# Patient Record
Sex: Female | Born: 1975 | Race: White | Hispanic: No | Marital: Married | Smoking: Current every day smoker
Health system: Southern US, Community
[De-identification: ages and names within clinical notes are randomized; demographics above are authoritative.]

## PROBLEM LIST (undated history)

## (undated) DIAGNOSIS — I82409 Acute embolism and thrombosis of unspecified deep veins of unspecified lower extremity: Secondary | ICD-10-CM

## (undated) DIAGNOSIS — M419 Scoliosis, unspecified: Secondary | ICD-10-CM

## (undated) DIAGNOSIS — N390 Urinary tract infection, site not specified: Secondary | ICD-10-CM

## (undated) DIAGNOSIS — G43909 Migraine, unspecified, not intractable, without status migrainosus: Secondary | ICD-10-CM

## (undated) DIAGNOSIS — I493 Ventricular premature depolarization: Secondary | ICD-10-CM

## (undated) HISTORY — PX: CERVICAL CONE BIOPSY: SUR198

## (undated) HISTORY — PX: OTHER SURGICAL HISTORY: SHX169

## (undated) HISTORY — PX: TUBAL LIGATION: SHX77

## (undated) HISTORY — PX: TYMPANOSTOMY TUBE PLACEMENT: SHX32

---

## 2016-05-06 DIAGNOSIS — B373 Candidiasis of vulva and vagina: Secondary | ICD-10-CM | POA: Insufficient documentation

## 2016-05-06 DIAGNOSIS — Z9622 Myringotomy tube(s) status: Secondary | ICD-10-CM | POA: Insufficient documentation

## 2016-05-06 DIAGNOSIS — N390 Urinary tract infection, site not specified: Secondary | ICD-10-CM | POA: Insufficient documentation

## 2016-05-06 DIAGNOSIS — Z86718 Personal history of other venous thrombosis and embolism: Secondary | ICD-10-CM | POA: Insufficient documentation

## 2016-05-06 LAB — URINALYSIS COMPLETE WITH MICROSCOPIC (ARMC ONLY)
Bilirubin Urine: NEGATIVE
GLUCOSE, UA: NEGATIVE mg/dL
Ketones, ur: NEGATIVE mg/dL
Leukocytes, UA: NEGATIVE
Nitrite: NEGATIVE
Protein, ur: NEGATIVE mg/dL
Specific Gravity, Urine: 1.021 (ref 1.005–1.030)
pH: 5 (ref 5.0–8.0)

## 2016-05-06 NOTE — ED Notes (Signed)
Patient states she has a urinary tract infection and that she has them regularly and knows that this is what it is. Patient with urgency, frequency and dysuria. Husband states that he thinks she might have a yeast infection also. 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

## 2016-05-07 ENCOUNTER — Emergency Department
Admission: EM | Admit: 2016-05-07 | Discharge: 2016-05-07 | Disposition: A | Discharge status: Home / Self Care | Payer: Self-pay | Attending: Emergency Medicine | Admitting: Emergency Medicine

## 2016-05-07 DIAGNOSIS — N390 Urinary tract infection, site not specified: Secondary | ICD-10-CM

## 2016-05-07 DIAGNOSIS — B373 Candidiasis of vulva and vagina: Secondary | ICD-10-CM

## 2016-05-07 DIAGNOSIS — B3731 Acute candidiasis of vulva and vagina: Secondary | ICD-10-CM

## 2016-05-07 HISTORY — DX: Acute embolism and thrombosis of unspecified deep veins of unspecified lower extremity: I82.409

## 2016-05-07 HISTORY — DX: Ventricular premature depolarization: I49.3

## 2016-05-07 HISTORY — DX: Urinary tract infection, site not specified: N39.0

## 2016-05-07 HISTORY — DX: Scoliosis, unspecified: M41.9

## 2016-05-07 HISTORY — DX: Migraine, unspecified, not intractable, without status migrainosus: G43.909

## 2016-05-07 MED ORDER — CEPHALEXIN 500 MG PO CAPS: 500.0000 mg | ORAL_CAPSULE | Freq: Two times a day (BID) | ORAL | Status: AC

## 2016-05-07 MED ORDER — FLUCONAZOLE 50 MG PO TABS
150.0000 mg | ORAL_TABLET | Freq: Once | ORAL | Status: AC
  Administered 2016-05-07: 150 mg via ORAL
  Filled 2016-05-07: qty 1

## 2016-05-07 MED ORDER — LORAZEPAM 0.5 MG PO TABS
0.5000 mg | ORAL_TABLET | Freq: Once | ORAL | Status: AC
  Administered 2016-05-07: 0.5 mg via ORAL
  Filled 2016-05-07: qty 1

## 2016-05-07 MED ORDER — CEPHALEXIN 500 MG PO CAPS
500.0000 mg | ORAL_CAPSULE | Freq: Once | ORAL | Status: AC
  Administered 2016-05-07: 500 mg via ORAL
  Filled 2016-05-07: qty 1

## 2016-05-07 NOTE — ED Provider Notes (Signed)
[facility] Emergency Department Provider Note  ____________________________________________  Time seen: 2:45 AM   I have reviewed the triage vital signs and the nursing notes.   HISTORY  Chief Complaint Urinary Tract Infection      HPI [name] Evans is a 40 y.o. female presents with dysuria and urinary urgency and frequency times "few days". Patient states that she's had multiple urinary tract infections in the past and this is consistent with that. Patient also states that she has a yeast infection    Past Medical History  Diagnosis Date  . Frequent UTI   . Scoliosis   . DVT (deep venous thrombosis) (HCC)   . Frequent PVCs   . Migraines     There are no active problems to display for this patient.   Past Surgical History  Procedure Laterality Date  . Tubal ligation    . Uterine ablation    . Ivc filter, removed    . Cervical cone biopsy    . Tympanostomy tube placement      Current Outpatient Rx  Name  Route  Sig  Dispense  Refill  . cephALEXin (KEFLEX) 500 MG capsule   Oral   Take 1 capsule (500 mg total) by mouth 2 (two) times daily.   14 capsule   0     Allergies Tramadol  No family history on file.  Social History Social History  Substance Use Topics  . Smoking status: None  . Smokeless tobacco: None  . Alcohol Use: None    Review of Systems  Constitutional: Negative for fever. Eyes: Negative for visual changes. ENT: Negative for sore throat. Cardiovascular: Negative for chest pain. Respiratory: Negative for shortness of breath. Gastrointestinal: Negative for abdominal pain, vomiting and diarrhea. Genitourinary: Positive for dysuria or urinary frequency and urgency Musculoskeletal: Negative for back pain. Skin: Negative for rash. Neurological: Negative for headaches, focal weakness or numbness.   10-point ROS otherwise negative.  ____________________________________________   PHYSICAL EXAM:  VITAL  SIGNS: ED Triage Vitals  Enc Vitals Group     BP 05/06/16 2313 143/89 mmHg     Pulse Rate 05/06/16 2313 99     Resp 05/06/16 2313 20     Temp 05/06/16 2313 98.3 F (36.8 C)     Temp Source 05/06/16 2313 Oral     SpO2 05/06/16 2313 96 %     Weight 05/06/16 2313 132 lb (59.875 kg)     Height 05/06/16 2313 5\' 4"  (1.626 m)     Head Cir --      Peak Flow --      Pain Score 05/06/16 2314 7     Pain Loc --      Pain Edu? --      Excl. in GC? --     Constitutional: Alert and oriented. Well appearing and in no distress. Eyes: Conjunctivae are normal. PERRL. Normal extraocular movements. ENT   Head: Normocephalic and atraumatic.   Nose: No congestion/rhinnorhea.   Mouth/Throat: Mucous membranes are moist.   Neck: No stridor. Hematological/Lymphatic/Immunilogical: No cervical lymphadenopathy. Cardiovascular: Normal rate, regular rhythm. Normal and symmetric distal pulses are present in all extremities. No murmurs, rubs, or gallops. Respiratory: Normal respiratory effort without tachypnea nor retractions. Breath sounds are clear and equal bilaterally. No wheezes/rales/rhonchi. Gastrointestinal: Soft and nontender. No distention. There is no CVA tenderness. Genitourinary: deferred Musculoskeletal: Nontender with normal range of motion in all extremities. No joint effusions.  No lower extremity tenderness nor edema. Neurologic:  Normal speech  and language. No gross focal neurologic deficits are appreciated. Speech is normal.  Skin:  Skin is warm, dry and intact. No rash noted. Psychiatric: Mood and affect are normal. Speech and behavior are normal. Patient exhibits appropriate insight and judgment.  ____________________________________________    LABS (pertinent positives/negatives)  Labs Reviewed  URINALYSIS COMPLETEWITH MICROSCOPIC ([facility] ONLY) - Abnormal; Notable for the following:    Color, Urine YELLOW (*)    APPearance HAZY (*)    Hgb urine dipstick 1+ (*)     Bacteria, UA MANY (*)    Squamous Epithelial / LPF 0-5 (*)    All other components within normal limits  POC URINE PREG, ED        INITIAL IMPRESSION / ASSESSMENT AND PLAN / ED COURSE  Pertinent labs & imaging results that were available during my care of the patient were reviewed by me and considered in my medical decision making (see chart for details).    ____________________________________________   FINAL CLINICAL IMPRESSION(S) / ED DIAGNOSES  Final diagnoses:  UTI (lower urinary tract infection)  Yeast vaginitis      Darci Currentandolph N Laniesha Das, MD 05/07/16 [id number]

## 2016-05-07 NOTE — Discharge Instructions (Signed)
Urinary Tract Infection °Urinary tract infections (UTIs) can develop anywhere along your urinary tract. Your urinary tract is your body's drainage system for removing wastes and extra water. Your urinary tract includes two kidneys, two ureters, a bladder, and a urethra. Your kidneys are a pair of bean-shaped organs. Each kidney is about the size of your fist. They are located below your ribs, one on each side of your spine. °CAUSES °Infections are caused by microbes, which are microscopic organisms, including fungi, viruses, and bacteria. These organisms are so small that they can only be seen through a microscope. Bacteria are the microbes that most commonly cause UTIs. °SYMPTOMS  °Symptoms of UTIs may vary by age and gender of the patient and by the location of the infection. Symptoms in young women typically include a frequent and intense urge to urinate and a painful, burning feeling in the bladder or urethra during urination. Older women and men are more likely to be tired, shaky, and weak and have muscle aches and abdominal pain. A fever may mean the infection is in your kidneys. Other symptoms of a kidney infection include pain in your back or sides below the ribs, nausea, and vomiting. °DIAGNOSIS °To diagnose a UTI, your caregiver will ask you about your symptoms. Your caregiver will also ask you to provide a urine sample. The urine sample will be tested for bacteria and white blood cells. White blood cells are made by your body to help fight infection. °TREATMENT  °Typically, UTIs can be treated with medication. Because most UTIs are caused by a bacterial infection, they usually can be treated with the use of antibiotics. The choice of antibiotic and length of treatment depend on your symptoms and the type of bacteria causing your infection. °HOME CARE INSTRUCTIONS °· If you were prescribed antibiotics, take them exactly as your caregiver instructs you. Finish the medication even if you feel better after  you have only taken some of the medication. °· Drink enough water and fluids to keep your urine clear or pale yellow. °· Avoid caffeine, tea, and carbonated beverages. They tend to irritate your bladder. °· Empty your bladder often. Avoid holding urine for long periods of time. °· Empty your bladder before and after sexual intercourse. °· After a bowel movement, women should cleanse from front to back. Use each tissue only once. °SEEK MEDICAL CARE IF:  °· You have back pain. °· You develop a fever. °· Your symptoms do not begin to resolve within 3 days. °SEEK IMMEDIATE MEDICAL CARE IF:  °· You have severe back pain or lower abdominal pain. °· You develop chills. °· You have nausea or vomiting. °· You have continued burning or discomfort with urination. °MAKE SURE YOU:  °· Understand these instructions. °· Will watch your condition. °· Will get help right away if you are not doing well or get worse. °  °This information is not intended to replace advice given to you by your health care provider. Make sure you discuss any questions you have with your health care provider. °  °Document Released: 08/12/2005 Document Revised: 07/24/2015 Document Reviewed: 12/11/2011 °Elsevier Interactive Patient Education ©2016 [employer] ° °Monilial Vaginitis °Vaginitis in a soreness, swelling and redness (inflammation) of the vagina and vulva. Monilial vaginitis is not a sexually transmitted infection. °CAUSES  °Yeast vaginitis is caused by yeast (candida) that is normally found in your vagina. With a yeast infection, the candida has overgrown in number to a point that upsets the chemical balance. °SYMPTOMS  °·   White, thick vaginal discharge. °· Swelling, itching, redness and irritation of the vagina and possibly the lips of the vagina (vulva). °· Burning or painful urination. °· Painful intercourse. °DIAGNOSIS  °Things that may contribute to monilial vaginitis are: °· Postmenopausal and virginal  states. °· Pregnancy. °· Infections. °· Being tired, sick or stressed, especially if you had monilial vaginitis in the past. °· Diabetes. Good control will help lower the chance. °· Birth control pills. °· Tight fitting garments. °· Using bubble bath, feminine sprays, douches or deodorant tampons. °· Taking certain medications that kill germs (antibiotics). °· Sporadic recurrence can occur if you become ill. °TREATMENT  °Your caregiver will give you medication. °· There are several kinds of anti monilial vaginal creams and suppositories specific for monilial vaginitis. For recurrent yeast infections, use a suppository or cream in the vagina 2 times a week, or as directed. °· Anti-monilial or steroid cream for the itching or irritation of the vulva may also be used. Get your caregiver's permission. °· Painting the vagina with methylene blue solution may help if the monilial cream does not work. °· Eating yogurt may help prevent monilial vaginitis. °HOME CARE INSTRUCTIONS  °· Finish all medication as prescribed. °· Do not have sex until treatment is completed or after your caregiver tells you it is okay. °· Take warm sitz baths. °· Do not douche. °· Do not use tampons, especially scented ones. °· Wear cotton underwear. °· Avoid tight pants and panty hose. °· Tell your sexual partner that you have a yeast infection. They should go to their caregiver if they have symptoms such as mild rash or itching. °· Your sexual partner should be treated as well if your infection is difficult to eliminate. °· Practice safer sex. Use condoms. °· Some vaginal medications cause latex condoms to fail. Vaginal medications that harm condoms are: °¨ Cleocin cream. °¨ Butoconazole (Femstat®). °¨ Terconazole (Terazol®) vaginal suppository. °¨ Miconazole (Monistat®) (may be purchased over the counter). °SEEK MEDICAL CARE IF:  °· You have a temperature by mouth above 102° F (38.9° C). °· The infection is getting worse after 2 days of  treatment. °· The infection is not getting better after 3 days of treatment. °· You develop blisters in or around your vagina. °· You develop vaginal bleeding, and it is not your menstrual period. °· You have pain when you urinate. °· You develop intestinal problems. °· You have pain with sexual intercourse. °  °This information is not intended to replace advice given to you by your health care provider. Make sure you discuss any questions you have with your health care provider. °  °Document Released: 08/12/2005 Document Revised: 01/25/2012 Document Reviewed: 05/06/2015 °Elsevier Interactive Patient Education ©2016 Elsevier Inc. ° °

## 2016-05-07 NOTE — ED Notes (Addendum)
Pt states "I have a UTI, I get a lot of them and kidney infections." Pt states frequency, pain with urination, back pain, "having to go even when I don't need to go". Pt states she had acute kidney failure before.   Pt also states she wants to be checked for her "nerves" because she chews on her hands and chiggers that are on legs, back, and abdomen.

## 2016-05-27 ENCOUNTER — Emergency Department: Payer: Self-pay

## 2016-05-27 ENCOUNTER — Encounter: Payer: Self-pay | Admitting: Emergency Medicine

## 2016-05-27 ENCOUNTER — Emergency Department
Admission: EM | Admit: 2016-05-27 | Discharge: 2016-05-27 | Disposition: A | Discharge status: Home / Self Care | Payer: Self-pay | Attending: Emergency Medicine | Admitting: Emergency Medicine

## 2016-05-27 DIAGNOSIS — Z86718 Personal history of other venous thrombosis and embolism: Secondary | ICD-10-CM | POA: Insufficient documentation

## 2016-05-27 DIAGNOSIS — R102 Pelvic and perineal pain: Secondary | ICD-10-CM | POA: Insufficient documentation

## 2016-05-27 DIAGNOSIS — R109 Unspecified abdominal pain: Secondary | ICD-10-CM

## 2016-05-27 DIAGNOSIS — R112 Nausea with vomiting, unspecified: Secondary | ICD-10-CM | POA: Insufficient documentation

## 2016-05-27 DIAGNOSIS — M419 Scoliosis, unspecified: Secondary | ICD-10-CM | POA: Insufficient documentation

## 2016-05-27 LAB — CBC WITH DIFFERENTIAL/PLATELET
BASOS ABS: 0 10*3/uL (ref 0–0.1)
BASOS PCT: 0 %
EOS ABS: 0.2 10*3/uL (ref 0–0.7)
Eosinophils Relative: 2 %
HCT: 41.1 % (ref 35.0–47.0)
HEMOGLOBIN: 13.9 g/dL (ref 12.0–16.0)
LYMPHS ABS: 2.8 10*3/uL (ref 1.0–3.6)
LYMPHS PCT: 36 %
MCH: 28.1 pg (ref 26.0–34.0)
MCHC: 33.7 g/dL (ref 32.0–36.0)
MCV: 83.4 fL (ref 80.0–100.0)
MONOS PCT: 12 %
Monocytes Absolute: 0.9 10*3/uL (ref 0.2–0.9)
NEUTROS ABS: 3.8 10*3/uL (ref 1.4–6.5)
NEUTROS PCT: 50 %
Platelets: 153 10*3/uL (ref 150–440)
RBC: 4.94 MIL/uL (ref 3.80–5.20)
RDW: 13.9 % (ref 11.5–14.5)
WBC: 7.7 10*3/uL (ref 3.6–11.0)

## 2016-05-27 LAB — COMPREHENSIVE METABOLIC PANEL
ALBUMIN: 4.2 g/dL (ref 3.5–5.0)
ALK PHOS: 64 U/L (ref 38–126)
ALT: 167 U/L — ABNORMAL HIGH (ref 14–54)
AST: 85 U/L — AB (ref 15–41)
Anion gap: 6 (ref 5–15)
BUN: 14 mg/dL (ref 6–20)
CO2: 24 mmol/L (ref 22–32)
Calcium: 8.8 mg/dL — ABNORMAL LOW (ref 8.9–10.3)
Chloride: 108 mmol/L (ref 101–111)
Creatinine, Ser: 0.99 mg/dL (ref 0.44–1.00)
GFR calc Af Amer: >60 mL/min (ref >60)
GFR calc non Af Amer: >60 mL/min (ref >60)
Glucose, Bld: 99 mg/dL (ref 65–99)
POTASSIUM: 3.8 mmol/L (ref 3.5–5.1)
SODIUM: 138 mmol/L (ref 135–145)
Total Bilirubin: 0.5 mg/dL (ref 0.3–1.2)
Total Protein: 7.8 g/dL (ref 6.5–8.1)

## 2016-05-27 LAB — URINALYSIS COMPLETE WITH MICROSCOPIC (ARMC ONLY)
Bilirubin Urine: NEGATIVE
Glucose, UA: NEGATIVE mg/dL
HGB URINE DIPSTICK: NEGATIVE
NITRITE: NEGATIVE
PH: 5 (ref 5.0–8.0)
PROTEIN: NEGATIVE mg/dL
SPECIFIC GRAVITY, URINE: 1.03 (ref 1.005–1.030)

## 2016-05-27 LAB — URINE DRUG SCREEN, QUALITATIVE (ARMC ONLY)
Amphetamines, Ur Screen: NOT DETECTED
BARBITURATES, UR SCREEN: NOT DETECTED
BENZODIAZEPINE, UR SCRN: NOT DETECTED
CANNABINOID 50 NG, UR ~~LOC~~: NOT DETECTED
Cocaine Metabolite,Ur ~~LOC~~: NOT DETECTED
MDMA (Ecstasy)Ur Screen: NOT DETECTED
Methadone Scn, Ur: NOT DETECTED
Opiate, Ur Screen: POSITIVE — AB
Phencyclidine (PCP) Ur S: NOT DETECTED
TRICYCLIC, UR SCREEN: NOT DETECTED

## 2016-05-27 LAB — LIPASE, BLOOD: Lipase: 41 U/L (ref 11–51)

## 2016-05-27 MED ORDER — MORPHINE SULFATE (PF) 4 MG/ML IV SOLN
4.0000 mg | Freq: Once | INTRAVENOUS | Status: AC
  Administered 2016-05-27: 4 mg via INTRAVENOUS

## 2016-05-27 MED ORDER — MORPHINE SULFATE (PF) 4 MG/ML IV SOLN
4.0000 mg | Freq: Once | INTRAVENOUS | Status: AC
  Administered 2016-05-27: 4 mg via INTRAVENOUS
  Filled 2016-05-27: qty 1

## 2016-05-27 MED ORDER — GADOBENATE DIMEGLUMINE 529 MG/ML IV SOLN
15.0000 mL | Freq: Once | INTRAVENOUS | Status: AC | PRN
  Administered 2016-05-27: 11 mL via INTRAVENOUS

## 2016-05-27 MED ORDER — ONDANSETRON HCL 4 MG/2ML IJ SOLN
INTRAMUSCULAR | Status: AC
  Administered 2016-05-27: 4 mg via INTRAVENOUS
  Filled 2016-05-27: qty 2

## 2016-05-27 MED ORDER — HYDROMORPHONE HCL 1 MG/ML IJ SOLN
1.0000 mg | Freq: Once | INTRAMUSCULAR | Status: AC
  Administered 2016-05-27: 1 mg via INTRAVENOUS
  Filled 2016-05-27: qty 1

## 2016-05-27 MED ORDER — ONDANSETRON HCL 4 MG/2ML IJ SOLN
4.0000 mg | Freq: Once | INTRAMUSCULAR | Status: AC
  Administered 2016-05-27: 4 mg via INTRAVENOUS

## 2016-05-27 MED ORDER — SODIUM CHLORIDE 0.9 % IV BOLUS (SEPSIS)
1000.0000 mL | Freq: Once | INTRAVENOUS | Status: AC
  Administered 2016-05-27: 1000 mL via INTRAVENOUS

## 2016-05-27 MED ORDER — MORPHINE SULFATE (PF) 4 MG/ML IV SOLN
INTRAVENOUS | Status: AC
  Administered 2016-05-27: 4 mg via INTRAVENOUS
  Filled 2016-05-27: qty 1

## 2016-05-27 MED ORDER — OXYCODONE-ACETAMINOPHEN 5-325 MG PO TABS: 1.0000 | ORAL_TABLET | ORAL | Status: DC | PRN

## 2016-05-27 MED ORDER — DIATRIZOATE MEGLUMINE & SODIUM 66-10 % PO SOLN
15.0000 mL | Freq: Once | ORAL | Status: AC
  Administered 2016-05-27: 15 mL via ORAL

## 2016-05-27 MED ORDER — LORAZEPAM 2 MG/ML IJ SOLN
1.0000 mg | Freq: Once | INTRAMUSCULAR | Status: AC
  Administered 2016-05-27: 1 mg via INTRAVENOUS
  Filled 2016-05-27: qty 1

## 2016-05-27 MED ORDER — IOPAMIDOL (ISOVUE-300) INJECTION 61%
100.0000 mL | Freq: Once | INTRAVENOUS | Status: AC | PRN
  Administered 2016-05-27: 100 mL via INTRAVENOUS

## 2016-05-27 MED ORDER — ONDANSETRON HCL 4 MG/2ML IJ SOLN
4.0000 mg | Freq: Once | INTRAMUSCULAR | Status: AC
  Administered 2016-05-27: 4 mg via INTRAVENOUS
  Filled 2016-05-27: qty 2

## 2016-05-27 NOTE — Discharge Instructions (Signed)

## 2016-05-27 NOTE — ED Notes (Signed)
Patient transported to MRI 

## 2016-05-27 NOTE — ED Notes (Addendum)
Patient presents to ED with c/o mid abdominal pain radiating now to RLQ for 3 days. (+) nausea and vomiting.Pt reports sharp pain radiating to back. Pt reports pain has been been increasing today.

## 2016-05-27 NOTE — ED Provider Notes (Signed)
[place] [facility] Emergency Department Provider Note  ____________________________________________  Time seen: 12:45 AM  I have reviewed the triage vital signs and the nursing notes.   HISTORY  Chief Complaint Abdominal Pain      HPI [name] Evans is a 40 y.o. female presents with 10 out of 10 right lower quadrant abdominal pain 3 days accompanied by nausea and vomiting. Patient states that the pain is sharp and radiates to her back. Patient denies any fever. Patient afebrile on presentation temperature 98.1 tachycardic with heart rate of 128. Patient denies any diarrhea no constipation. Patient denies any urinary symptoms no urinary frequency urgency or dysuria.     Past Medical History  Diagnosis Date  . Frequent UTI   . Scoliosis   . DVT (deep venous thrombosis) (HCC)   . Frequent PVCs   . Migraines     There are no active problems to display for this patient.   Past Surgical History  Procedure Laterality Date  . Tubal ligation    . Uterine ablation    . Ivc filter, removed    . Cervical cone biopsy    . Tympanostomy tube placement      No current outpatient prescriptions on file.  Allergies Toradol and Tramadol  No family history on file.  Social History Social History  Substance Use Topics  . Smoking status: Never Smoker   . Smokeless tobacco: None  . Alcohol Use: No    Review of Systems  Constitutional: Negative for fever. Eyes: Negative for visual changes. ENT: Negative for sore throat. Cardiovascular: Negative for chest pain. Respiratory: Negative for shortness of breath. Gastrointestinal: Positive for abdominal pain and vomiting Genitourinary: Negative for dysuria. Musculoskeletal: Negative for back pain. Skin: Negative for rash. Neurological: Negative for headaches, focal weakness or numbness.  10-point ROS otherwise negative.  ____________________________________________   PHYSICAL EXAM:  VITAL SIGNS: ED  Triage Vitals  Enc Vitals Group     BP 05/27/16 0037 139/96 mmHg     Pulse Rate 05/27/16 0037 128     Resp 05/27/16 0037 18     Temp 05/27/16 0037 98.1 F (36.7 C)     Temp Source 05/27/16 0037 Oral     SpO2 05/27/16 0037 98 %     Weight 05/27/16 0037 127 lb (57.607 kg)     Height 05/27/16 0037 5\' 5"  (1.651 m)     Head Cir --      Peak Flow --      Pain Score 05/27/16 0038 10     Pain Loc --      Pain Edu? --      Excl. in GC? --      Constitutional: Alert and oriented. Apparent discomfort Eyes: Conjunctivae are normal. PERRL. Normal extraocular movements. ENT   Head: Normocephalic and atraumatic.   Nose: No congestion/rhinnorhea.   Mouth/Throat: Mucous membranes are moist.   Neck: No stridor. Hematological/Lymphatic/Immunilogical: No cervical lymphadenopathy. Cardiovascular: Normal rate, regular rhythm. Normal and symmetric distal pulses are present in all extremities. No murmurs, rubs, or gallops. Respiratory: Normal respiratory effort without tachypnea nor retractions. Breath sounds are clear and equal bilaterally. No wheezes/rales/rhonchi. Gastrointestinal: Right lower quadrant tenderness to palpation. No distention. There is no CVA tenderness. Genitourinary: deferred Musculoskeletal: Nontender with normal range of motion in all extremities. No joint effusions.  No lower extremity tenderness nor edema. Neurologic:  Normal speech and language. No gross focal neurologic deficits are appreciated. Speech is normal.  Skin:  Skin is warm, dry and  intact. No rash noted. Psychiatric: Mood and affect are normal. Speech and behavior are normal. Patient exhibits appropriate insight and judgment.  ____________________________________________    LABS (pertinent positives/negatives) Labs Reviewed  COMPREHENSIVE METABOLIC PANEL - Abnormal; Notable for the following:    Calcium 8.8 (*)    AST 85 (*)    ALT 167 (*)    All other components within normal limits  URINALYSIS  COMPLETEWITH MICROSCOPIC ([facility] ONLY) - Abnormal; Notable for the following:    Color, Urine YELLOW (*)    APPearance HAZY (*)    Ketones, ur TRACE (*)    Leukocytes, UA TRACE (*)    Bacteria, UA RARE (*)    Squamous Epithelial / LPF 6-30 (*)    All other components within normal limits  URINE DRUG SCREEN, QUALITATIVE ([facility] ONLY) - Abnormal; Notable for the following:    Opiate, Ur Screen POSITIVE (*)    All other components within normal limits  CBC WITH DIFFERENTIAL/PLATELET  LIPASE, BLOOD       RADIOLOGY     MR Abd W/Wo Cm/MRCP (Final result) Result time: 05/27/16 08:51:30   Procedure changed from MR Abdomen Wo Contrast      Final result by Rad Results In Interface (05/27/16 08:51:30)   Narrative:   CLINICAL DATA: Mild intrahepatic/extrahepatic ductal dilatation on CT  EXAM: MRI ABDOMEN WITHOUT AND WITH CONTRAST (INCLUDING MRCP)  TECHNIQUE: Multiplanar multisequence MR imaging of the abdomen was performed both before and after the administration of intravenous contrast. Heavily T2-weighted images of the biliary and pancreatic ducts were obtained, and three-dimensional MRCP images were rendered by post processing.  CONTRAST: 11mL MULTIHANCE GADOBENATE DIMEGLUMINE 529 MG/ML IV SOLN  COMPARISON: CT abdomen pelvis dated 05/27/2016 at 0213 hours  FINDINGS: Lower chest: Trace right pleural fluid.  Hepatobiliary: Liver is within normal limits. No suspicious/enhancing hepatic lesions. No hepatic steatosis.  Gallbladder is unremarkable. No intrahepatic ductal dilatation. Common duct measures 6 mm, within normal limits.  Pancreas: 6 mm nonenhancing cystic lesion in the pancreatic tail (series 201/ image 13).  Spleen: Within normal limits.  Adrenals/Urinary Tract: Adrenal glands are within normal limits.  7 mm right lower pole renal cyst (series 2/image 28). Kidneys are otherwise within normal limits. No hydronephrosis.  Stomach/Bowel: Stomach is within  normal limits.  Visualized bowel is unremarkable.  Vascular/Lymphatic: No evidence abdominal aortic aneurysm.  No suspicious abdominal lymphadenopathy.  Other: No abdominal ascites. Or  Musculoskeletal: No focal osseous lesions.  IMPRESSION: No intrahepatic or extrahepatic ductal dilatation.  Pancreatic head is within normal limits on MRI. 6 mm nonenhancing cystic lesion in the pancreatic tail, likely benign. If clinically warranted, a single follow-up MRI abdomen with/ without contrast can be performed in 12 months. This recommendation follows ACR consensus guidelines: Managing Incidental Findings on Abdominal CT: White Paper of the ACR Incidental Findings Committee. J Am Coll Radiol 2010;7:754-773  Trace right pleural effusion.   Electronically Signed By: Charline BillsSriyesh Krishnan M.D. On: 05/27/2016 08:51          CT Abdomen Pelvis W Contrast (Final result) Result time: 05/27/16 02:24:23   Final result by Rad Results In Interface (05/27/16 02:24:23)   Narrative:   CLINICAL DATA: Mid abdominal pain radiating to the right lower quadrant for 3 days. Nausea and vomiting.  EXAM: CT ABDOMEN AND PELVIS WITH CONTRAST  TECHNIQUE: Multidetector CT imaging of the abdomen and pelvis was performed using the standard protocol following bolus administration of intravenous contrast.  CONTRAST: 100mL ISOVUE-300 IOPAMIDOL (ISOVUE-300) INJECTION 61%  COMPARISON: None.  FINDINGS: Lung bases are clear.  There is moderate intra and extrahepatic bile duct dilatation. No radiopaque stones are demonstrated in the common bile duct. The head of the pancreas appear somewhat prominent and heterogeneous possibly indicating a pancreatic mass. Mild pancreatic ductal dilatation. Circumscribed cystic lesion in the junction of the body and tail of the pancreas measuring about 6 mm diameter. This could represent a mucinous lesion. Further evaluation with MRI/ MRCP in the  elective setting is suggested. No focal liver lesions identified. Calcified granulomas in the spleen.  Adrenal glands, kidneys, abdominal aorta, inferior vena cava, and retroperitoneal lymph nodes are unremarkable. Stomach, small bowel, and colon are not abnormally distended. No free air or free fluid in the abdomen.  Pelvis: The appendix is not identified. Uterus is retroverted without enlargement. No abnormal adnexal masses. Probable physiologic cysts in the ovaries. Bladder wall is not thickened. No free or loculated pelvic fluid collections. No pelvic mass or lymphadenopathy. 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 No destructive bone lesions.  IMPRESSION: Intra and extrahepatic bile duct dilatation with mild pancreatic ductal dilatation. Head of the pancreas appears somewhat prominent. 6 mm cyst at the junction of the body and tail of the pancreas. Suggest further evaluation with elective MRI/MRCP. Appendix is not identified. Probable physiologic ovarian cysts. 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   Electronically Signed By: Burman Nieves M.D. On: 05/27/2016 02:24        ECG Results      Procedures     INITIAL IMPRESSION / ASSESSMENT AND PLAN / ED COURSE  Pertinent labs & imaging results that were available during my care of the patient were reviewed by me and considered in my medical decision making (see chart for details).    ____________________________________________   FINAL CLINICAL IMPRESSION(S) / ED DIAGNOSES  Final diagnoses:  Pelvic pain in female      Darci Current, MD 05/31/16 [phone]

## 2016-05-27 NOTE — ED Notes (Signed)
Pt discharged to home.  Family member driving.  Discharge instructions reviewed.  Verbalized understanding.  No questions or concerns at this time.  Teach back verified.  Pt in NAD.  No items left in ED.   

## 2016-06-22 ENCOUNTER — Encounter: Payer: Self-pay | Admitting: Emergency Medicine

## 2016-06-22 ENCOUNTER — Emergency Department
Admission: EM | Admit: 2016-06-22 | Discharge: 2016-06-22 | Disposition: A | Discharge status: Home / Self Care | Payer: Self-pay | Attending: Emergency Medicine | Admitting: Emergency Medicine

## 2016-06-22 DIAGNOSIS — H6092 Unspecified otitis externa, left ear: Secondary | ICD-10-CM | POA: Insufficient documentation

## 2016-06-22 DIAGNOSIS — L02414 Cutaneous abscess of left upper limb: Secondary | ICD-10-CM | POA: Insufficient documentation

## 2016-06-22 MED ORDER — SULFAMETHOXAZOLE-TRIMETHOPRIM 800-160 MG PO TABS: 1.0000 | ORAL_TABLET | Freq: Two times a day (BID) | ORAL | 0 refills | Status: DC

## 2016-06-22 MED ORDER — CIPROFLOXACIN-HYDROCORTISONE 0.2-1 % OT SUSP: 4.0000 [drp] | Freq: Two times a day (BID) | OTIC | 0 refills | Status: AC

## 2016-06-22 MED ORDER — OXYCODONE-ACETAMINOPHEN 5-325 MG PO TABS
1.0000 | ORAL_TABLET | Freq: Once | ORAL | Status: AC
  Administered 2016-06-22: 1 via ORAL

## 2016-06-22 MED ORDER — OXYCODONE-ACETAMINOPHEN 5-325 MG PO TABS
1.0000 | ORAL_TABLET | Freq: Once | ORAL | Status: AC
  Administered 2016-06-22: 1 via ORAL
  Filled 2016-06-22: qty 1

## 2016-06-22 MED ORDER — OXYCODONE-ACETAMINOPHEN 5-325 MG PO TABS
ORAL_TABLET | ORAL | Status: AC
  Administered 2016-06-22: 1 via ORAL
  Filled 2016-06-22: qty 1

## 2016-06-22 MED ORDER — OXYCODONE-ACETAMINOPHEN 7.5-325 MG PO TABS: 1.0000 | ORAL_TABLET | ORAL | 0 refills | Status: DC | PRN

## 2016-06-22 NOTE — ED Provider Notes (Signed)
[facility] Emergency Department Provider Note   ____________________________________________   First MD Initiated Contact with Patient 06/22/16 1318     (approximate)  I have reviewed the triage vital signs and the nursing notes.   HISTORY  Chief Complaint Arm Pain    HPI [name] Evans is a 40 y.o. female patient complaining of left wrist and forearm pain for 2 days. Patient notes increased redness with date edema. Patient stated they were moving furniture from a storage shed 3 days ago she was bit by an insect. Patient also complained of having left ear pain. Patient state she had a plug of wax which she removed and noticed foul smelling discharge. Patient denies any hearing loss. Patient denies any vertical with this complaint. No palliative measures for it or arm complaint. Patient rated her arm pain as a 7/10. Patient described a pain as sharp. Patient rates her ear pain as a 5/10. She describes the pain as "achy".  Past Medical History:  Diagnosis Date  . DVT (deep venous thrombosis) (HCC)   . Frequent PVCs   . Frequent UTI   . Migraines   . Scoliosis     There are no active problems to display for this patient.   Past Surgical History:  Procedure Laterality Date  . CERVICAL CONE BIOPSY    . IVC filter, removed    . TUBAL LIGATION    . TYMPANOSTOMY TUBE PLACEMENT    . Uterine ablation      Prior to Admission medications   Medication Sig Start Date End Date Taking? Authorizing Provider  ciprofloxacin-hydrocortisone (CIPRO HC) otic suspension Place 4 drops into the left ear 2 (two) times daily. 06/22/16 06/29/16  Joni Reining, PA-C  oxyCODONE-acetaminophen (PERCOCET) 7.5-325 MG tablet Take 1 tablet by mouth every 4 (four) hours as needed for severe pain. 06/22/16   Joni Reining, PA-C  oxyCODONE-acetaminophen (ROXICET) 5-325 MG tablet Take 1 tablet by mouth every 4 (four) hours as needed for severe pain. 05/27/16   Darci Current, MD    sulfamethoxazole-trimethoprim (BACTRIM DS,SEPTRA DS) 800-160 MG tablet Take 1 tablet by mouth 2 (two) times daily. 06/22/16   Joni Reining, PA-C    Allergies Toradol [ketorolac tromethamine] and Tramadol  No family history on file.  Social History Social History  Substance Use Topics  . Smoking status: Never Smoker  . Smokeless tobacco: Never Used  . Alcohol use No    Review of Systems Constitutional: No fever/chills Eyes: No visual changes. ENT: No sore throat. Left ear pain. Cardiovascular: Denies chest pain. Respiratory: Denies shortness of breath. Gastrointestinal: No abdominal pain.  No nausea, no vomiting.  No diarrhea.  No constipation. Genitourinary: Negative for dysuria. Musculoskeletal: Negative for back pain. Skin: Negative for rash. Swelling and pain to left forearm and wrist. Neurological: Negative for headaches, focal weakness or numbness. Allergic/Immunilogical: See medication list   ____________________________________________   PHYSICAL EXAM:  VITAL SIGNS: ED Triage Vitals  Enc Vitals Group     BP 06/22/16 1305 (!) 137/97     Pulse Rate 06/22/16 1305 89     Resp 06/22/16 1305 18     Temp 06/22/16 1305 98.2 F (36.8 C)     Temp Source 06/22/16 1305 Oral     SpO2 06/22/16 1305 100 %     Weight 06/22/16 1305 135 lb (61.2 kg)     Height 06/22/16 1305  (1.651 m)     Head Circumference --  Peak Flow --      Pain Score 06/22/16 1321 7     Pain Loc --      Pain Edu? --      Excl. in GC? --     Constitutional: Alert and oriented. Well appearing and in no acute distress. Eyes: Conjunctivae are normal. PERRL. EOMI. Head: Atraumatic. Nose: No congestion/rhinnorhea. EARS: Purulent left ear discharge. Mouth/Throat: Mucous membranes are moist.  Oropharynx non-erythematous. Neck: No stridor. No cervical spine tenderness to palpation. Hematological/Lymphatic/Immunilogical: No cervical lymphadenopathy. Cardiovascular: Normal rate, regular  rhythm. Grossly normal heart sounds.  Good peripheral circulation. Respiratory: Normal respiratory effort.  No retractions. Lungs CTAB. Gastrointestinal: Soft and nontender. No distention. No abdominal bruits. No CVA tenderness. Musculoskeletal: No lower extremity tenderness nor edema.  No joint effusions. Neurologic:  Normal speech and language. No gross focal neurologic deficits are appreciated. No gait instability. Skin:  Skin is warm, dry and intact. No rash noted. Psychiatric: Mood and affect are normal. Speech and behavior are normal.  ____________________________________________   LABS (all labs ordered are listed, but only abnormal results are displayed)  Labs Reviewed - No data to display ____________________________________________  EKG  ____________________________________________  RADIOLOGY   ____________________________________________   PROCEDURES  Procedure(s) performed: INCISION AND DRAINAGE Performed by: Joni Reiningonald K Arlita Buffkin Consent: Verbal consent obtained. Risks and benefits: risks, benefits and alternatives were discussed Type: abscess  Body area: Left forearm Anesthesia: local infiltration  Incision was made with a scalpel.  Local anesthetic: lidocaine 1% with epinephrine  Anesthetic total: 4 ML  Complexity: complex Blunt dissection to break up loculations  Drainage: purulent  Drainage amount: Large   Packing material: 1/4 in iodoform gauze  Patient tolerance: Patient tolerated the procedure well with no immediate complications.     Procedures  Critical Care performed: No  ____________________________________________   INITIAL IMPRESSION / ASSESSMENT AND PLAN / ED COURSE  Pertinent labs & imaging results that were available during my care of the patient were reviewed by me and considered in my medical decision making (see chart for details).  Otitis external left ear. Abscess left forearm. Patient given discharge care instructions.  Patient given a prescription for Bactrim DS and Cipro eardrops. Patient advised return back in 2 days for wound check.  Clinical Course     ____________________________________________   FINAL CLINICAL IMPRESSION(S) / ED DIAGNOSES  Final diagnoses:  Otitis externa, left  Abscess of left forearm      NEW MEDICATIONS STARTED DURING THIS VISIT:  New Prescriptions   CIPROFLOXACIN-HYDROCORTISONE (CIPRO HC) OTIC SUSPENSION    Place 4 drops into the left ear 2 (two) times daily.   OXYCODONE-ACETAMINOPHEN (PERCOCET) 7.5-325 MG TABLET    Take 1 tablet by mouth every 4 (four) hours as needed for severe pain.   SULFAMETHOXAZOLE-TRIMETHOPRIM (BACTRIM DS,SEPTRA DS) 800-160 MG TABLET    Take 1 tablet by mouth 2 (two) times daily.     Note:  This document was prepared using Dragon voice recognition software and may include unintentional dictation errors.    Joni Reiningonald K Sharrod Achille, PA-C 06/22/16 1422    Jene Everyobert Kinner, MD 06/22/16 [phone]

## 2016-06-22 NOTE — ED Triage Notes (Signed)
States she developed swelling to left wrist about 2 days ago.. Noticed a large red swollen area to lateral wrist area . Had been recently moving furniture from storage building  Not sure if she was bitten by something Also having left ear pain

## 2016-06-24 ENCOUNTER — Emergency Department
Admission: EM | Admit: 2016-06-24 | Discharge: 2016-06-24 | Disposition: A | Discharge status: Home / Self Care | Payer: Self-pay | Attending: Emergency Medicine | Admitting: Emergency Medicine

## 2016-06-24 DIAGNOSIS — Z5189 Encounter for other specified aftercare: Secondary | ICD-10-CM

## 2016-06-24 DIAGNOSIS — Z4801 Encounter for change or removal of surgical wound dressing: Secondary | ICD-10-CM | POA: Insufficient documentation

## 2016-06-24 MED ORDER — LIDOCAINE HCL (PF) 1 % IJ SOLN
INTRAMUSCULAR | Status: AC
  Filled 2016-06-24: qty 5

## 2016-06-24 NOTE — ED Provider Notes (Signed)
[facility] Emergency Department Provider Note   ____________________________________________   First MD Initiated Contact with Patient 06/24/16 1245     (approximate)  I have reviewed the triage vital signs and the nursing notes.   HISTORY  Chief Complaint Wound Check    HPI [name] Evans is a 40 y.o. female patient here. On check secondary to abscess which was I&D and packed 2 days ago. Abscess on the left forearm. Patient denies any increased edema erythema or drainage. Patient denies any pain at this time. Patient denies any loss of sensation or loss of function of the left upper extremity. Patient continue taking antibiotics pain medication as directed.  Past Medical History:  Diagnosis Date  . DVT (deep venous thrombosis) (HCC)   . Frequent PVCs   . Frequent UTI   . Migraines   . Scoliosis     There are no active problems to display for this patient.   Past Surgical History:  Procedure Laterality Date  . CERVICAL CONE BIOPSY    . IVC filter, removed    . TUBAL LIGATION    . TYMPANOSTOMY TUBE PLACEMENT    . Uterine ablation      Prior to Admission medications   Medication Sig Start Date End Date Taking? Authorizing Provider  ciprofloxacin-hydrocortisone (CIPRO HC) otic suspension Place 4 drops into the left ear 2 (two) times daily. 06/22/16 06/29/16  Joni Reining, PA-C  oxyCODONE-acetaminophen (PERCOCET) 7.5-325 MG tablet Take 1 tablet by mouth every 4 (four) hours as needed for severe pain. 06/22/16   Joni Reining, PA-C  oxyCODONE-acetaminophen (ROXICET) 5-325 MG tablet Take 1 tablet by mouth every 4 (four) hours as needed for severe pain. 05/27/16   Darci Current, MD  sulfamethoxazole-trimethoprim (BACTRIM DS,SEPTRA DS) 800-160 MG tablet Take 1 tablet by mouth 2 (two) times daily. 06/22/16   Joni Reining, PA-C    Allergies Toradol [ketorolac tromethamine] and Tramadol  No family history on file.  Social History Social History   Substance Use Topics  . Smoking status: Never Smoker  . Smokeless tobacco: Never Used  . Alcohol use No    Review of Systems Constitutional: No fever/chills Eyes: No visual changes. ENT: No sore throat. Cardiovascular: Denies chest pain. Respiratory: Denies shortness of breath. Gastrointestinal: No abdominal pain.  No nausea, no vomiting.  No diarrhea.  No constipation. Genitourinary: Negative for dysuria. Musculoskeletal: Negative for back pain. Skin: Negative for rash.Abscess left forearm Neurological: Negative for headaches, focal weakness or numbness.  .  ____________________________________________   PHYSICAL EXAM:  VITAL SIGNS: ED Triage Vitals [06/24/16 1228]  Enc Vitals Group     BP 126/86     Pulse Rate 86     Resp 16     Temp 98.4 F (36.9 C)     Temp Source Oral     SpO2 100 %     Weight 135 lb (61.2 kg)     Height      Head Circumference      Peak Flow      Pain Score 0     Pain Loc      Pain Edu?      Excl. in GC?     Constitutional: Alert and oriented. Well appearing and in no acute distress. Eyes: Conjunctivae are normal. PERRL. EOMI. Head: Atraumatic. Nose: No congestion/rhinnorhea. Mouth/Throat: Mucous membranes are moist.  Oropharynx non-erythematous. Neck: No stridor.  No cervical spine tenderness to palpation Hematological/Lymphatic/Immunilogical: No cervical lymphadenopathy. Cardiovascular: Normal rate,  regular rhythm. Grossly normal heart sounds.  Good peripheral circulation. Respiratory: Normal respiratory effort.  No retractions. Lungs CTAB. Gastrointestinal: Soft and nontender. No distention. No abdominal bruits. No CVA tenderness. Musculoskeletal: No lower extremity tenderness nor edema.  No joint effusions. Neurologic:  Normal speech and language. No gross focal neurologic deficits are appreciated. No gait instability. Skin: Wound was uncovered and packing material removed. Abscess was copious irrigated with clear return. Area was  cleaned and bandaged. Psychiatric: Mood and affect are normal. Speech and behavior are normal.  ____________________________________________   LABS (all labs ordered are listed, but only abnormal results are displayed)  Labs Reviewed - No data to display ____________________________________________  EKG   ____________________________________________  RADIOLOGY   ____________________________________________   PROCEDURES  Procedure(s) performed: None  Procedures  Critical Care performed: No  ____________________________________________   INITIAL IMPRESSION / ASSESSMENT AND PLAN / ED COURSE  Pertinent labs & imaging results that were available during my care of the patient were reviewed by me and considered in my medical decision making (see chart for details).  Healing abscess less warm. Patient given discharge care instructions. Patient advised continue antibiotics until completion return back to ER if condition worsens.  Clinical Course     ____________________________________________   FINAL CLINICAL IMPRESSION(S) / ED DIAGNOSES  Final diagnoses:  Wound check, abscess      NEW MEDICATIONS STARTED DURING THIS VISIT:  New Prescriptions   No medications on file     Note:  This document was prepared using Dragon voice recognition software and may include unintentional dictation errors.    Joni Reiningonald K Smith, PA-C 06/24/16 1307    Jennye MoccasinBrian S Quigley, MD 06/24/16 [phone]

## 2016-06-24 NOTE — ED Notes (Signed)
Pt to ed for wound recheck of left wrist.  Pt reports pain decreased and swelling decreased.

## 2016-06-24 NOTE — ED Triage Notes (Signed)
Patient has wrapped wound to left arm that was treated and packed 2 days ago in ED.  Patient was instructed to return to have area re-checked.  Patient denies any fever, swelling or issue with wound.  Patient is in no obvious distress at this time.  Ambulatory to triage.

## 2017-02-02 IMAGING — MR MR ABDOMEN WO/W CM MRCP
12 of 20 series · 27 of 48 positions shown · IV contrast (11 ML MULTIHANCE)
Comparison: CT abdomen pelvis dated 05/27/2016 at 9827 hours

CLINICAL DATA: Mild intrahepatic/extrahepatic ductal dilatation on
CT

EXAM:
MRI ABDOMEN WITHOUT AND WITH CONTRAST (INCLUDING MRCP)
TECHNIQUE: Multiplanar multisequence MR imaging of the abdomen was performed
both before and after the administration of intravenous contrast.
Heavily T2-weighted images of the biliary and pancreatic ducts were
obtained, and three-dimensional MRCP images were rendered by post
processing.
CONTRAST:  11mL MULTIHANCE GADOBENATE DIMEGLUMINE 529 MG/ML IV SOLN

[Series 2: T2 fat-sat · axial · 7.0mm · 0.74mm/px · 1 of 35 slices shown]
[im 1/35]
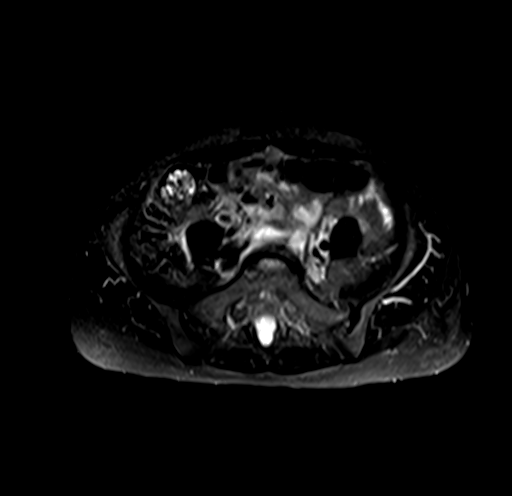

[Series 7: cor tru fisp · coronal · 4.0mm · 0.78mm/px · 1 of 45 slices shown]
[im 1/45]
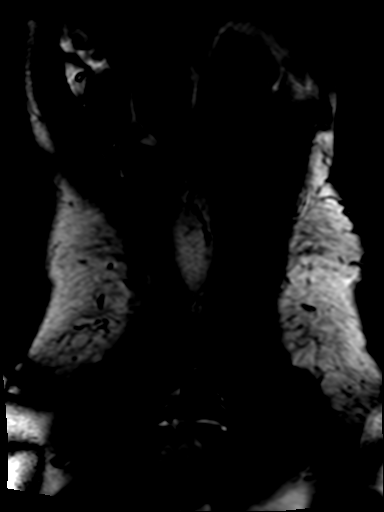

[Series 14: DWI · axial · 6.0mm · 1.77mm/px · z∈[-148,+154]mm · 3 of 129 slices shown]
[im 1/129]
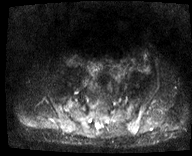
[im 65/129]
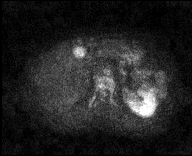
[im 129/129]
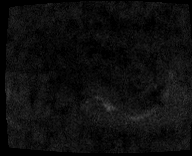

[Series 15: ax dwi_adc · axial · 6.0mm · 1.77mm/px · 1 of 43 slices shown]
[im 1/43]
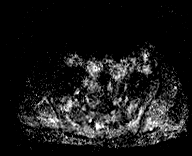

[Series 16: cor thins · coronal · 4.0mm · 0.89mm/px · 1 of 17 slices shown]
[im 1/17]
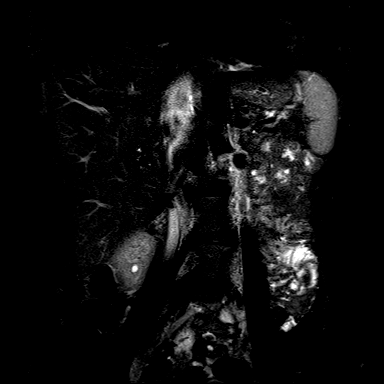

[Series 21: T2 · axial · 7.0mm · 0.74mm/px · 1 of 34 slices shown]
[im 1/34]
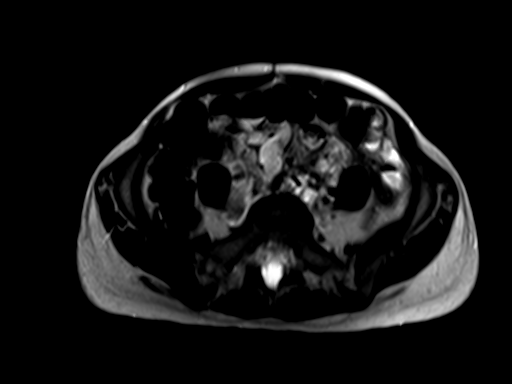

[Series 22: T1 dynamic fat-sat · axial · non-contrast · 2.5mm · 0.74mm/px · z∈[-136,+142]mm · 3 of 112 slices shown (1 of 2)]
[im 1/112]
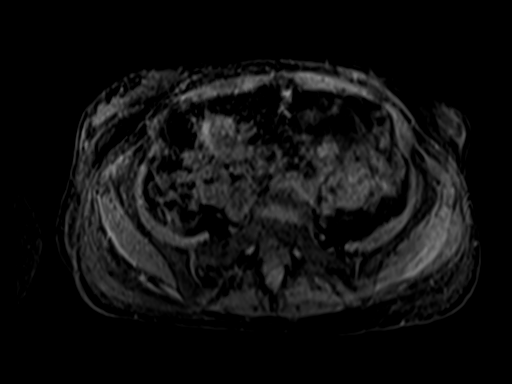
[im 56/112]
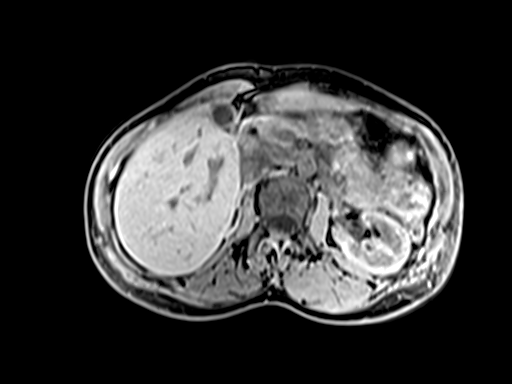
[im 112/112]
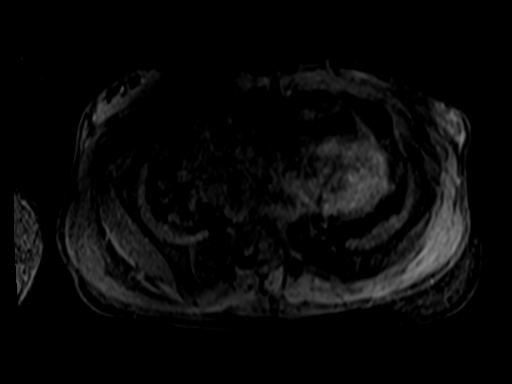

[Series 23: T1 dynamic fat-sat · axial · 2.5mm · 0.74mm/px · z∈[-136,+142]mm · 3 of 112 slices shown (2 of 2)]
[im 1/112]
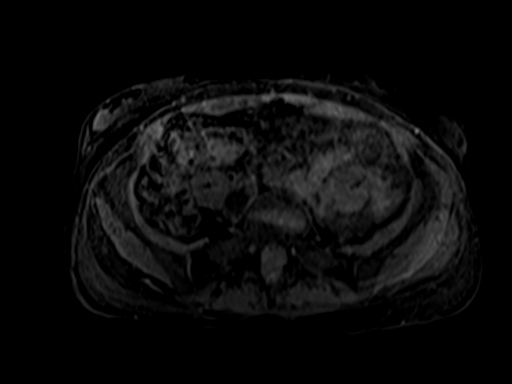
[im 56/112]
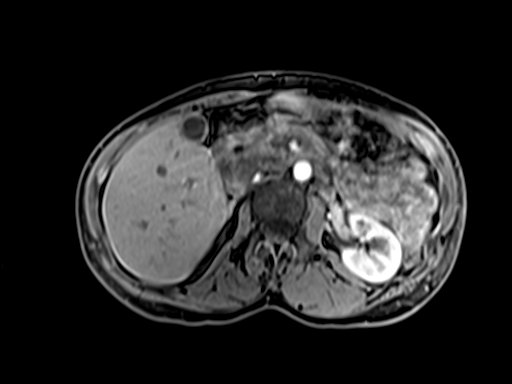
[im 112/112]
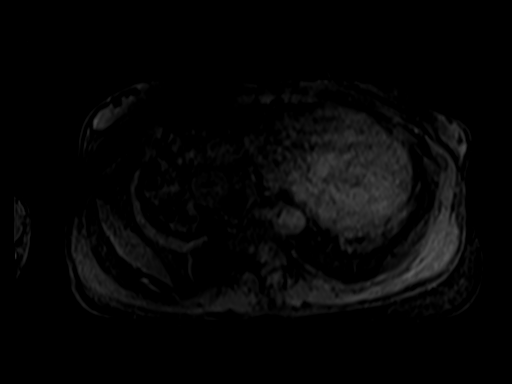

[Series 24: T1 dynamic fat-sat post-contrast · axial · 2.5mm · 0.74mm/px · z∈[-136,+142]mm · 4 of 112 slices shown (1 of 4)]
[im 1/112]
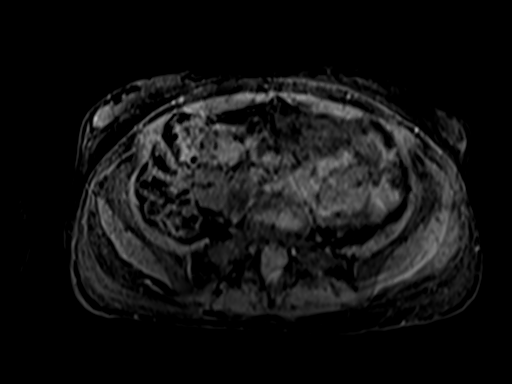
[im 38/112]
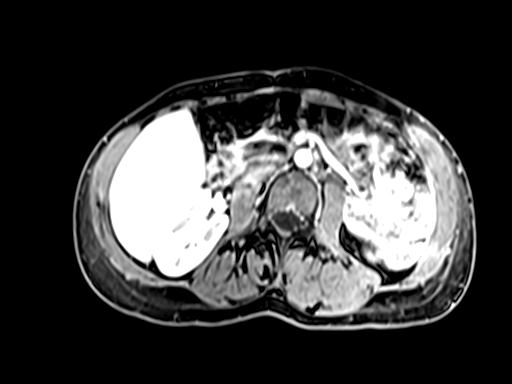
[im 75/112]
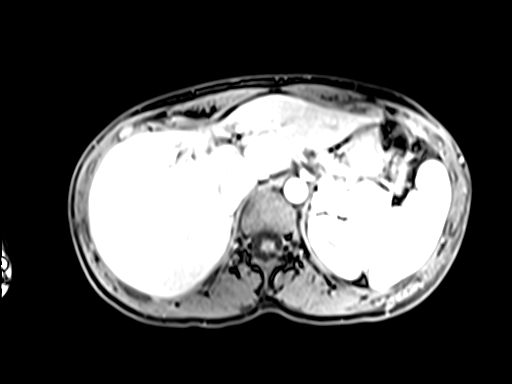
[im 112/112]
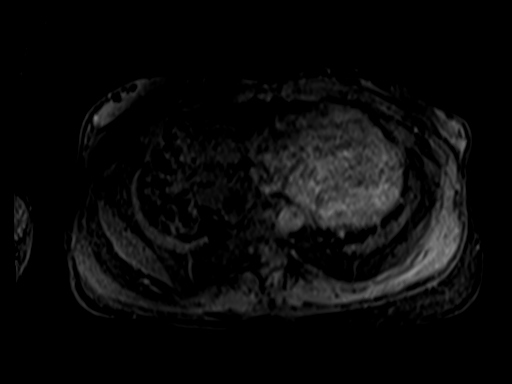

[Series 25: T1 dynamic fat-sat post-contrast · axial · 2.5mm · 0.74mm/px · z∈[-136,+142]mm · 4 of 112 slices shown (2 of 4)]
[im 1/112]
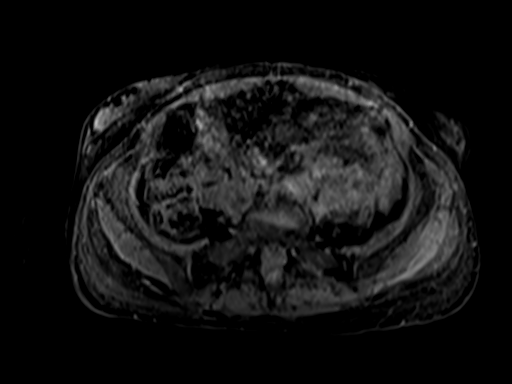
[im 38/112]
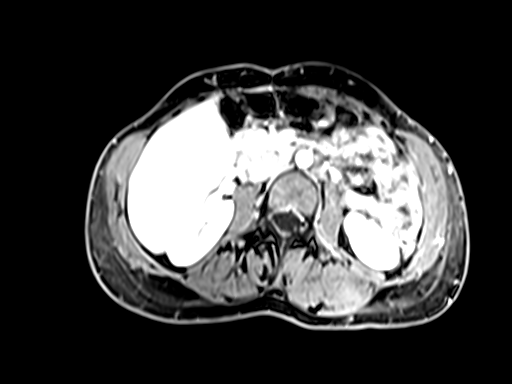
[im 75/112]
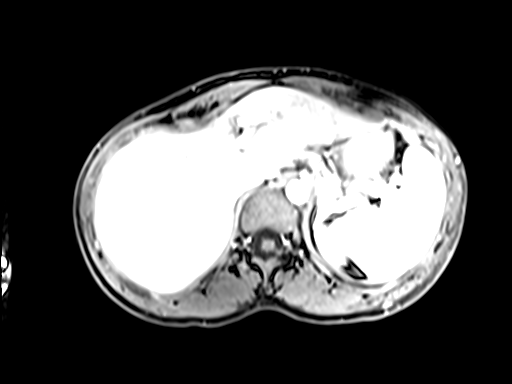
[im 112/112]
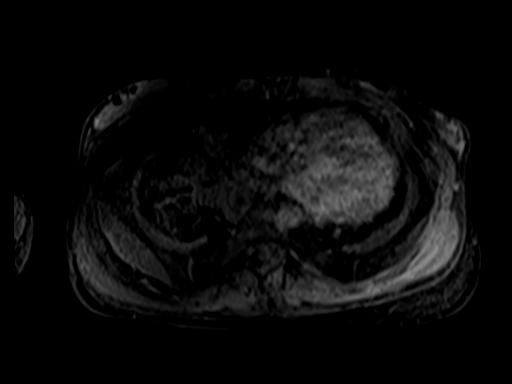

[Series 26: T1 dynamic fat-sat post-contrast · coronal · 2.5mm · 0.82mm/px · 2 of 72 slices shown (3 of 4)]
[im 1/72]
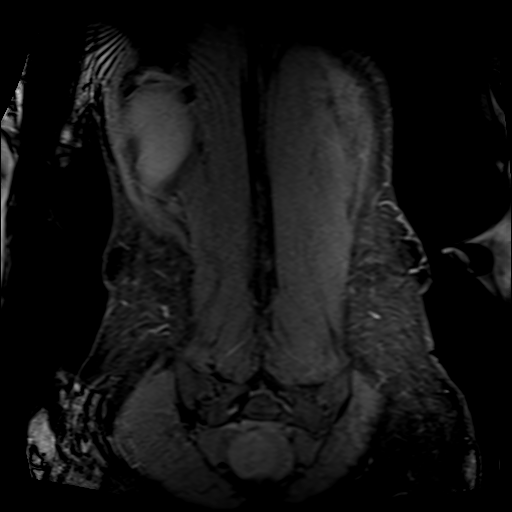
[im 72/72]
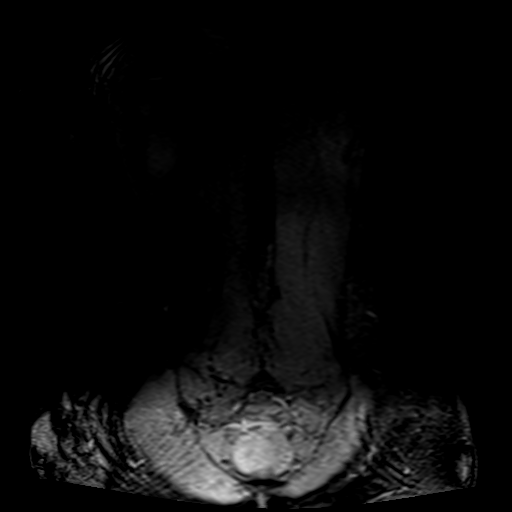

[Series 27: T1 dynamic fat-sat post-contrast · axial · 2.5mm · 0.74mm/px · z∈[-136,+49]mm · 3 of 112 slices shown (4 of 4)]
[im 1/112]
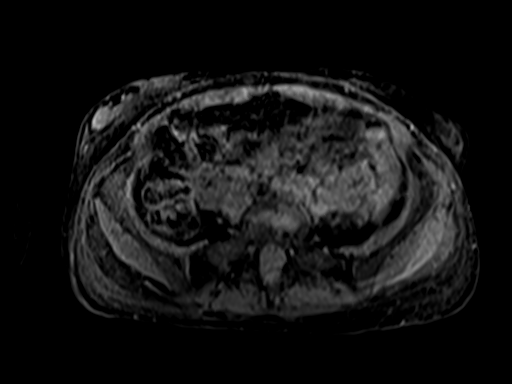
[im 38/112]
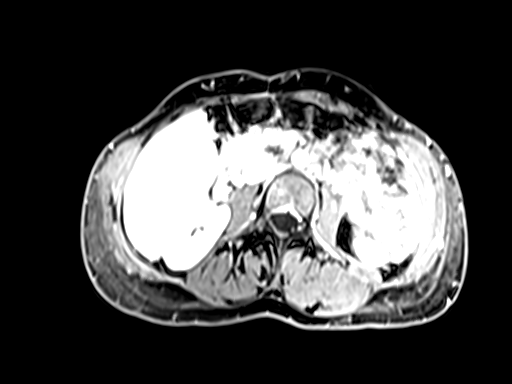
[im 75/112]
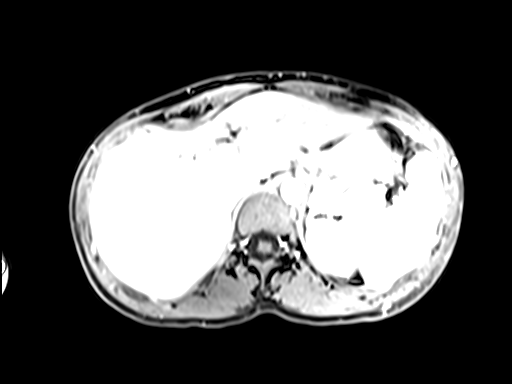

[27 of 48 positions shown; findings below may reference images not displayed]

FINDINGS: Lower chest:  Trace right pleural fluid.

Hepatobiliary: Liver is within normal limits. No
suspicious/enhancing hepatic lesions. No hepatic steatosis.

Gallbladder is unremarkable. No intrahepatic ductal dilatation.
Common duct measures 6 mm, within normal limits.

Pancreas: 6 mm nonenhancing cystic lesion in the pancreatic tail
(series 201/ image 13).

Spleen: Within normal limits.

Adrenals/Urinary Tract: Adrenal glands are within normal limits.

7 mm right lower pole renal cyst (series 2/image 28). Kidneys are
otherwise within normal limits. No hydronephrosis.

Stomach/Bowel: Stomach is within normal limits.

Visualized bowel is unremarkable.

Vascular/Lymphatic: No evidence abdominal aortic aneurysm.

No suspicious abdominal lymphadenopathy.

Other: No abdominal ascites.  Or

Musculoskeletal: No focal osseous lesions.
IMPRESSION: No intrahepatic or extrahepatic ductal dilatation.

Pancreatic head is within normal limits on MRI. 6 mm nonenhancing
cystic lesion in the pancreatic tail, likely benign. If clinically
warranted, a single follow-up MRI abdomen with/ without contrast can
be performed in 12 months. This recommendation follows ACR consensus
guidelines: Managing Incidental Findings on Abdominal CT: White
Paper of the ACR Incidental Findings Committee. [HOSPITAL]
4292;[DATE]

Trace right pleural effusion.

## 2019-05-11 ENCOUNTER — Other Ambulatory Visit: Payer: Self-pay

## 2019-05-11 DIAGNOSIS — R0602 Shortness of breath: Secondary | ICD-10-CM | POA: Diagnosis not present

## 2019-05-11 DIAGNOSIS — J45901 Unspecified asthma with (acute) exacerbation: Secondary | ICD-10-CM | POA: Insufficient documentation

## 2019-05-11 DIAGNOSIS — Z79899 Other long term (current) drug therapy: Secondary | ICD-10-CM | POA: Insufficient documentation

## 2019-05-11 DIAGNOSIS — Z20828 Contact with and (suspected) exposure to other viral communicable diseases: Secondary | ICD-10-CM | POA: Diagnosis not present

## 2019-05-11 DIAGNOSIS — J45909 Unspecified asthma, uncomplicated: Secondary | ICD-10-CM | POA: Diagnosis present

## 2019-05-11 LAB — BASIC METABOLIC PANEL
Anion gap: 9 (ref 5–15)
BUN: 9 mg/dL (ref 6–20)
CO2: 25 mmol/L (ref 22–32)
Calcium: 8.8 mg/dL — ABNORMAL LOW (ref 8.9–10.3)
Chloride: 102 mmol/L (ref 98–111)
Creatinine, Ser: 0.56 mg/dL (ref 0.44–1.00)
GFR calc Af Amer: >60 mL/min (ref >60)
GFR calc non Af Amer: >60 mL/min (ref >60)
Glucose, Bld: 109 mg/dL — ABNORMAL HIGH (ref 70–99)
Potassium: 3.9 mmol/L (ref 3.5–5.1)
Sodium: 136 mmol/L (ref 135–145)

## 2019-05-11 LAB — CBC
HCT: 39.3 % (ref 36.0–46.0)
Hemoglobin: 12.5 g/dL (ref 12.0–15.0)
MCH: 25.9 pg — ABNORMAL LOW (ref 26.0–34.0)
MCHC: 31.8 g/dL (ref 30.0–36.0)
MCV: 81.5 fL (ref 80.0–100.0)
Platelets: 207 10*3/uL (ref 150–400)
RBC: 4.82 MIL/uL (ref 3.87–5.11)
RDW: 13.4 % (ref 11.5–15.5)
WBC: 4.4 10*3/uL (ref 4.0–10.5)
nRBC: 0 % (ref 0.0–0.2)

## 2019-05-11 LAB — TROPONIN I (HIGH SENSITIVITY): Troponin I (High Sensitivity): <2 ng/L (ref <18)

## 2019-05-11 NOTE — ED Triage Notes (Signed)
Patient c/o  Asthma exacerbation. Patient reports rescue inhaler not working. Patient c/o chest tightness and dizziness.

## 2019-05-12 ENCOUNTER — Emergency Department: Payer: BC Managed Care – PPO

## 2019-05-12 ENCOUNTER — Emergency Department
Admission: EM | Admit: 2019-05-12 | Discharge: 2019-05-12 | Disposition: A | Discharge status: Home / Self Care | Payer: BC Managed Care – PPO | Attending: Emergency Medicine | Admitting: Emergency Medicine

## 2019-05-12 DIAGNOSIS — J45901 Unspecified asthma with (acute) exacerbation: Secondary | ICD-10-CM

## 2019-05-12 LAB — FIBRIN DERIVATIVES D-DIMER (ARMC ONLY): Fibrin derivatives D-dimer (ARMC): 499.5 ng/mL (FEU) — ABNORMAL HIGH (ref 0.00–499.00)

## 2019-05-12 LAB — POCT PREGNANCY, URINE: Preg Test, Ur: NEGATIVE

## 2019-05-12 LAB — SARS CORONAVIRUS 2 BY RT PCR (HOSPITAL ORDER, PERFORMED IN ~~LOC~~ HOSPITAL LAB): SARS Coronavirus 2: NEGATIVE

## 2019-05-12 MED ORDER — SODIUM CHLORIDE 0.9% FLUSH: 10.0000 mL | INTRAVENOUS | Status: DC | PRN

## 2019-05-12 MED ORDER — IPRATROPIUM-ALBUTEROL 0.5-2.5 (3) MG/3ML IN SOLN
3.0000 mL | Freq: Once | RESPIRATORY_TRACT | Status: AC
  Administered 2019-05-12: 3 mL via RESPIRATORY_TRACT

## 2019-05-12 MED ORDER — PREDNISONE 20 MG PO TABS: 60.0000 mg | ORAL_TABLET | Freq: Every day | ORAL | 0 refills | Status: AC

## 2019-05-12 MED ORDER — IPRATROPIUM-ALBUTEROL 0.5-2.5 (3) MG/3ML IN SOLN
3.0000 mL | Freq: Once | RESPIRATORY_TRACT | Status: AC
  Administered 2019-05-12: 3 mL via RESPIRATORY_TRACT
  Filled 2019-05-12: qty 3

## 2019-05-12 MED ORDER — PREDNISONE 20 MG PO TABS
60.0000 mg | ORAL_TABLET | Freq: Once | ORAL | Status: AC
  Administered 2019-05-12: 60 mg via ORAL
  Filled 2019-05-12: qty 3

## 2019-05-12 MED ORDER — IOHEXOL 350 MG/ML SOLN
75.0000 mL | Freq: Once | INTRAVENOUS | Status: AC | PRN
Start: 2019-05-12 — End: 2019-05-12
  Administered 2019-05-12: 06:00:00 75 mL via INTRAVENOUS
  Filled 2019-05-12: qty 75

## 2019-05-12 NOTE — ED Notes (Signed)
Phlebotomist at beside 

## 2019-05-12 NOTE — ED Provider Notes (Signed)
[facility] Emergency Department Provider Note  ____________________________________________  Time seen: Approximately 2:02 AM  I have reviewed the triage vital signs and the nursing notes.   HISTORY  Chief Complaint Asthma and Dizziness   HPI [name] Evans is a 43 y.o. female with a history of DVT and asthma who presents for evaluation of shortness of breath.  Patient reports 2 days of intermittent wheezing, chest tightness, shortness of breath, and burning discomfort in her posterior chest area.  Has been using her inhalers with no significant relief.  She denies cough or hemoptysis, fever chills, nausea, vomiting, diarrhea, anosmia, and exposure to COVID.  Patient reports having a DVT in her left lower extremity when she was pregnant, at that time she had an IVC filter placed which has now been removed.  Has never had another DVT or a PE.  She denies any exogenous hormones, hemoptysis, recent travel mobilization.  She has chronic swelling of her left lower extremity since having the DVT and that is unchanged.  She denies any leg pain.  Past Medical History:  Diagnosis Date  . DVT (deep venous thrombosis) (HCC)   . Frequent PVCs   . Frequent UTI   . Migraines   . Scoliosis     There are no active problems to display for this patient.   Past Surgical History:  Procedure Laterality Date  . CERVICAL CONE BIOPSY    . IVC filter, removed    . TUBAL LIGATION    . TYMPANOSTOMY TUBE PLACEMENT    . Uterine ablation      Prior to Admission medications   Medication Sig Start Date End Date Taking? Authorizing Provider  oxyCODONE-acetaminophen (PERCOCET) 7.5-325 MG tablet Take 1 tablet by mouth every 4 (four) hours as needed for severe pain. 06/22/16   Joni ReiningSmith, Ronald K, PA-C  oxyCODONE-acetaminophen (ROXICET) 5-325 MG tablet Take 1 tablet by mouth every 4 (four) hours as needed for severe pain. 05/27/16   Darci CurrentBrown, Sidney N, MD  predniSONE (DELTASONE) 20 MG  tablet Take 3 tablets (60 mg total) by mouth daily for 4 days. 05/12/19 05/16/19  Nita SickleVeronese, [place], MD  sulfamethoxazole-trimethoprim (BACTRIM DS,SEPTRA DS) 800-160 MG tablet Take 1 tablet by mouth 2 (two) times daily. 06/22/16   Joni ReiningSmith, Ronald K, PA-C    Allergies Toradol [ketorolac tromethamine] and Tramadol  No family history on file.  Social History Social History   Tobacco Use  . Smoking status: Current Every Day Smoker    Types: E-cigarettes  . Smokeless tobacco: Never Used  Substance Use Topics  . Alcohol use: No  . Drug use: Not on file    Review of Systems  Constitutional: Negative for fever. Eyes: Negative for visual changes. ENT: Negative for sore throat. Neck: No neck pain  Cardiovascular: + chest pain. Respiratory: + shortness of breath. Gastrointestinal: Negative for abdominal pain, vomiting or diarrhea. Genitourinary: Negative for dysuria. Musculoskeletal: Negative for back pain. Skin: Negative for rash. Neurological: Negative for headaches, weakness or numbness. Psych: No SI or HI  ____________________________________________   PHYSICAL EXAM:  VITAL SIGNS: ED Triage Vitals  Enc Vitals Group     BP 05/11/19 2217 125/75     Pulse Rate 05/11/19 2217 (!) 101     Resp 05/11/19 2217 (!) 22     Temp 05/11/19 2217 98.7 F (37.1 C)     Temp Source 05/11/19 2217 Oral     SpO2 05/11/19 2217 100 %     Weight 05/11/19 2215 134 lb 7.7  oz (61 kg)     Height 05/11/19 2215 5\' 5"  (1.651 m)     Head Circumference --      Peak Flow --      Pain Score 05/11/19 2215 4     Pain Loc --      Pain Edu? --      Excl. in GC? --     Constitutional: Alert and oriented. Well appearing and in no apparent distress. HEENT:      Head: Normocephalic and atraumatic.         Eyes: Conjunctivae are normal. Sclera is non-icteric.       Mouth/Throat: Mucous membranes are moist.       Neck: Supple with no signs of meningismus. Cardiovascular: Tachycardia with regular rhythm. No  murmurs, gallops, or rubs. 2+ symmetrical distal pulses are present in all extremities. No JVD. Respiratory: Mildly increased work of breathing, tachypneic to the low 20s, no hypoxia. Lungs are clear to auscultation bilaterally with slight decreased air movement but no wheezes, crackles, or rhonchi.  Gastrointestinal: Soft, non tender, and non distended with positive bowel sounds. No rebound or guarding. Musculoskeletal: Asymmetric swelling of left lower extremity Neurologic: Normal speech and language. Face is symmetric. Moving all extremities. No gross focal neurologic deficits are appreciated. Skin: Skin is warm, dry and intact. No rash noted. Psychiatric: Mood and affect are normal. Speech and behavior are normal.  ____________________________________________   LABS (all labs ordered are listed, but only abnormal results are displayed)  Labs Reviewed  BASIC METABOLIC PANEL - Abnormal; Notable for the following components:      Result Value   Glucose, Bld 109 (*)    Calcium 8.8 (*)    All other components within normal limits  CBC - Abnormal; Notable for the following components:   MCH 25.9 (*)    All other components within normal limits  FIBRIN DERIVATIVES D-DIMER ([facility] ONLY) - Abnormal; Notable for the following components:   Fibrin derivatives D-dimer ([facility]) 499.50 (*)    All other components within normal limits  SARS CORONAVIRUS 2 (HOSPITAL ORDER, PERFORMED IN [facility])  TROPONIN I (HIGH SENSITIVITY)  POC URINE PREG, ED  POCT PREGNANCY, URINE   ____________________________________________  EKG  ED ECG REPORT I, Nita Sicklearolina Emmelyn Schmale, the attending physician, personally viewed and interpreted this ECG.  Sinus tachycardia, rate of 102, normal intervals, normal axis, no ST elevations or depressions.  No prior for comparison ____________________________________________  RADIOLOGY  I have personally reviewed the images performed during this visit and I agree  with the Radiologist's read.   Interpretation by Radiologist:  Ct Angio Chest Pe W And/or Wo Contrast  Result Date: 05/12/2019 CLINICAL DATA:  Intermediate probability for pulmonary embolism. Positive D-dimer. EXAM: CT ANGIOGRAPHY CHEST WITH CONTRAST TECHNIQUE: Multidetector CT imaging of the chest was performed using the standard protocol during bolus administration of intravenous contrast. Multiplanar CT image reconstructions and MIPs were obtained to evaluate the vascular anatomy. CONTRAST:  75mL OMNIPAQUE IOHEXOL 350 MG/ML SOLN COMPARISON:  Chest x-ray earlier today FINDINGS: Cardiovascular: Suboptimal but satisfactory opacification of the pulmonary arteries with no filling defect. Normal heart size. No pericardial effusion. Mediastinum/Nodes: Negative for adenopathy or mass. Lungs/Pleura: Small subpleural nodule in the right lower lobe, likely lymph node. Mild dependent atelectasis. There is no edema, consolidation, effusion, or pneumothorax. Upper Abdomen: Granulomatous calcifications in the generous sized spleen which is partially covered. 8 mm splenic artery aneurysm, in retrospect stable from 2017 Musculoskeletal: Mild scoliosis.  No acute or  aggressive finding Review of the MIP images confirms the above findings. IMPRESSION: 1. Negative for pulmonary embolism or other acute finding. 2. 8 mm splenic artery aneurysm which is stable from 2017. Electronically Signed   By: Monte Fantasia M.D.   On: 05/12/2019 06:55   Dg Chest Portable 1 View  Result Date: 05/12/2019 CLINICAL DATA:  Asthma exacerbation EXAM: PORTABLE CHEST 1 VIEW COMPARISON:  None. FINDINGS: The heart size and mediastinal contours are within normal limits. Both lungs are clear. The visualized skeletal structures are unremarkable. IMPRESSION: No active disease. Electronically Signed   By: Ulyses Jarred M.D.   On: 05/12/2019 02:20      ____________________________________________   PROCEDURES  Procedure(s) performed: None  Procedures Critical Care performed:  None ____________________________________________   INITIAL IMPRESSION / ASSESSMENT AND PLAN / ED COURSE  43 y.o. female with a history of DVT and asthma who presents for evaluation of shortness of breath, wheezing, chest tightness, chest burning x 2 days.  Patient is well-appearing with mildly increased work of breathing, mild tachypnea, no hypoxia, slightly diminished air movement bilaterally with no wheezing or crackles.  Chronic asymmetric swelling of the left lower extremity.  EKG with no acute ischemic changes.  Differential diagnoses including asthma exacerbation versus pneumonia versus COVID versus PE.  We will send a d-dimer, COVID swab, chest x-ray.  Will treat with prednisone and duo nebs.    _________________________ 7:03 AM on 05/12/2019 -----------------------------------------  D-dimer was positive therefore CT was done which is negative for PE.  No evidence of pneumonia.  Patient feels markedly improved after 3 duo nebs and steroids.  Will discharge home on prednisone for asthma exacerbation.  Recommended close follow-up with PCP.  Discussed my standard return precautions.   As part of my medical decision making, I reviewed the following data within the [facility] notes reviewed and incorporated, Labs reviewed , EKG interpreted , Radiograph reviewed , Notes from prior ED visits and [place] Controlled Substance Database    Pertinent labs & imaging results that were available during my care of the patient were reviewed by me and considered in my medical decision making (see chart for details).    ____________________________________________   FINAL CLINICAL IMPRESSION(S) / ED DIAGNOSES  Final diagnoses:  Exacerbation of asthma, unspecified asthma severity, unspecified whether persistent      NEW MEDICATIONS STARTED DURING THIS VISIT:  ED Discharge Orders         Ordered    predniSONE (DELTASONE) 20 MG  tablet  Daily     05/12/19 0703           Note:  This document was prepared using Dragon voice recognition software and may include unintentional dictation errors.    Alfred Levins, [place], MD 05/12/19 [phone]

## 2019-05-12 NOTE — ED Notes (Signed)
IV team member at bedside 

## 2019-05-12 NOTE — ED Notes (Signed)
Patient reports SOB that is not improved by rescue inhaler. Patient stated that the last time this happed she has pneumonia. Patient stated that she has a "burning sensation in her back". Patient stated that she hasn't had an episode like this in 6 months. Patient has a history of a DVT at 43 y/o. No evidence of a bloody sputum or cough.

## 2019-05-12 NOTE — ED Notes (Signed)
IV team at the bedside. 

## 2019-05-12 NOTE — ED Notes (Signed)
Pt verbalized understanding of discharge instructions. NAD at this time. 

## 2019-05-12 NOTE — ED Notes (Signed)
Called lab to request a phlebotomist to draw D-Dimer.

## 2021-12-14 ENCOUNTER — Encounter (HOSPITAL_BASED_OUTPATIENT_CLINIC_OR_DEPARTMENT_OTHER): Payer: BC Managed Care – PPO | Admitting: Internal Medicine

## 2022-09-05 ENCOUNTER — Emergency Department (HOSPITAL_COMMUNITY)
Admission: EM | Admit: 2022-09-05 | Discharge: 2022-09-05 | Disposition: A | Discharge status: Home / Self Care | Payer: BC Managed Care – PPO | Attending: Emergency Medicine | Admitting: Emergency Medicine

## 2022-09-05 ENCOUNTER — Other Ambulatory Visit: Payer: Self-pay

## 2022-09-05 ENCOUNTER — Encounter (HOSPITAL_COMMUNITY): Payer: Self-pay

## 2022-09-05 ENCOUNTER — Emergency Department (HOSPITAL_COMMUNITY): Payer: BC Managed Care – PPO

## 2022-09-05 DIAGNOSIS — L0889 Other specified local infections of the skin and subcutaneous tissue: Secondary | ICD-10-CM | POA: Diagnosis present

## 2022-09-05 DIAGNOSIS — L089 Local infection of the skin and subcutaneous tissue, unspecified: Secondary | ICD-10-CM

## 2022-09-05 LAB — COMPREHENSIVE METABOLIC PANEL
ALT: 27 U/L (ref 0–44)
AST: 35 U/L (ref 15–41)
Albumin: 3.6 g/dL (ref 3.5–5.0)
Alkaline Phosphatase: 86 U/L (ref 38–126)
Anion gap: 8 (ref 5–15)
BUN: 9 mg/dL (ref 6–20)
CO2: 25 mmol/L (ref 22–32)
Calcium: 9 mg/dL (ref 8.9–10.3)
Chloride: 106 mmol/L (ref 98–111)
Creatinine, Ser: 0.57 mg/dL (ref 0.44–1.00)
GFR, Estimated: >60 mL/min (ref >60)
Glucose, Bld: 109 mg/dL — ABNORMAL HIGH (ref 70–99)
Potassium: 3.3 mmol/L — ABNORMAL LOW (ref 3.5–5.1)
Sodium: 139 mmol/L (ref 135–145)
Total Bilirubin: 0.5 mg/dL (ref 0.3–1.2)
Total Protein: 7.6 g/dL (ref 6.5–8.1)

## 2022-09-05 LAB — CBC WITH DIFFERENTIAL/PLATELET
Abs Immature Granulocytes: 0.01 10*3/uL (ref 0.00–0.07)
Basophils Absolute: 0 10*3/uL (ref 0.0–0.1)
Basophils Relative: 1 %
Eosinophils Absolute: 0.1 10*3/uL (ref 0.0–0.5)
Eosinophils Relative: 3 %
HCT: 34 % — ABNORMAL LOW (ref 36.0–46.0)
Hemoglobin: 10.5 g/dL — ABNORMAL LOW (ref 12.0–15.0)
Immature Granulocytes: 0 %
Lymphocytes Relative: 30 %
Lymphs Abs: 1 10*3/uL (ref 0.7–4.0)
MCH: 27.1 pg (ref 26.0–34.0)
MCHC: 30.9 g/dL (ref 30.0–36.0)
MCV: 87.9 fL (ref 80.0–100.0)
Monocytes Absolute: 0.4 10*3/uL (ref 0.1–1.0)
Monocytes Relative: 12 %
Neutro Abs: 1.8 10*3/uL (ref 1.7–7.7)
Neutrophils Relative %: 54 %
Platelets: 163 10*3/uL (ref 150–400)
RBC: 3.87 MIL/uL (ref 3.87–5.11)
RDW: 12.9 % (ref 11.5–15.5)
WBC: 3.3 10*3/uL — ABNORMAL LOW (ref 4.0–10.5)
nRBC: 0 % (ref 0.0–0.2)

## 2022-09-05 LAB — LACTIC ACID, PLASMA: Lactic Acid, Venous: 2.2 mmol/L (ref 0.5–1.9)

## 2022-09-05 MED ORDER — BACITRACIN ZINC 500 UNIT/GM EX OINT
TOPICAL_OINTMENT | Freq: Two times a day (BID) | CUTANEOUS | Status: DC
  Filled 2022-09-05: qty 0.9

## 2022-09-05 MED ORDER — DOXYCYCLINE HYCLATE 100 MG PO TABS
100.0000 mg | ORAL_TABLET | Freq: Once | ORAL | Status: AC
  Administered 2022-09-05: 100 mg via ORAL
  Filled 2022-09-05: qty 1

## 2022-09-05 MED ORDER — CEPHALEXIN 500 MG PO CAPS: 500.0000 mg | ORAL_CAPSULE | Freq: Four times a day (QID) | ORAL | 0 refills | Status: DC

## 2022-09-05 MED ORDER — HYDROCODONE-ACETAMINOPHEN 5-325 MG PO TABS
1.0000 | ORAL_TABLET | Freq: Once | ORAL | Status: AC
  Administered 2022-09-05: 1 via ORAL
  Filled 2022-09-05: qty 1

## 2022-09-05 MED ORDER — BACITRACIN ZINC 500 UNIT/GM EX OINT: 1.0000 | TOPICAL_OINTMENT | Freq: Two times a day (BID) | CUTANEOUS | 0 refills | Status: DC

## 2022-09-05 MED ORDER — CEPHALEXIN 250 MG PO CAPS
500.0000 mg | ORAL_CAPSULE | Freq: Once | ORAL | Status: AC
  Administered 2022-09-05: 500 mg via ORAL
  Filled 2022-09-05: qty 2

## 2022-09-05 MED ORDER — DOXYCYCLINE HYCLATE 100 MG PO CAPS: 100.0000 mg | ORAL_CAPSULE | Freq: Two times a day (BID) | ORAL | 0 refills | Status: DC

## 2022-09-05 NOTE — ED Provider Notes (Signed)
[facility] EMERGENCY DEPARTMENT Provider Note   CSN: [id number] Arrival date & time: 09/05/22  1344     History  No chief complaint on file.   [name] Evans is a 46 y.o. female.  HPI     46 year old female comes in with chief complaint of wound evaluation. Patient has no significant medical history.  She has history of DVT in the left lower extremity.  She indicates that she has history of varicose veins.  She started having infection in her left leg few months back which was treated with antibiotics.  The infection led to " poor healing of skin tissue".  About 2 months ago she bumped her leg on 2 hard surface at work and she had a small ulcer that opened up.  Over the course of the last 2 weeks that ulcer has opened up dramatically.  There is some drainage of clear fluid.  She has increased swelling and discomfort as well.  Review of system is negative for any nausea, vomiting, fevers, chills.  Home Medications Prior to Admission medications   Medication Sig Start Date End Date Taking? Authorizing Provider  bacitracin ointment Apply 1 Application topically 2 (two) times daily. 09/05/22  Yes Hikari Tripp, MD  cephALEXin (KEFLEX) 500 MG capsule Take 1 capsule (500 mg total) by mouth 4 (four) times daily. 09/05/22  Yes Derwood Kaplan, MD  doxycycline (VIBRAMYCIN) 100 MG capsule Take 1 capsule (100 mg total) by mouth 2 (two) times daily. 09/05/22  Yes Derwood Kaplan, MD  oxyCODONE-acetaminophen (PERCOCET) 7.5-325 MG tablet Take 1 tablet by mouth every 4 (four) hours as needed for severe pain. 06/22/16   Joni Reining, PA-C  oxyCODONE-acetaminophen (ROXICET) 5-325 MG tablet Take 1 tablet by mouth every 4 (four) hours as needed for severe pain. 05/27/16   Darci Current, MD      Allergies    Toradol [ketorolac tromethamine] and Tramadol    Review of Systems   Review of Systems  All other systems reviewed and are negative.   Physical Exam Updated  Vital Signs BP (!) 135/97   Pulse 81   Temp 98.1 F (36.7 C)   Resp 16   SpO2 97%  Physical Exam Vitals and nursing note reviewed.  Constitutional:      Appearance: She is well-developed.  HENT:     Head: Atraumatic.  Cardiovascular:     Rate and Rhythm: Normal rate.  Pulmonary:     Effort: Pulmonary effort is normal.  Musculoskeletal:        General: Swelling present.     Cervical back: Normal range of motion and neck supple.     Comments: Left lower extremity has unilateral edema  Skin:    General: Skin is warm and dry.     Comments: Patient has a large ulcerated lesion over the left leg and a smaller ulcerated wound over the left foot. 2+ dorsalis pedis.  Skin is warm to touch. Patient able to plantar and dorsiflex without significant discomfort.  Skin is warm to touch.  Neurological:     Mental Status: She is alert and oriented to person, place, and time.              ED Results / Procedures / Treatments   Labs (all labs ordered are listed, but only abnormal results are displayed) Labs Reviewed  COMPREHENSIVE METABOLIC PANEL - Abnormal; Notable for the following components:      Result Value   Potassium 3.3 (*)  Glucose, Bld 109 (*)    All other components within normal limits  CBC WITH DIFFERENTIAL/PLATELET - Abnormal; Notable for the following components:   WBC 3.3 (*)    Hemoglobin 10.5 (*)    HCT 34.0 (*)    All other components within normal limits  LACTIC ACID, PLASMA - Abnormal; Notable for the following components:   Lactic Acid, Venous 2.2 (*)    All other components within normal limits  LACTIC ACID, PLASMA    EKG None  Radiology DG Foot Complete Left  Result Date: 09/05/2022 CLINICAL DATA:  Pain, open wound in the skin EXAM: LEFT FOOT - COMPLETE 3+ VIEW COMPARISON:  None Available. FINDINGS: No fracture or dislocation is seen. No focal lytic lesions are seen. There is soft tissue swelling over the dorsum of the foot and around the  ankle. There are no opaque foreign bodies. IMPRESSION: No significant radiographic abnormalities are seen in the bony structures of left foot. Electronically Signed   By: Ernie Avena M.D.   On: 09/05/2022 16:09   DG Tibia/Fibula Left  Result Date: 09/05/2022 CLINICAL DATA:  Pain, open wound in left lower leg EXAM: LEFT TIBIA AND FIBULA - 2 VIEW COMPARISON:  None Available. FINDINGS: No fracture or dislocation is seen. There are no focal lytic lesions. Bony spurs are seen in left knee. There is subcutaneous edema in the left lower leg. There is marked soft tissue swelling around the ankle. There are no opaque foreign bodies. IMPRESSION: No fracture or dislocation is seen in left tibia and fibula. There are no focal lytic lesions. Electronically Signed   By: Ernie Avena M.D.   On: 09/05/2022 16:08    Procedures Procedures    Medications Ordered in ED Medications  HYDROcodone-acetaminophen (NORCO/VICODIN) 5-325 MG per tablet 1 tablet (has no administration in time range)  doxycycline (VIBRA-TABS) tablet 100 mg (has no administration in time range)  cephALEXin (KEFLEX) capsule 500 mg (has no administration in time range)  bacitracin ointment (has no administration in time range)    ED Course/ Medical Decision Making/ A&P                           Medical Decision Making This patient presents to the ED with chief complaint(s) of worsening left leg wound and swelling with pertinent past medical history of DVT of the left lower extremity, not on anticoagulation right now.The complaint involves an extensive differential diagnosis and also carries with it a high risk of complications and morbidity.    The differential diagnosis includes : Infected ulcer, myositis, osteomyelitis, cellulitis, DVT. Patient has no clinical evidence of arterial insufficiency.  Basic labs were ordered.  Patient does not have profound leukocytosis or any organ dysfunction. X-ray of her leg was ordered.   There is no evidence of gas, no lytic lesion appreciated.  Overall low clinical suspicion for osteomyelitis for this otherwise healthy patient who does not have vascular insufficiency.  Plan is to order ultrasound DVT as an outpatient.  We will start patient on Keflex and doxycycline.  We will advised her to follow-up with wound care, wound care clinic information provided.  We will have patient come back to the ER for wound recheck in 3 days.  Strict ER return precautions discussed.   Additional history obtained: Additional history obtained from patient's boyfriend   Independent labs interpretation:  The following labs were independently interpreted: CBC, BMP which overall is reassuring.  Independent visualization and interpretation  of imaging: - I independently visualized the following imaging with scope of interpretation limited to determining acute life threatening conditions related to emergency care: X-ray of the lower extremity, which revealed no gas, lytic lesions   Consideration for admission or further workup: Admission considered, however patient is immunocompetent, nontoxic, clinically not septic -outpatient care appropriate unless she fails.   Risk OTC drugs. Prescription drug management.    Final Clinical Impression(s) / ED Diagnoses Final diagnoses:  Infected wound    Rx / DC Orders ED Discharge Orders          Ordered    doxycycline (VIBRAMYCIN) 100 MG capsule  2 times daily        09/05/22 1937    cephALEXin (KEFLEX) 500 MG capsule  4 times daily        09/05/22 1937    bacitracin ointment  2 times daily        09/05/22 1939    LE VENOUS        09/05/22 1941              Varney Biles, MD 09/05/22 1947

## 2022-09-05 NOTE — ED Provider Triage Note (Signed)
Emergency Medicine Provider Triage Evaluation Note  [name] Evans , a 46 y.o. female  was evaluated in triage.  Pt complains of left lower extremity wounds.  Patient notes wound being chronic in nature with acute exacerbation over the past week.  Over the past week, last 2 days have been noticeably worse.  She works on her feet all day at [employer].  She noted bumping her left lower extremity on an object in the store approximately 1 to 2 weeks ago and has noticed subsequent worsening of the wound since.  Reports subjective fever this morning.  Denies weakness or sensory deficits in lower extremity..  Review of Systems  Positive: See above Negative:   Physical Exam  BP (!) 162/94 (BP Location: Right Arm)   Pulse 84   Temp 98.2 F (36.8 C) (Oral)   Resp 16   SpO2 100%  Gen:   Awake, no distress   Resp:  Normal effort  MSK:   Moves extremities without difficulty  Other:    Medical Decision Making  Medically screening exam initiated at 3:19 PM.  Appropriate orders placed.  Rhetta Cleek was informed that the remainder of the evaluation will be completed by another provider, this initial triage assessment does not replace that evaluation, and the importance of remaining in the ED until their evaluation is complete.        Wilnette Kales, [place] 09/05/22 1521

## 2022-09-05 NOTE — ED Notes (Signed)
No need to draw second lactic acid per Dr. Kathrynn Humble

## 2022-09-05 NOTE — ED Triage Notes (Signed)
Patient here with open painful wounds to left lower leg and foot for months. Open and draining. Denies fever and chills

## 2022-09-05 NOTE — Discharge Instructions (Addendum)
Call the wound care center for follow-up in 7 to 10 days. Return to the emergency room for wound recheck in 3 days.  We will initiate you on prescription for appears to be infected wound.  Please keep the wound clean and dry.  Apply dressing twice, topical antibiotics also prescribed.  Consider purchasing none at the same dressing from pharmacy. We have ordered ultrasound DVT for your leg, please follow the instructions to get that completed tomorrow.  Return to the ER sooner if you start having increasing pain, fevers, chills, swelling and redness spreading towards your knee.

## 2022-09-06 ENCOUNTER — Ambulatory Visit (HOSPITAL_COMMUNITY): Admission: RE | Admit: 2022-09-06 | Payer: BC Managed Care – PPO | Source: Ambulatory Visit

## 2022-09-08 ENCOUNTER — Emergency Department (HOSPITAL_COMMUNITY)
Admission: EM | Admit: 2022-09-08 | Discharge: 2022-09-08 | Disposition: A | Discharge status: Home / Self Care | Payer: BC Managed Care – PPO | Attending: Emergency Medicine | Admitting: Emergency Medicine

## 2022-09-08 ENCOUNTER — Other Ambulatory Visit: Payer: Self-pay

## 2022-09-08 ENCOUNTER — Encounter (HOSPITAL_COMMUNITY): Payer: Self-pay

## 2022-09-08 DIAGNOSIS — Z4800 Encounter for change or removal of nonsurgical wound dressing: Secondary | ICD-10-CM | POA: Insufficient documentation

## 2022-09-08 DIAGNOSIS — Z5189 Encounter for other specified aftercare: Secondary | ICD-10-CM

## 2022-09-08 NOTE — ED Provider Notes (Signed)
[facility] EMERGENCY DEPARTMENT Provider Note   CSN: [id number] Arrival date & time: 09/08/22  2008     History No chief complaint on file.   [name] Evans is a 46 y.o. female with a history of DVT presenting for wound check.  2 months ago she sustained an injury to her left lower extremity after hitting it on a cabinet at work.  There was an ulcer originally but it opened up and became infected.  She was seen 3 days ago and started on Keflex and doxycycline and given instructions to return for wound check in 3 days and see wound care  HPI     Home Medications Prior to Admission medications   Medication Sig Start Date End Date Taking? Authorizing Provider  bacitracin ointment Apply 1 Application topically 2 (two) times daily. 09/05/22   Varney Biles, MD  cephALEXin (KEFLEX) 500 MG capsule Take 1 capsule (500 mg total) by mouth 4 (four) times daily. 09/05/22   Varney Biles, MD  doxycycline (VIBRAMYCIN) 100 MG capsule Take 1 capsule (100 mg total) by mouth 2 (two) times daily. 09/05/22   Varney Biles, MD  oxyCODONE-acetaminophen (PERCOCET) 7.5-325 MG tablet Take 1 tablet by mouth every 4 (four) hours as needed for severe pain. 06/22/16   Sable Feil, PA-C  oxyCODONE-acetaminophen (ROXICET) 5-325 MG tablet Take 1 tablet by mouth every 4 (four) hours as needed for severe pain. 05/27/16   Gregor Hams, MD      Allergies    Toradol [ketorolac tromethamine] and Tramadol    Review of Systems   Review of Systems  Physical Exam Updated Vital Signs BP (!) 150/84 (BP Location: Right Arm)   Pulse 78   Temp 98.9 F (37.2 C)   Resp 18   SpO2 100%  Physical Exam Vitals and nursing note reviewed.  Constitutional:      Appearance: Normal appearance.  HENT:     Head: Normocephalic and atraumatic.  Eyes:     General: No scleral icterus.    Conjunctiva/sclera: Conjunctivae normal.  Pulmonary:     Effort: Pulmonary effort is normal. No respiratory  distress.  Skin:    Findings: No rash.     Comments: Well-healing wound to the dorsal surface of the left foot and anterior left shin.  Strong DP pulse.  Full range of motion of MTPs and ankle.  Compartments soft.  Neurological:     Mental Status: She is alert.  Psychiatric:        Mood and Affect: Mood normal.     ED Results / Procedures / Treatments   Labs (all labs ordered are listed, but only abnormal results are displayed) Labs Reviewed - No data to display  EKG None  Radiology No results found.  Procedures Procedures   Medications Ordered in ED Medications - No data to display  ED Course/ Medical Decision Making/ A&P                           Medical Decision Making  46 year old female presenting for wound check.  Wounds appear to be healing well with her antibiotics.  She will continue these and continue her wound care with bacitracin and clean dressings.  She already has a wound care follow-up.    Unfortunately I currently do not have access to mobile epic to add a photo to patient's chart however I did compare her wound to how it appeared 3 days ago and it  is much improved.  No signs of current cellulitis however she shall continue her antibiotics.  Return precautions discussed. Final Clinical Impression(s) / ED Diagnoses Final diagnoses:  Visit for wound check    Rx / DC Orders ED Discharge Orders     None      Results and diagnoses were explained to the patient. Return precautions discussed in full. Patient had no additional questions and expressed complete understanding.   This chart was dictated using voice recognition software.  Despite best efforts to proofread,  errors can occur which can change the documentation meaning.     Darliss Ridgel 09/08/22 2145    Elgie Congo, MD 09/08/22 2228

## 2022-09-08 NOTE — ED Triage Notes (Signed)
Pt reports she is here for a wound check on her left leg. Denies any changes to the wound from previous visit. Denies fevers/chills.

## 2022-09-08 NOTE — Discharge Instructions (Addendum)
Your wound is healing well.  Keep your follow-up with the wound care center and call them tomorrow to see if you can get established later this week.  Return with any fevers, chills or worsening symptoms.  A work note is attached.

## 2022-09-28 ENCOUNTER — Encounter (HOSPITAL_BASED_OUTPATIENT_CLINIC_OR_DEPARTMENT_OTHER): Payer: BC Managed Care – PPO | Attending: Internal Medicine | Admitting: Internal Medicine

## 2022-09-28 DIAGNOSIS — I872 Venous insufficiency (chronic) (peripheral): Secondary | ICD-10-CM | POA: Insufficient documentation

## 2022-09-28 DIAGNOSIS — G8929 Other chronic pain: Secondary | ICD-10-CM | POA: Insufficient documentation

## 2022-09-28 DIAGNOSIS — L97822 Non-pressure chronic ulcer of other part of left lower leg with fat layer exposed: Secondary | ICD-10-CM | POA: Insufficient documentation

## 2022-09-28 DIAGNOSIS — Z86718 Personal history of other venous thrombosis and embolism: Secondary | ICD-10-CM | POA: Insufficient documentation

## 2022-09-28 DIAGNOSIS — F17290 Nicotine dependence, other tobacco product, uncomplicated: Secondary | ICD-10-CM | POA: Diagnosis not present

## 2022-09-28 DIAGNOSIS — I87312 Chronic venous hypertension (idiopathic) with ulcer of left lower extremity: Secondary | ICD-10-CM | POA: Insufficient documentation

## 2022-09-30 NOTE — Progress Notes (Signed)
[name] Evans ([id number]) [id number] Page 1 of 12 Visit Report for 09/28/2022 Allergy List Details Patient Name: Date of Service: [name] Evans, [name] Evans 09/28/2022 8:00 A M Medical Record Number: [id number] Patient Account Number: [id number] Date of Birth/Sex: Treating RN: 1976/09/26 (46 y.o. Toniann Fail Primary Care Shanavia Makela: PA Zenovia Jordan, [place] Other Clinician: Referring [place] Gipe: Treating Ezekiah Massie/Extender: Tilda Franco in Treatment: 0 Allergies Active Allergies Toradol Allergy Notes Electronic Signature(s) Signed: 09/28/2022 4:10:29 PM By: Fonnie Mu RN Entered By: Fonnie Mu on 09/25/2022 12:23:04 -------------------------------------------------------------------------------- Arrival Information Details Patient Name: Date of Service: [name] Evans, [name] Evans 09/28/2022 8:00 A M Medical Record Number: [id number] Patient Account Number: [id number] Date of Birth/Sex: Treating RN: 29-May-1976 (46 y.o. [name] Evans Primary Care Marcella Charlson: PA Zenovia Jordan, NO Other Clinician: Referring Toran Murch: Treating Shirl Ludington/Extender: Tilda Franco in Treatment: 0 Visit Information Patient Arrived: Ambulatory Arrival Time: 08:22 Accompanied By: self Transfer Assistance: None Patient Identification Verified: Yes Secondary Verification Process Completed: Yes Patient Requires Transmission-Based Precautions: No Patient Has Alerts: No Electronic Signature(s) Signed: 09/28/2022 4:10:29 PM By: Fonnie Mu RN Entered By: Fonnie Mu on 09/28/2022 09:02:04 -------------------------------------------------------------------------------- Clinic Level of Care Assessment Details Patient Name: Date of Service: [name] Evans, [name] Evans 09/28/2022 8:00 A M Medical Record Number: [id number] Patient Account Number: [id number] [name] Evans, [name] Evans ([id number]) [id number] Page 2 of 12 Date of Birth/Sex: Treating  RN: 08-06-1976 (46 y.o. [name] Evans, [name] Evans Primary Care Amber Guthridge: PA TIENT, NO Other Clinician: Referring Avyan Livesay: Treating Zaylie Gisler/Extender: Tilda Franco in Treatment: 0 Clinic Level of Care Assessment Items TOOL 4 Quantity Score X- 1 0 Use when only an EandM is performed on FOLLOW-UP visit ASSESSMENTS - Nursing Assessment / Reassessment X- 1 10 Reassessment of Co-morbidities (includes updates in patient status) X- 1 5 Reassessment of Adherence to Treatment Plan ASSESSMENTS - Wound and Skin A ssessment / Reassessment  - 0 Simple Wound Assessment / Reassessment - one wound X- 2 5 Complex Wound Assessment / Reassessment - multiple wounds  - 0 Dermatologic / Skin Assessment (not related to wound area) ASSESSMENTS - Focused Assessment X- 1 5 Circumferential Edema Measurements - multi extremities  - 0 Nutritional Assessment / Counseling / Intervention  - 0 Lower Extremity Assessment (monofilament, tuning fork, pulses)  - 0 Peripheral Arterial Disease Assessment (using hand held doppler) ASSESSMENTS - Ostomy and/or Continence Assessment and Care  - 0 Incontinence Assessment and Management  - 0 Ostomy Care Assessment and Management (repouching, etc.) PROCESS - Coordination of Care  - 0 Simple Patient / Family Education for ongoing care X- 1 20 Complex (extensive) Patient / Family Education for ongoing care X- 1 10 Staff obtains Chiropractor, Records, T Results / Process Orders est X- 1 10 Staff telephones HHA, Nursing Homes / Clarify orders / etc  - 0 Routine Transfer to another Facility (non-emergent condition)  - 0 Routine Hospital Admission (non-emergent condition) X- 1 15 New Admissions / Manufacturing engineer / Ordering NPWT Apligraf, etc. ,  - 0 Emergency Hospital Admission (emergent condition)  - 0 Simple Discharge Coordination X- 1 15 Complex (extensive) Discharge Coordination PROCESS - Special Needs  - 0 Pediatric /  Minor Patient Management  - 0 Isolation Patient Management  - 0 Hearing / Language / Visual special needs  - 0 Assessment of Community assistance (transportation, D/C planning, etc.)  - 0 Additional assistance / Altered mentation  - 0 Support Surface(s) Assessment (bed, cushion, seat, etc.) INTERVENTIONS - Wound Cleansing / Measurement  - 0 Simple Wound Cleansing - one wound X- 2 5  Complex Wound Cleansing - multiple wounds X- 1 5 Wound Imaging (photographs - any number of wounds)  - 0 Wound Tracing (instead of photographs)  - 0 Simple Wound Measurement - one wound X- 2 5 Complex Wound Measurement - multiple wounds Odle, [name] Evans Lank ([id number]) [id number] Page 3 of 12 INTERVENTIONS - Wound Dressings  - 0 Small Wound Dressing one or multiple wounds X- 2 15 Medium Wound Dressing one or multiple wounds  - 0 Large Wound Dressing one or multiple wounds X- 1 5 Application of Medications - topical  - 0 Application of Medications - injection INTERVENTIONS - Miscellaneous  - 0 External ear exam  - 0 Specimen Collection (cultures, biopsies, blood, body fluids, etc.)  - 0 Specimen(s) / Culture(s) sent or taken to Lab for analysis  - 0 Patient Transfer (multiple staff / Nurse, adult / Similar devices)  - 0 Simple Staple / Suture removal (25 or less)  - 0 Complex Staple / Suture removal (26 or more)  - 0 Hypo / Hyperglycemic Management (close monitor of Blood Glucose) X- 1 15 Ankle / Brachial Index (ABI) - do not check if billed separately X- 1 5 Vital Signs Has the patient been seen at the hospital within the last three years: Yes Total Score: 180 Level Of Care: New/Established - Level 5 Electronic Signature(s) Signed: 09/28/2022 4:10:29 PM By: Fonnie Mu RN Entered By: Fonnie Mu on 09/28/2022 09:41:41 -------------------------------------------------------------------------------- Encounter  Discharge Information Details Patient Name: Date of Service: [name] Evans 09/28/2022 8:00 A M Medical Record Number: [id number] Patient Account Number: [id number] Date of Birth/Sex: Treating RN: [birth year] (46 y.o. [name] Evans, [name] Evans Primary Care Joydan Gretzinger: PA Zenovia Jordan, NO Other Clinician: Referring Shykeria Sakamoto: Treating Froylan Hobby/Extender: Tilda Franco in Treatment: 0 Encounter Discharge Information Items Discharge Condition: Stable Ambulatory Status: Ambulatory Discharge Destination: Home Transportation: Private Auto Accompanied By: self Schedule Follow-up Appointment: Yes Clinical Summary of Care: Patient Declined Electronic Signature(s) Signed: 09/28/2022 4:10:29 PM By: Fonnie Mu RN Entered By: Fonnie Mu on 09/28/2022 09:42:24 [name] Evans, [name] Evans Lank ([id number]) [id number] Page 4 of 12 -------------------------------------------------------------------------------- Lower Extremity Assessment Details Patient Name: Date of Service: [name] Evans, [name] Evans 09/28/2022 8:00 A M Medical Record Number: [id number] Patient Account Number: [id number] Date of Birth/Sex: Treating RN: [birth year] (46 y.o. [name] Evans Primary Care Fiora Weill: PA TIENT, NO Other Clinician: Referring Caleigh Rabelo: Treating Shemeika Starzyk/Extender: Tilda Franco in Treatment: 0 Edema Assessment Assessed: [Left: Yes] [Right: No] Edema: [Left: Ye] [Right: s] Calf Left: Right: Point of Measurement: 28 cm From Medial Instep 33.5 cm Ankle Left: Right: Point of Measurement: 10 cm From Medial Instep 24.2 cm Knee To Floor Left: Right: From Medial Instep 40 cm Vascular Assessment Pulses: Dorsalis Pedis Palpable: [Left:Yes] Blood Pressure: Brachial: [Left:165] Ankle: [Left:Dorsalis Pedis: 144 0.87] Electronic Signature(s) Signed: 09/30/2022 8:21:58 AM By: Redmond Pulling RN, BSN Entered By: Redmond Pulling on 09/28/2022  08:49:16 -------------------------------------------------------------------------------- Multi Wound Chart Details Patient Name: Date of Service: [name] Evans 09/28/2022 8:00 A M Medical Record Number: [id number] Patient Account Number: [id number] Date of Birth/Sex: Treating RN: [birth year] (47 y.o. F) Primary Care Zayne Marovich: PA TIENT, NO Other Clinician: Referring Haru Shaff: Treating Morgane Joerger/Extender: Tilda Franco in Treatment: 0 Vital Signs Height(in): 65 Pulse(bpm): 99 Weight(lbs): 115 Blood Pressure(mmHg): 165/91 Body Mass Index(BMI): 19.1 Temperature(F): 99.0 Respiratory Rate(breaths/min): 18 [1:Photos:] [N/A:N/A [id number] Page 5 of 12] Left, Medial Lower Leg Left, Dorsal Foot N/A Wound Location: Laceration Gradually Appeared N/A Wounding Event: Venous Leg Ulcer Venous Leg Ulcer N/A Primary Etiology: Peripheral Venous Disease Peripheral Venous  Disease N/A Comorbid History: 04/16/2022 07/17/2022 N/A Date Acquired: 0 0 N/A Weeks of Treatment: Open Open N/A Wound Status: No No N/A Wound Recurrence: 7x4x0.2 2.2x0.8x0.1 N/A Measurements L x W x D (cm) 21.991 1.382 N/A A (cm) : rea 4.398 0.138 N/A Volume (cm) : Full Thickness Without Exposed Full Thickness Without Exposed N/A Classification: Support Structures Support Structures Medium Medium N/A Exudate Amount: Serosanguineous Serosanguineous N/A Exudate Type: red, brown red, brown N/A Exudate Color: Large (67-100%) Medium (34-66%) N/A Granulation Amount: Red Red N/A Granulation Quality: Small (1-33%) Medium (34-66%) N/A Necrotic Amount: Fat Layer (Subcutaneous Tissue): Yes Fat Layer (Subcutaneous Tissue): Yes N/A Exposed Structures: Fascia: No Fascia: No Tendon: No Tendon: No Muscle: No Muscle: No Joint: No Joint: No Bone: No Bone: No None None N/A Epithelialization: No Abnormalities Noted N/A Periwound Skin Moisture: Erythema: Yes N/A Periwound Skin  Color: Circumferential N/A N/A Erythema Location: N/A No Abnormality N/A Temperature: Treatment Notes Wound #1 (Lower Leg) Wound Laterality: Left, Medial Cleanser Soap and Water Discharge Instruction: May shower and wash wound with dial antibacterial soap and water prior to dressing change. Wound Cleanser Discharge Instruction: Cleanse the wound with wound cleanser prior to applying a clean dressing using gauze sponges, not tissue or cotton balls. Peri-Wound Care Topical Primary Dressing Hydrofera Blue Ready Foam, 4x5 in Discharge Instruction: Apply to wound bed as instructed MediHoney Gel, tube 1.5 (oz) Discharge Instruction: Apply to wound bed as instructed Secondary Dressing ABD Pad, 5x9 Discharge Instruction: Apply over primary dressing as directed. Woven Gauze Sponge, Non-Sterile 4x4 in Discharge Instruction: Apply over primary dressing as directed. Secured With [employer], 4.5x3.1 (in/yd) Discharge Instruction: Secure with Kerlix as directed. 62M Medipore H Soft Cloth Surgical T ape, 4 x 10 (in/yd) Discharge Instruction: Secure with tape as directed. tubi grip Size D Discharge Instruction: apply in morning and remove at night Compression Wrap Compression Stockings Add-Ons Wound #2 (Foot) Wound Laterality: Dorsal, Left Cleanser Macknight, [name] Evans Lank ([id number]) [id number] Page 6 of 12 Soap and Water Discharge Instruction: May shower and wash wound with dial antibacterial soap and water prior to dressing change. Wound Cleanser Discharge Instruction: Cleanse the wound with wound cleanser prior to applying a clean dressing using gauze sponges, not tissue or cotton balls. Peri-Wound Care Topical Primary Dressing Hydrofera Blue Ready Foam, 4x5 in Discharge Instruction: Apply to wound bed as instructed MediHoney Gel, tube 1.5 (oz) Discharge Instruction: Apply to wound bed as instructed Secondary Dressing ABD Pad, 5x9 Discharge  Instruction: Apply over primary dressing as directed. Woven Gauze Sponge, Non-Sterile 4x4 in Discharge Instruction: Apply over primary dressing as directed. Secured With [employer], 4.5x3.1 (in/yd) Discharge Instruction: Secure with Kerlix as directed. 62M Medipore H Soft Cloth Surgical T ape, 4 x 10 (in/yd) Discharge Instruction: Secure with tape as directed. tubi grip Size D Discharge Instruction: apply in morning and remove at night Compression Wrap Compression Stockings Add-Ons Electronic Signature(s) Signed: 09/28/2022 1:13:56 PM By: Geralyn Corwin DO Entered By: Geralyn Corwin on 09/28/2022 10:45:16 -------------------------------------------------------------------------------- Multi-Disciplinary Care Plan Details Patient Name: Date of Service: BRESLYN, ABDO 09/28/2022 8:00 A M Medical Record Number: [id number] Patient Account Number: [id number] Date of Birth/Sex: Treating RN: 12-Dec-1975 (46 y.o. [name] Evans, [name] Evans Primary Care Korie Streat: PA Zenovia Jordan, NO Other Clinician: Referring Karley Pho: Treating China Deitrick/Extender: Tilda Franco in Treatment: 0 Active Inactive Orientation to the Wound Care Program Nursing Diagnoses: Knowledge deficit related to the wound healing center program Goals: Patient/caregiver will verbalize understanding of the Wound Healing Center Program Date Initiated: 09/28/2022  Target Resolution Date: 10/23/2022 Goal Status: Active Interventions: Provide education on orientation to the wound center [place]clurg, [name] Evans LankJACQUELINE ([id number]) [id number] Page 7 of 12 Notes: Wound/Skin Impairment Nursing Diagnoses: Impaired tissue integrity Knowledge deficit related to ulceration/compromised skin integrity Goals: Patient will have a decrease in wound volume by X% from date: (specify in notes) Date Initiated: 09/28/2022 Target Resolution Date: 10/23/2022 Goal Status: Active Patient/caregiver will verbalize  understanding of skin care regimen Date Initiated: 09/28/2022 Target Resolution Date: 10/24/2022 Goal Status: Active Ulcer/skin breakdown will have a volume reduction of 30% by week 4 Date Initiated: 09/28/2022 Target Resolution Date: 10/23/2022 Goal Status: Active Interventions: Assess patient/caregiver ability to obtain necessary supplies Assess patient/caregiver ability to perform ulcer/skin care regimen upon admission and as needed Assess ulceration(s) every visit Notes: Electronic Signature(s) Signed: 09/28/2022 4:10:29 PM By: Fonnie MuBreedlove, Lauren RN Entered By: Fonnie MuBreedlove, [name] Evans on 09/28/2022 08:35:08 -------------------------------------------------------------------------------- Pain Assessment Details Patient Name: Date of Service: [name] PettiesITTS, JA CQ UELINE 09/28/2022 8:00 A M Medical Record Number: [id number] Patient Account Number: [id number] Date of Birth/Sex: Treating RN: 01/27/1976 (46 y.o. [name] GovernF) Palmer, Carrie Primary Care Binyomin Brann: PA Zenovia JordanIENT, NO Other Clinician: Referring Derenda Giddings: Treating Sharnette Kitamura/Extender: Tilda FrancoHoffman, Jessica Weeks in Treatment: 0 Active Problems Location of Pain Severity and Description of Pain Patient Has Paino Yes Site Locations Rate the pain. Current Pain Level: 2 Worst Pain Level: 8 Character of Pain Describe the Pain: Burning Pain Management and Medication Current Pain Management: Celedonio MiyamotoITTS, Deairra ([id number]) [id number] Page 8 of 12 Electronic Signature(s) Signed: 09/30/2022 8:21:58 AM By: Redmond PullingPalmer, Carrie RN, BSN Entered By: Redmond PullingPalmer, Carrie on 09/28/2022 08:32:59 -------------------------------------------------------------------------------- Patient/Caregiver Education Details Patient Name: Date of Service: [name] PettiesITTS, JA CQ UELINE 11/13/2023andnbsp8:00 A M Medical Record Number: [id number] Patient Account Number: [id number] Date of Birth/Gender: Treating RN: 03/22/1976 (46 y.o. [name] RowanF) Breedlove, [name] Evans Primary Care Physician: PA  Zenovia JordanIENT, NO Other Clinician: Referring Physician: Treating Physician/Extender: Tilda FrancoHoffman, Jessica Weeks in Treatment: 0 Education Assessment Education Provided To: Patient Education Topics Provided Welcome T The W[facility] Center: o Methods: Explain/Verbal Responses: State content correctly Electronic Signature(s) Signed: 09/28/2022 4:10:29 PM By: Fonnie MuBreedlove, Lauren RN Entered By: Fonnie MuBreedlove, [name] Evans on 09/28/2022 08:35:15 -------------------------------------------------------------------------------- Wound Assessment Details Patient Name: Date of Service: [name] PettiesITTS, JA CQ UELINE 09/28/2022 8:00 A M Medical Record Number: [id number] Patient Account Number: [id number] Date of Birth/Sex: Treating RN: 11/09/1976 (46 y.o. [name] GovernF) Palmer, Carrie Primary Care Anaiah Mcmannis: PA Zenovia [place]IENT, [place] Other Clinician: Referring Wynonna Fitzhenry: Treating Keyan Folson/Extender: Tilda FrancoHoffman, Jessica Weeks in Treatment: 0 Wound Status Wound Number: 1 Primary Etiology: Venous Leg Ulcer Wound Location: Left, Medial Lower Leg Wound Status: Open Wounding Event: Laceration Comorbid History: Peripheral Venous Disease Date Acquired: 04/16/2022 Weeks Of Treatment: 0 Clustered Wound: No Photos Celedonio MiyamotoITTS, Ysabel ([id number]) [id number] Page 9 of 12 Wound Measurements Length: (cm) 7 Width: (cm) 4 Depth: (cm) 0.2 Area: (cm) 21.991 Volume: (cm) 4.398 % Reduction in Area: % Reduction in Volume: Epithelialization: None Tunneling: No Undermining: No Wound Description Classification: Full Thickness Without Exposed Support Structures Exudate Amount: Medium Exudate Type: Serosanguineous Exudate Color: red, brown Foul Odor After Cleansing: No Slough/Fibrino Yes Wound Bed Granulation Amount: Large (67-100%) Exposed Structure Granulation Quality: Red Fascia Exposed: No Necrotic Amount: Small (1-33%) Fat Layer (Subcutaneous Tissue) Exposed: Yes Necrotic Quality: Adherent Slough Tendon Exposed: No Muscle Exposed:  No Joint Exposed: No Bone Exposed: No Periwound Skin Texture Texture Color No Abnormalities Noted: No No Abnormalities Noted: No Erythema: Yes Moisture Erythema Location: Circumferential No Abnormalities Noted: No Treatment Notes Wound #1 (Lower Leg) Wound Laterality: Left, Medial Cleanser Soap and Water Discharge Instruction:  May shower and wash wound with dial antibacterial soap and water prior to dressing change. Wound Cleanser Discharge Instruction: Cleanse the wound with wound cleanser prior to applying a clean dressing using gauze sponges, not tissue or cotton balls. Peri-Wound Care Topical Primary Dressing Hydrofera Blue Ready Foam, 4x5 in Discharge Instruction: Apply to wound bed as instructed MediHoney Gel, tube 1.5 (oz) Discharge Instruction: Apply to wound bed as instructed Secondary Dressing ABD Pad, 5x9 Discharge Instruction: Apply over primary dressing as directed. Woven Gauze Sponge, Non-Sterile 4x4 in Discharge Instruction: Apply over primary dressing as directed. Secured With [employer], 4.5x3.1 (in/yd) Discharge Instruction: Secure with Kerlix as directed. 67M Medipore H Soft Cloth Surgical T ape, 4 x 10 (in/yd) Discharge Instruction: Secure with tape as directed. LELANI, [name] Evans ([id number]) [id number] Page 10 of 12 tubi grip Size D Discharge Instruction: apply in morning and remove at night Compression Wrap Compression Stockings Add-Ons Electronic Signature(s) Signed: 09/29/2022 5:53:09 PM By: Shawn Stall RN, BSN Signed: 09/30/2022 8:21:58 AM By: Redmond Pulling RN, BSN Entered By: Shawn Stall on 09/28/2022 08:41:32 -------------------------------------------------------------------------------- Wound Assessment Details Patient Name: Date of Service: [name] Evans, [name] Evans 09/28/2022 8:00 A M Medical Record Number: [id number] Patient Account Number: [id number] Date of Birth/Sex: Treating RN: 14-Aug-1976 (46 y.o. [name] Evans Primary Care Madylin Fairbank: PA TIENT, NO Other Clinician: Referring Bellamia Ferch: Treating Hiram Mciver/Extender: Tilda Franco in Treatment: 0 Wound Status Wound Number: 2 Primary Etiology: Venous Leg Ulcer Wound Location: Left, Dorsal Foot Wound Status: Open Wounding Event: Gradually Appeared Comorbid History: Peripheral Venous Disease Date Acquired: 07/17/2022 Weeks Of Treatment: 0 Clustered Wound: No Photos Wound Measurements Length: (cm) 2.2 Width: (cm) 0.8 Depth: (cm) 0.1 Area: (cm) 1.382 Volume: (cm) 0.138 % Reduction in Area: % Reduction in Volume: Epithelialization: None Tunneling: No Undermining: No Wound Description Classification: Full Thickness Without Exposed Suppor Exudate Amount: Medium Exudate Type: Serosanguineous Exudate Color: red, brown t Structures Foul Odor After Cleansing: No Slough/Fibrino Yes Wound Bed Granulation Amount: Medium (34-66%) Exposed Structure Granulation Quality: Red Fascia Exposed: No Necrotic Amount: Medium (34-66%) Fat Layer (Subcutaneous Tissue) Exposed: Yes Necrotic Quality: Adherent Slough Tendon Exposed: No Muscle Exposed: No Joint Exposed: No Bone Exposed: No [name] Evans, [name] Evans ([id number]) [id number] Page 11 of 12 Periwound Skin Texture Texture Color No Abnormalities Noted: No No Abnormalities Noted: No Moisture Temperature / Pain No Abnormalities Noted: Yes Temperature: No Abnormality Treatment Notes Wound #2 (Foot) Wound Laterality: Dorsal, Left Cleanser Soap and Water Discharge Instruction: May shower and wash wound with dial antibacterial soap and water prior to dressing change. Wound Cleanser Discharge Instruction: Cleanse the wound with wound cleanser prior to applying a clean dressing using gauze sponges, not tissue or cotton balls. Peri-Wound Care Topical Primary Dressing Hydrofera Blue Ready Foam, 4x5 in Discharge Instruction: Apply to wound bed as  instructed MediHoney Gel, tube 1.5 (oz) Discharge Instruction: Apply to wound bed as instructed Secondary Dressing ABD Pad, 5x9 Discharge Instruction: Apply over primary dressing as directed. Woven Gauze Sponge, Non-Sterile 4x4 in Discharge Instruction: Apply over primary dressing as directed. Secured With [employer], 4.5x3.1 (in/yd) Discharge Instruction: Secure with Kerlix as directed. 67M Medipore H Soft Cloth Surgical T ape, 4 x 10 (in/yd) Discharge Instruction: Secure with tape as directed. tubi grip Size D Discharge Instruction: apply in morning and remove at night Compression Wrap Compression Stockings Add-Ons Electronic Signature(s) Signed: 09/29/2022 5:53:09 PM By: Shawn Stall RN, BSN Signed: 09/30/2022 8:21:58 AM By: Redmond Pulling RN, BSN Entered By: Shawn Stall on 09/28/2022 08:42:01 --------------------------------------------------------------------------------  Vitals Details Patient Name: Date of Service: [name] Evans, [name] Evans 09/28/2022 8:00 A M Medical Record Number: [id number] Patient Account Number: [id number] Date of Birth/Sex: Treating RN: 1976-09-26 (46 y.o. [name] Evans Primary Care Contina Strain: PA Zenovia Jordan, NO Other Clinician: Referring Amrie Gurganus: Treating Lourie Retz/Extender: Tilda Franco in Treatment: 0 Vital Signs Time Taken: 08:33 Temperature (F): 99.0 Height (in): 65 Pulse (bpm): 99 Source: Stated Respiratory Rate (breaths/min): 18 Woodring, Faithanne ([id number]) [id number] Page 12 of 12 Weight (lbs): 115 Blood Pressure (mmHg): 165/91 Source: Stated Reference Range: 80 - 120 mg / dl Body Mass Index (BMI): 19.1 Electronic Signature(s) Signed: 09/30/2022 8:21:58 AM By: Redmond Pulling RN, BSN Entered By: Redmond Pulling on 09/28/2022 08:34:23

## 2022-09-30 NOTE — Progress Notes (Signed)
[name], Evans ([id number]) [id number] Page 1 of 8 Visit Report for 09/28/2022 Chief Complaint Document Details Patient Name: Date of Service: [name] Evans, [name] Evans 09/28/2022 8:00 A M Medical Record Number: [id number] Patient Account Number: [id number] Date of Birth/Sex: Treating RN: 1976/06/16 (46 y.o. F) Primary Care Provider: PA Zenovia Jordan, NO Other Clinician: Referring Provider: Treating Provider/Extender: Tilda Franco in Treatment: 0 Information Obtained from: Patient Chief Complaint 09/28/2022; left lower extremity wound Electronic Signature(s) Signed: 09/28/2022 1:13:56 PM By: Geralyn Corwin DO Entered By: Geralyn Corwin on 09/28/2022 10:45:41 -------------------------------------------------------------------------------- HPI Details Patient Name: Date of Service: [name] Evans, [name] Evans 09/28/2022 8:00 A M Medical Record Number: [id number] Patient Account Number: [id number] Date of Birth/Sex: Treating RN: Jan 02, 1976 (46 y.o. F) Primary Care Provider: PA Zenovia Jordan, NO Other Clinician: Referring Provider: Treating Provider/Extender: Tilda Franco in Treatment: 0 History of Present Illness HPI Description: Admission 09/28/2022 Ms. 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 She states that her mother had a history of venous insufficiency with wounds as well. She has been using bacitracin to the wound bed. She has chronic pain to the area. She was seen in the ED for this issue on 10/21 and 10/24. She completed a 10-day course of doxycycline and Keflex for this issue. She states she works at [employer] and is on her feet for over 12 hours a day. She is not using compression therapy. She currently denies systemic signs of infection. Electronic Signature(s) Signed:  09/28/2022 1:13:56 PM By: Geralyn Corwin DO Entered By: Geralyn Corwin on 09/28/2022 10:51:28 -------------------------------------------------------------------------------- Physical Exam Details Patient Name: Date of Service: [name] Evans, [name] Evans 09/28/2022 8:00 A M Medical Record Number: [id number] Patient Account Number: [id number] Date of Birth/Sex: Treating RN: 1976/09/16 (46 y.o. F) Primary Care Provider: PA Fidela Juneau Other ClinicianHARRIETT, Evans ([id number]) [id number] Page 2 of 8 Referring Provider: Treating Provider/Extender: Tilda Franco in Treatment: 0 Constitutional respirations regular, non-labored and within target range for patient.. Cardiovascular 2+ dorsalis pedis/posterior tibialis pulses. Psychiatric pleasant and cooperative. Notes Left lower extremity: 2+ pitting edema to the knee. T the distal medial aspect there is a large open wound with nonviable surface. No increased warmth, o erythema or purulent drainage. There is a small open wound to the left dorsal foot also with a nonviable surface. Overall areas appear unhealthy. Electronic Signature(s) Signed: 09/28/2022 1:13:56 PM By: Geralyn Corwin DO Entered By: Geralyn Corwin on 09/28/2022 10:54:19 -------------------------------------------------------------------------------- Physician Orders Details Patient Name: Date of Service: [name] Evans, [name] Evans 09/28/2022 8:00 A M Medical Record Number: [id number] Patient Account Number: [id number] Date of Birth/Sex: Treating RN: [birth year] (46 y.o. [name], Lauren Primary Care Provider: PA TIENT, NO Other Clinician: Referring Provider: Treating Provider/Extender: Tilda Franco in Treatment: 0 Verbal / Phone Orders: No Diagnosis Coding ICD-10 Coding Code Description [phone] Non-pressure chronic ulcer of other part of left lower leg with fat layer exposed I87.312 Chronic venous hypertension (idiopathic)  with ulcer of left lower extremity Follow-up Appointments Return Appointment in 1 week. Anesthetic (In clinic) Topical Lidocaine 5% applied to wound bed Bathing/ Shower/ Hygiene May shower with protection but do not get wound dressing(s) wet. Edema Control - Lymphedema / SCD / Other Elevate legs to the level of the heart or above for 30 minutes daily and/or when sitting, a frequency of: Avoid standing for  long periods of time. Wound Treatment Wound #1 - Lower Leg Wound Laterality: Left, Medial Cleanser: Soap and Water 1 x Per [id number] Days Discharge Instructions: May shower and wash wound with dial antibacterial soap and water prior to dressing change. Cleanser: Wound Cleanser (DME) (Generic) 1 x Per Day/15 Days Discharge Instructions: Cleanse the wound with wound cleanser prior to applying a clean dressing using gauze sponges, not tissue or cotton balls. Prim Dressing: Hydrofera Blue Ready Foam, 4x5 in (DME) (Generic) 1 x Per Day/15 Days ary Discharge Instructions: Apply to wound bed as instructed Prim Dressing: MediHoney Gel, tube 1.5 (oz) 1 x Per Day/15 Days ary Discharge Instructions: Apply to wound bed as instructed Secondary Dressing: ABD Pad, 5x9 (DME) (Generic) 1 x Per Day/15 Days Discharge Instructions: Apply over primary dressing as directed. [name] Evans, [name] Evans ([id number]) [id number] Page 3 of 8 Secondary Dressing: Woven Gauze Sponge, Non-Sterile 4x4 in (DME) (Generic) 1 x Per Day/15 Days Discharge Instructions: Apply over primary dressing as directed. Secured With: [employer], 4.5x3.1 (in/yd) (DME) (Generic) 1 x Per Day/15 Days Discharge Instructions: Secure with Kerlix as directed. Secured With: 32M Medipore H Soft Cloth Surgical T ape, 4 x 10 (in/yd) (DME) (Generic) 1 x Per Day/15 Days Discharge Instructions: Secure with tape as directed. Secured With: tubi grip Size D 1 x Per Day/15 Days Discharge Instructions: apply in morning and  remove at night Wound #2 - Foot Wound Laterality: Dorsal, Left Cleanser: Soap and Water 1 x Per Day/15 Days Discharge Instructions: May shower and wash wound with dial antibacterial soap and water prior to dressing change. Cleanser: Wound Cleanser (DME) (Generic) 1 x Per Day/15 Days Discharge Instructions: Cleanse the wound with wound cleanser prior to applying a clean dressing using gauze sponges, not tissue or cotton balls. Prim Dressing: Hydrofera Blue Ready Foam, 4x5 in (DME) (Generic) 1 x Per Day/15 Days ary Discharge Instructions: Apply to wound bed as instructed Prim Dressing: MediHoney Gel, tube 1.5 (oz) 1 x Per Day/15 Days ary Discharge Instructions: Apply to wound bed as instructed Secondary Dressing: ABD Pad, 5x9 (DME) (Generic) 1 x Per Day/15 Days Discharge Instructions: Apply over primary dressing as directed. Secondary Dressing: Woven Gauze Sponge, Non-Sterile 4x4 in (DME) (Generic) 1 x Per Day/15 Days Discharge Instructions: Apply over primary dressing as directed. Secured With: [employer], 4.5x3.1 (in/yd) (DME) (Generic) 1 x Per Day/15 Days Discharge Instructions: Secure with Kerlix as directed. Secured With: 32M Medipore H Soft Cloth Surgical T ape, 4 x 10 (in/yd) (DME) (Generic) 1 x Per Day/15 Days Discharge Instructions: Secure with tape as directed. Secured With: tubi grip Size D 1 x Per Day/15 Days Discharge Instructions: apply in morning and remove at night Laboratory naerobe culture (MICRO) - PCR culture LEft Leg Bacteria identified in Unspecified specimen by A LOINC Code: [id number] Convenience Name: Anaerobic culture Patient Medications llergies: Toradol A Notifications Medication Indication Start End PRN debridements/pain11/13/2023 lidocaine DOSE topical 5 % gel - gel topical Electronic Signature(s) Signed: 09/28/2022 1:13:56 PM By: Kalman Shan DO Entered By: Kalman Shan on 09/28/2022  10:54:29 -------------------------------------------------------------------------------- Problem List Details Patient Name: Date of Service: [name] Evans, [name] Evans 09/28/2022 8:00 A M Medical Record Number: [id number] Patient Account Number: [id number] Date of Birth/Sex: Treating RN: 07-03-1976 (46 y.o. Ileana Vigliotti, [name] ([id number]) [id number] Page 4 of 8 Primary Care Provider: PA TIENT, NO Other Clinician: Referring Provider: Treating Provider/Extender: Yaakov Guthrie in Treatment: 0 Active Problems ICD-10 Encounter Code Description Active Date MDM Diagnosis L97.822 Non-pressure chronic ulcer of other  part of left lower leg with fat layer 09/28/2022 No Yes exposed I87.312 Chronic venous hypertension (idiopathic) with ulcer of left lower extremity 09/28/2022 No Yes Inactive Problems Resolved Problems Electronic Signature(s) Signed: 09/28/2022 1:13:56 PM By: Kalman Shan DO Entered By: Kalman Shan on 09/28/2022 10:45:09 -------------------------------------------------------------------------------- Progress Note Details Patient Name: Date of Service: [name] Evans, [name] Evans 09/28/2022 8:00 A M Medical Record Number: [id number] Patient Account Number: [id number] Date of Birth/Sex: Treating RN: [birth year] (46 y.o. F) Primary Care Provider: PA Haig Prophet, NO Other Clinician: Referring Provider: Treating Provider/Extender: Yaakov Guthrie in Treatment: 0 Subjective Chief Complaint Information obtained from Patient 09/28/2022; left lower extremity wound History of Present Illness (HPI) Admission 09/28/2022 Ms. 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 She states that her mother had a history of venous insufficiency with wounds as well. She has been  using bacitracin to the wound bed. She has chronic pain to the area. She was seen in the ED for this issue on 10/21 and 10/24. She completed a 10-day course of doxycycline and Keflex for this issue. She states she works at [employer] and is on her feet for over 12 hours a day. She is not using compression therapy. She currently denies systemic signs of infection. Patient History Information obtained from Patient, Chart. Allergies Toradol Family History Unknown History, Cancer - Father, Hypertension - Mother, Lung Disease - Father, Thyroid Problems - Mother,Siblings, No family history of Tuberculosis. Social History Current every day smoker - e-ciggs, Alcohol Use - Never, Drug Use - No History, Caffeine Use - Rarely. Medical History Cardiovascular Patient has history of Peripheral Venous Disease Hospitalization/Surgery History - IVC filter. - tubal ligation. [name] Evans, [name] Evans ([id number]) [id number] Page 5 of 8 Medical A Surgical History Notes nd Hematologic/Lymphatic DVT Musculoskeletal scoliosis Review of Systems (ROS) Constitutional Symptoms (General Health) Denies complaints or symptoms of Fatigue, Fever, Chills, Marked Weight Change. Eyes Denies complaints or symptoms of Dry Eyes, Vision Changes, Glasses / Contacts. Ear/Nose/Mouth/Throat Denies complaints or symptoms of Chronic sinus problems or rhinitis. Cardiovascular Denies complaints or symptoms of Chest pain. Objective Constitutional respirations regular, non-labored and within target range for patient.. Vitals Time Taken: 8:33 AM, Height: 65 in, Source: Stated, Weight: 115 lbs, Source: Stated, BMI: 19.1, Temperature: 99.0 F, Pulse: 99 bpm, Respiratory Rate: 18 breaths/min, Blood Pressure: 165/91 mmHg. Cardiovascular 2+ dorsalis pedis/posterior tibialis pulses. Psychiatric pleasant and cooperative. General Notes: Left lower extremity: 2+ pitting edema to the knee. T the distal medial aspect  there is a large open wound with nonviable surface. No increased o warmth, erythema or purulent drainage. There is a small open wound to the left dorsal foot also with a nonviable surface. Overall areas appear unhealthy. Integumentary (Hair, Skin) Wound #1 status is Open. Original cause of wound was Laceration. The date acquired was: 04/16/2022. The wound is located on the Left,Medial Lower Leg. The wound measures 7cm length x 4cm width x 0.2cm depth; 21.991cm^2 area and 4.398cm^3 volume. There is Fat Layer (Subcutaneous Tissue) exposed. There is no tunneling or undermining noted. There is a medium amount of serosanguineous drainage noted. There is large (67-100%) red granulation within the wound bed. There is a small (1-33%) amount of necrotic tissue within the wound bed including Adherent Slough. The periwound skin appearance exhibited: Erythema. The surrounding wound skin color is noted with  erythema which is circumferential. Wound #2 status is Open. Original cause of wound was Gradually Appeared. The date acquired was: 07/17/2022. The wound is located on the Left,Dorsal Foot. The wound measures 2.2cm length x 0.8cm width x 0.1cm depth; 1.382cm^2 area and 0.138cm^3 volume. There is Fat Layer (Subcutaneous Tissue) exposed. There is no tunneling or undermining noted. There is a medium amount of serosanguineous drainage noted. There is medium (34-66%) red granulation within the wound bed. There is a medium (34-66%) amount of necrotic tissue within the wound bed including Adherent Slough. The periwound skin appearance had no abnormalities noted for moisture. Periwound temperature was noted as No Abnormality. 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 The wound beds have a nonviable surface with significant bioburden. She has chronic pain. No signs of soft tissue infection. I think she would benefit from [facility] topical antibiotic and a PCR culture was obtained today. For now I recommended Hydrofera Blue and Medihoney under compression therapy. Due to pain she cannot even tolerate Tubigrip. I recommended an Ace bandage. We also gave her measurements to order new compression stockings that she will need in the near future after her wound heals. Follow-up in 1 week. Plan Follow-up Appointments: Return Appointment in 1 week. Anesthetic: (In clinic) Topical Lidocaine 5% applied to wound bed Bathing/ Shower/ Hygiene: May shower with protection but do not get wound dressing(s) wet. Edema Control - Lymphedema / SCD / OtherIMELDA, [name] Evans ([id number]) [id number] Page 6 of 8 Elevate legs to the level of the heart or above for 30 minutes daily and/or when sitting, a frequency of: Avoid standing for long periods of time. Laboratory ordered were: Anaerobic culture - PCR culture LEft Leg The following medication(s) was prescribed: lidocaine topical 5 % gel gel topical for PRN debridements/pain was prescribed at facility WOUND #1: - Lower Leg Wound Laterality: Left, Medial Cleanser: Soap and Water 1 x Per Day/15 Days Discharge Instructions: May shower and wash wound with dial antibacterial soap and water prior to dressing change. Cleanser: Wound Cleanser (DME) (Generic) 1 x Per Day/15 Days Discharge Instructions: Cleanse the wound with wound cleanser prior to applying a clean dressing using gauze sponges, not tissue or cotton balls. Prim Dressing: Hydrofera Blue Ready Foam, 4x5 in (DME) (Generic) 1 x Per Day/15 Days ary Discharge Instructions: Apply to wound bed as instructed Prim Dressing: MediHoney Gel, tube 1.5 (oz) 1 x Per Day/15 Days ary Discharge Instructions: Apply to wound bed as  instructed Secondary Dressing: ABD Pad, 5x9 (DME) (Generic) 1 x Per Day/15 Days Discharge Instructions: Apply over primary dressing as directed. Secondary Dressing: Woven Gauze Sponge, Non-Sterile 4x4 in (DME) (Generic) 1 x Per Day/15 Days Discharge Instructions: Apply over primary dressing as directed. Secured With: [employer], 4.5x3.1 (in/yd) (DME) (Generic) 1 x Per Day/15 Days Discharge Instructions: Secure with Kerlix as directed. Secured With: 51M Medipore H Soft Cloth Surgical T ape, 4 x 10 (in/yd) (DME) (Generic) 1 x Per Day/15 Days Discharge Instructions: Secure with tape as directed. Secured With: tubi grip Size D 1 x Per Day/15 Days Discharge Instructions: apply in morning and remove at night WOUND #2: - Foot Wound Laterality: Dorsal, Left Cleanser: Soap and Water 1 x Per Day/15 Days Discharge Instructions: May shower and wash wound with  dial antibacterial soap and water prior to dressing change. Cleanser: Wound Cleanser (DME) (Generic) 1 x Per Day/15 Days Discharge Instructions: Cleanse the wound with wound cleanser prior to applying a clean dressing using gauze sponges, not tissue or cotton balls. Prim Dressing: Hydrofera Blue Ready Foam, 4x5 in (DME) (Generic) 1 x Per Day/15 Days ary Discharge Instructions: Apply to wound bed as instructed Prim Dressing: MediHoney Gel, tube 1.5 (oz) 1 x Per Day/15 Days ary Discharge Instructions: Apply to wound bed as instructed Secondary Dressing: ABD Pad, 5x9 (DME) (Generic) 1 x Per Day/15 Days Discharge Instructions: Apply over primary dressing as directed. Secondary Dressing: Woven Gauze Sponge, Non-Sterile 4x4 in (DME) (Generic) 1 x Per Day/15 Days Discharge Instructions: Apply over primary dressing as directed. Secured With: [employer], 4.5x3.1 (in/yd) (DME) (Generic) 1 x Per Day/15 Days Discharge Instructions: Secure with Kerlix as directed. Secured With: 24M Medipore H Soft Cloth Surgical T ape, 4 x 10 (in/yd) (DME)  (Generic) 1 x Per Day/15 Days Discharge Instructions: Secure with tape as directed. Secured With: tubi grip Size D 1 x Per Day/15 Days Discharge Instructions: apply in morning and remove at night 1. Medihoney and Hydrofera Blue 2. PCR culture[facility] topical antibiotic 3. Order compression stockings 4. Ace bandage for now- will benefit from compression wraps in the near future Electronic Signature(s) Signed: 09/28/2022 1:13:56 PM By: Kalman Shan DO Entered By: Kalman Shan on 09/28/2022 11:11:11 -------------------------------------------------------------------------------- HxROS Details Patient Name: Date of Service: [name] Evans 09/28/2022 8:00 A M Medical Record Number: [id number] Patient Account Number: [id number] Date of Birth/Sex: Treating RN: 1976-04-09 (46 y.o. Tonita Phoenix, Lauren Primary Care Provider: PA Haig Prophet, NO Other Clinician: Referring Provider: Treating Provider/Extender: Yaakov Guthrie in Treatment: 0 Information Obtained From Patient Chart Constitutional Symptoms (General Health) Complaints and Symptoms: Negative for: Fatigue; Fever; Chills; Marked Weight Change Eyes [name] Evans, [name] Evans ([id number]) [phone].pdf Page 7 of 8 Complaints and Symptoms: Negative for: Dry Eyes; Vision Changes; Glasses / Contacts Ear/Nose/Mouth/Throat Complaints and Symptoms: Negative for: Chronic sinus problems or rhinitis Cardiovascular Complaints and Symptoms: Negative for: Chest pain Medical History: Positive for: Peripheral Venous Disease Hematologic/Lymphatic Medical History: Past Medical History Notes: DVT Musculoskeletal Medical History: Past Medical History Notes: scoliosis Immunizations Pneumococcal Vaccine: Received Pneumococcal Vaccination: No Implantable Devices None Hospitalization / Surgery History Type of Hospitalization/Surgery IVC filter tubal ligation Family and Social History Unknown History: Yes;  Cancer: Yes - Father; Hypertension: Yes - Mother; Lung Disease: Yes - Father; Thyroid Problems: Yes - Mother,Siblings; Tuberculosis: No; Current every day smoker - e-ciggs; Alcohol Use: Never; Drug Use: No History; Caffeine Use: Rarely; Financial Concerns: No; Food, Clothing or Shelter Needs: No; Support System Lacking: No; Transportation Concerns: No Electronic Signature(s) Signed: 09/28/2022 1:13:56 PM By: Kalman Shan DO Signed: 09/28/2022 4:10:29 PM By: Rhae Hammock RN Signed: 09/30/2022 8:21:58 AM By: Sharyn Creamer RN, BSN Entered By: Sharyn Creamer on 09/28/2022 08:28:41 -------------------------------------------------------------------------------- [place] Details Patient Name: Date of Service: [name] Evans, [name] Evans 09/28/2022 Medical Record Number: [id number] Patient Account Number: [id number] Date of Birth/Sex: Treating RN: 08-09-1976 (46 y.o. Tonita Phoenix, Lauren Primary Care Provider: PA TIENT, NO Other Clinician: Referring Provider: Treating Provider/Extender: Yaakov Guthrie in Treatment: 0 Diagnosis Coding ICD-10 Codes Code Description [phone] Non-pressure chronic ulcer of other part of left lower leg with fat layer exposed I87.312 Chronic venous hypertension (idiopathic) with ulcer of left lower extremity Haddix, Sabena ([id number]) [id number] Page 8 of 8 Facility Procedures : CPT4 Code: [id number] Description: [phone] - WOUND CARE VISIT-LEV 5 EST PT  Modifier: Quantity: 1 Physician Procedures : CPT4 Code Description Modifier [id number] - WC PHYS LEVEL 4 - NEW PT ICD-10 Diagnosis Description L97.822 Non-pressure chronic ulcer of other part of left lower leg with fat layer exposed I87.312 Chronic venous hypertension (idiopathic) with ulcer  of left lower extremity Quantity: 1 Electronic Signature(s) Signed: 09/28/2022 1:13:56 PM By: Kalman Shan DO Entered By: Kalman Shan on 09/28/2022 11:11:30

## 2022-09-30 NOTE — Progress Notes (Signed)
HEBAH, BOGOSIAN ([id number]) [phone] [id number] Page 1 of 4 Visit Report for 09/28/2022 Abuse Risk Screen Details Patient Name: Date of Service: [name] Evans, [name] Evans 09/28/2022 8:00 A M Medical Record Number: [id number] Patient Account Number: [id number] Date of Birth/Sex: Treating RN: [birth year] (46 y.o. [name] Evans [name] Evans: PA [name] Evans, NO Other Clinician: Referring [name] Evans: Treating [name] Evans/Extender: [name] Evans in Treatment: 0 Abuse Risk Screen Items Answer ABUSE RISK SCREEN: Has anyone close to you tried to hurt or harm you recentlyo No Do you feel uncomfortable with anyone in your familyo No Has anyone forced you do things that you didnt want to doo No Electronic Signature(s) Signed: 09/30/2022 8:21:58 AM By: Redmond Pulling RN, BSN Entered By: Redmond Pulling on 09/28/2022 08:28:50 -------------------------------------------------------------------------------- Activities of Daily Living Details Patient Name: Date of Service: [name] Evans, [name] Evans 09/28/2022 8:00 A M Medical Record Number: [id number] Patient Account Number: [id number] Date of Birth/Sex: Treating RN: [birth year] (46 y.o. [name] Evans Michaiah Maiden: PA [name] Evans, NO Other Clinician: Referring [name] Evans: Treating [name] Evans/Extender: [name] Evans in Treatment: 0 Activities of Daily Living Items Answer Activities of Daily Living (Please select one for each item) Drive Automobile Completely Able T Medications ake Completely Able Use T elephone Completely Able Evans for Appearance Completely Able Use T oilet Completely Able Bath / Shower Completely Able Dress Self Completely Able Feed Self Completely Able Walk Completely Able Get In / Out Bed Completely Able Housework Completely Able Prepare Meals Completely Able Handle Money Completely Able Shop for Self Completely Able Electronic Signature(s) Signed: 09/30/2022 8:21:58 AM By: Redmond Pulling RN, BSN Entered By: Redmond Pulling on 09/28/2022 08:29:15 Celedonio Evans ([id number]) [phone] [id number] Page 2 of 4 -------------------------------------------------------------------------------- Education Screening Details Patient Name: Date of Service: [name] Evans, [name] Evans 09/28/2022 8:00 A M Medical Record Number: [id number] Patient Account Number: [id number] Date of Birth/Sex: Treating RN: May 01, 1976 (46 y.o. [name] Evans [name] Evans: PA [name] Evans, NO Other Clinician: Referring [name] Evans: Treating [name] Evans/Extender: [name] Evans in Treatment: 0 Learning Preferences/Education Level/Primary Language Learning Preference: Explanation, Demonstration, Printed Material Highest Education Level: High School Preferred Language: English Cognitive Barrier Language Barrier: No Translator Needed: No Memory Deficit: No Emotional Barrier: No Cultural/Religious Beliefs Affecting Medical Evans: No Physical Barrier Impaired Vision: No Impaired Hearing: No Decreased Hand dexterity: No Knowledge/Comprehension Knowledge Level: High Comprehension Level: High Ability to understand written instructions: High Ability to understand verbal instructions: High Motivation Anxiety Level: Calm Cooperation: Cooperative Education Importance: Acknowledges Need Interest in Health Problems: Asks Questions Perception: Coherent Willingness to Engage in Self-Management High Activities: Readiness to Engage in Self-Management High Activities: Electronic Signature(s) Signed: 09/30/2022 8:21:58 AM By: Redmond Pulling RN, BSN Entered By: Redmond Pulling on 09/28/2022 08:30:01 -------------------------------------------------------------------------------- Fall Risk Assessment Details Patient Name: Date of Service: [name] Evans 09/28/2022 8:00 A M Medical Record Number: [id number] Patient Account Number: [id number] Date of Birth/Sex: Treating  RN: 04/21/1976 (46 y.o. [name] Evans [name] Evans: PA TIENT, NO Other Clinician: Referring [name] Evans: Treating [name] Evans/Extender: [name] Evans in Treatment: 0 Fall Risk Assessment Items Have you had 2 or more falls in the last 12 monthso 0 No Have you had any fall that resulted in injury in the last 12 monthso 0 No Boddy, Siarah ([id number]) [id number] Page 3 of 4 FALLS RISK SCREEN History of falling - immediate or within 3 months 0 No Secondary diagnosis (Do you have 2 or more medical diagnoseso) 0 No Ambulatory aid None/bed rest/wheelchair/nurse 0 Yes Crutches/cane/walker 0 No Furniture  0 No Intravenous therapy Access/Saline/Heparin Lock 0 No Gait/Transferring Normal/ bed rest/ wheelchair 0 Yes Weak (short steps with or without shuffle, stooped but able to lift head while walking, may seek 0 No support from furniture) Impaired (short steps with shuffle, may have difficulty arising from chair, head down, impaired 0 No balance) Mental Status Oriented to own ability 0 Yes Electronic Signature(s) Signed: 09/30/2022 8:21:58 AM By: Redmond Pulling RN, BSN Entered By: Redmond Pulling on 09/28/2022 08:30:15 -------------------------------------------------------------------------------- Foot Assessment Details Patient Name: Date of Service: [name] Evans, [name] Evans 09/28/2022 8:00 A M Medical Record Number: [id number] Patient Account Number: [id number] Date of Birth/Sex: Treating RN: 1976-08-09 (46 y.o. [name] Evans [name] Evans: PA TIENT, NO Other Clinician: Referring [name] Evans: Treating [name] Evans/Extender: [name] Evans in Treatment: 0 Foot Assessment Items Site Locations + = Sensation present, - = Sensation absent, C = Callus, U = Ulcer R = Redness, W = Warmth, M = Maceration, PU = Pre-ulcerative lesion F = Fissure, S = Swelling, D = Dryness Assessment Right: Left: Other Deformity: No No Prior Foot  Ulcer: No No Prior Amputation: No No Charcot Joint: No No Ambulatory Status: Gait: [name] Evans ([id number]) [id number] Page 4 of 4 Electronic Signature(s) Signed: 09/30/2022 8:21:58 AM By: Redmond Pulling RN, BSN Entered By: Redmond Pulling on 09/28/2022 08:51:46 -------------------------------------------------------------------------------- Nutrition Risk Screening Details Patient Name: Date of Service: [name] Evans, [name] Evans 09/28/2022 8:00 A M Medical Record Number: [id number] Patient Account Number: [id number] Date of Birth/Sex: Treating RN: 1976/07/13 (46 y.o. [name] Evans Riel Hirschman: PA [name] Evans, NO Other Clinician: Referring Dashanna Kinnamon: Treating Genasis Zingale/Extender: [name] Evans in Treatment: 0 Height (in): Weight (lbs): Body Mass Index (BMI): Nutrition Risk Screening Items Score Screening NUTRITION RISK SCREEN: I have an illness or condition that made me change the kind and/or amount of food I eat 0 No I eat fewer than two meals per day 3 Yes I eat few fruits and vegetables, or milk products 0 No I have three or more drinks of beer, liquor or wine almost every day 0 No I have tooth or mouth problems that make it hard for me to eat 0 No I don't always have enough money to buy the food I need 0 No I eat alone most of the time 0 No I take three or more different prescribed or over-the-counter drugs a day 0 No Without wanting to, I have lost or gained 10 pounds in the last six months 2 Yes I am not always physically able to shop, cook and/or feed myself 0 No Nutrition Protocols Good Risk Protocol Moderate Risk Protocol High Risk Proctocol Risk Level: Moderate Risk Score: 5 Electronic Signature(s) Signed: 09/30/2022 8:21:58 AM By: Redmond Pulling RN, BSN Entered By: Redmond Pulling on 09/28/2022 08:31:49

## 2022-10-05 ENCOUNTER — Encounter (HOSPITAL_BASED_OUTPATIENT_CLINIC_OR_DEPARTMENT_OTHER): Payer: BC Managed Care – PPO | Admitting: Internal Medicine

## 2022-10-05 DIAGNOSIS — I87312 Chronic venous hypertension (idiopathic) with ulcer of left lower extremity: Secondary | ICD-10-CM | POA: Diagnosis not present

## 2022-10-05 DIAGNOSIS — L97822 Non-pressure chronic ulcer of other part of left lower leg with fat layer exposed: Secondary | ICD-10-CM | POA: Diagnosis not present

## 2022-10-05 NOTE — Progress Notes (Signed)
[name] MiyamotoITTS, [name] Evans ([id number]) [id number] Page 1 of 11 Visit Report for 10/05/2022 Arrival Information Details Patient Name: Date of Service: [name] Evans 10/05/2022 8:00 A M Medical Record Number: [id number] Patient Account Number: [id number] Date of Birth/Sex: Treating RN: 03/16/1976 (46 y.o. [name] Evans, Carrie Primary Care Tyan Lasure: PA Zenovia JordanIENT, NO Other Clinician: Referring Kasheem Toner: Treating Stormee Duda/Extender: Tilda FrancoHoffman, Jessica Weeks in Treatment: 1 Visit Information History Since Last Visit Added or deleted any medications: No Patient Arrived: Ambulatory Any new allergies or adverse reactions: No Arrival Time: 08:15 Had a fall or experienced change in No Accompanied By: Friend activities of daily living that may affect Transfer Assistance: None risk of falls: Patient Identification Verified: Yes Signs or symptoms of abuse/neglect since last visito No Secondary Verification Process Completed: Yes Hospitalized since last visit: No Patient Requires Transmission-Based Precautions: No Implantable device outside of the clinic excluding No Patient Has Alerts: No cellular tissue based products placed in the center since last visit: Has Dressing in Place as Prescribed: Yes Has Compression in Place as Prescribed: Yes Pain Present Now: Yes Electronic Signature(s) Signed: 10/05/2022 3:46:43 PM By: Redmond PullingPalmer, Carrie RN, BSN Entered By: Redmond PullingPalmer, Carrie on 10/05/2022 08:19:15 -------------------------------------------------------------------------------- Clinic Level of Care Assessment Details Patient Name: Date of Service: [name] Evans 10/05/2022 8:00 A M Medical Record Number: [id number] Patient Account Number: [id number] Date of Birth/Sex: Treating RN: 09/29/1976 (46 y.o. [name] Evans, [name] Evans Primary Care Legacy Lacivita: PA TIENT, NO Other Clinician: Referring Foster Frericks: Treating Azucena Dart/Extender: Tilda FrancoHoffman, Jessica Weeks in Treatment: 1 Clinic Level of Care  Assessment Items TOOL 4 Quantity Score X- 1 0 Use when only an EandM is performed on FOLLOW-UP visit ASSESSMENTS - Nursing Assessment / Reassessment X- 1 10 Reassessment of Co-morbidities (includes updates in patient status) X- 1 5 Reassessment of Adherence to Treatment Plan ASSESSMENTS - Wound and Skin A ssessment / Reassessment []  - 0 Simple Wound Assessment / Reassessment - one wound X- 2 5 Complex Wound Assessment / Reassessment - multiple wounds []  - 0 Dermatologic / Skin Assessment (not related to wound area) ASSESSMENTS - Focused Assessment X- 1 5 Circumferential Edema Measurements - multi extremities []  - 0 Nutritional Assessment / Counseling / Intervention [name] Evans ([id number]) [id number]) [id number] Page 2 of 11 []  - 0 Lower Extremity Assessment (monofilament, tuning fork, pulses) []  - 0 Peripheral Arterial Disease Assessment (using hand held doppler) ASSESSMENTS - Ostomy and/or Continence Assessment and Care []  - 0 Incontinence Assessment and Management []  - 0 Ostomy Care Assessment and Management (repouching, etc.) PROCESS - Coordination of Care []  - 0 Simple Patient / Family Education for ongoing care X- 1 20 Complex (extensive) Patient / Family Education for ongoing care X- 1 10 Staff obtains ChiropractorConsents, Records, T Results / Process Orders est []  - 0 Staff telephones HHA, Nursing Homes / Clarify orders / etc []  - 0 Routine Transfer to another Facility (non-emergent condition) []  - 0 Routine Hospital Admission (non-emergent condition) []  - 0 New Admissions / Manufacturing engineernsurance Authorizations / Ordering NPWT Apligraf, etc. , []  - 0 Emergency Hospital Admission (emergent condition) []  - 0 Simple Discharge Coordination X- 1 15 Complex (extensive) Discharge Coordination PROCESS - Special Needs []  - 0 Pediatric / Minor Patient Management []  - 0 Isolation Patient Management []  - 0 Hearing / Language / Visual special needs []  -  0 Assessment of Community assistance (transportation, D/C planning, etc.) []  - 0 Additional assistance / Altered mentation []  - 0 Support Surface(s) Assessment (bed, cushion, seat, etc.) INTERVENTIONS - Wound Cleansing / Measurement []  -  0 Simple Wound Cleansing - one wound X- 2 5 Complex Wound Cleansing - multiple wounds X- 1 5 Wound Imaging (photographs - any number of wounds) []  - 0 Wound Tracing (instead of photographs) []  - 0 Simple Wound Measurement - one wound X- 2 5 Complex Wound Measurement - multiple wounds INTERVENTIONS - Wound Dressings []  - 0 Small Wound Dressing one or multiple wounds X- 2 15 Medium Wound Dressing one or multiple wounds []  - 0 Large Wound Dressing one or multiple wounds X- 1 5 Application of Medications - topical []  - 0 Application of Medications - injection INTERVENTIONS - Miscellaneous []  - 0 External ear exam []  - 0 Specimen Collection (cultures, biopsies, blood, body fluids, etc.) []  - 0 Specimen(s) / Culture(s) sent or taken to Lab for analysis []  - 0 Patient Transfer (multiple staff / / Similar devices) []  - 0 Simple Staple / Suture removal (25 or less) []  - 0 Complex Staple / Suture removal (26 or more) []  - 0 Hypo / Hyperglycemic Management (close monitor of Blood Glucose) Evans, [name] ( ) .pdf Page 3 of 11 []  - 0 Ankle / Brachial Index (ABI) - do not check if billed separately X- 1 5 Vital Signs Has the patient been seen at the hospital within the last three years: Yes Total Score: 140 Level Of Care: New/Established - Level 4 Electronic Signature(s) Signed: 10/05/2022 4:45:06 PM By: RN Entered By: on 10/05/2022 08:51:18 -------------------------------------------------------------------------------- Encounter Discharge Information Details Patient Name: Date of Service: [name] Evans, [name] Evans 10/05/2022 8:00 A M Medical Record  Number: Nurse, adult Patient Account Number: Date of Birth/Sex: Treating RN: October 16, 1976 (46 y.o. [id number], [name] Evans Primary Care Pawel Soules: [place] [id number], NO Other Clinician: Referring Chanequa Spees: Treating Allura Doepke/Extender: in Treatment: 1 Encounter Discharge Information Items Discharge Condition: Stable Ambulatory Status: Ambulatory Discharge Destination: Home Transportation: Private Auto Accompanied By: husband Schedule Follow-up Appointment: Yes Clinical Summary of Care: Patient Declined Electronic Signature(s) Signed: 10/05/2022 4:45:06 PM By: Fonnie Mu RN Entered By: Fonnie Mu on 10/05/2022 08:51:57 -------------------------------------------------------------------------------- Lower Extremity Assessment Details Patient Name: Date of Service: [name] Evans, [name] Evans 10/05/2022 8:00 A M Medical Record Number: [id number] Patient Account Number: [id number] Date of Birth/Sex: Treating RN: 10/18/1976 (46 y.o. [name] Evans Primary Care Asencion Guisinger: PA Zenovia Jordan, NO Other Clinician: Referring Steward Sames: Treating Monzerrath Mcburney/Extender: Tilda Franco in Treatment: 1 Edema Assessment Assessed: [Left: No] [Right: No] Edema: [Left: Ye] [Right: s] Calf Left: Right: Point of Measurement: 28 cm From Medial Instep 33 cm Ankle Left: Right: Point of Measurement: 10 cm From Medial Instep 22.5 cm Vascular Assessment Left: [[id number] Page 4 of 11Right:] Pulses: Dorsalis Pedis Palpable: [[id number] Page 4 of 11Yes] Electronic Signature(s) Signed: 10/05/2022 3:46:43 PM By: 10/07/2022 RN, BSN Entered By: [name] Evans on 10/05/2022 08:29:06 -------------------------------------------------------------------------------- Multi Wound Chart Details Patient Name: Date of Service: [name] Evans, [name] Evans 10/05/2022 8:00 A M Medical Record Number: 06/23/1976 Patient Account Number: [id number] Date of  Birth/Sex: Treating RN: [birth year] (46 y.o. F) Primary Care Randee Upchurch: PA TIENT, NO Other Clinician: Referring Keiyon Plack: Treating Chrystine Frogge/Extender: Tilda Franco in Treatment: 1 Vital Signs Height(in): 65 Pulse(bpm): 76 Weight(lbs): 115 Blood Pressure(mmHg): 122/85 Body Mass Index(BMI): 19.1 Temperature(F): 97.8 Respiratory Rate(breaths/min): 18 [1:Photos:] [N/A:N/A] Left, Medial Lower Leg Left, Dorsal Foot N/A Wound Location: Laceration Gradually Appeared N/A Wounding Event: Venous Leg Ulcer Venous Leg Ulcer N/A Primary Etiology: Peripheral Venous Disease Peripheral Venous Disease N/A Comorbid History: 04/16/2022 07/17/2022 N/A Date Acquired: 1 1 N/A  Weeks of Treatment: Open Open N/A Wound Status: No No N/A Wound Recurrence: 5.1x2.5x0.2 1.8x0.9x0.1 N/A Measurements L x W x D (cm) 10.014 1.272 N/A A (cm) : rea 2.003 0.127 N/A Volume (cm) : 54.50% 8.00% N/A % Reduction in A rea: 54.50% 8.00% N/A % Reduction in Volume: Full Thickness Without Exposed Full Thickness Without Exposed N/A Classification: Support Structures Support Structures Medium Medium N/A Exudate A mount: Serosanguineous Serosanguineous N/A Exudate Type: red, brown red, brown N/A Exudate Color: Large (67-100%) Medium (34-66%) N/A Granulation Amount: Red, Hyper-granulation Red N/A Granulation Quality: Small (1-33%) Medium (34-66%) N/A Necrotic Amount: Eschar, Adherent Slough Eschar, Adherent Slough N/A Necrotic Tissue: Fat Layer (Subcutaneous Tissue): Yes Fat Layer (Subcutaneous Tissue): Yes N/A Exposed Structures: Fascia: No Fascia: No Tendon: No Tendon: No Muscle: No Muscle: No Joint: No Joint: No Bone: No Bone: No Medium (34-66%) Small (1-33%) N/A Epithelialization: No Abnormalities Noted N/A Periwound Skin Moisture: Hemosiderin Staining: Yes Hemosiderin Staining: Yes N/A Periwound Skin Color: Erythema: No N/A No Abnormality N/A Temperature: [name] Evans, [name] Evans  ([id number]) [id number] Page 5 of 11 Treatment Notes Wound #1 (Lower Leg) Wound Laterality: Left, Medial Cleanser Soap and Water Discharge Instruction: May shower and wash wound with dial antibacterial soap and water prior to dressing change. Wound Cleanser Discharge Instruction: Cleanse the wound with wound cleanser prior to applying a clean dressing using gauze sponges, not tissue or cotton balls. Peri-Wound Care Topical Primary Dressing Hydrofera Blue Ready Foam, 4x5 in Discharge Instruction: Apply to wound bed as instructed MediHoney Gel, tube 1.5 (oz) Discharge Instruction: Apply to wound bed as instructed Secondary Dressing ABD Pad, 5x9 Discharge Instruction: Apply over primary dressing as directed. Woven Gauze Sponge, Non-Sterile 4x4 in Discharge Instruction: Apply over primary dressing as directed. Secured With [employer], 4.5x3.1 (in/yd) Discharge Instruction: Secure with Kerlix as directed. 71M Medipore H Soft Cloth Surgical T ape, 4 x 10 (in/yd) Discharge Instruction: Secure with tape as directed. ace wrap Discharge Instruction: apply in morning and remove at night Compression Wrap Compression Stockings Add-Ons Wound #2 (Foot) Wound Laterality: Dorsal, Left Cleanser Soap and Water Discharge Instruction: May shower and wash wound with dial antibacterial soap and water prior to dressing change. Wound Cleanser Discharge Instruction: Cleanse the wound with wound cleanser prior to applying a clean dressing using gauze sponges, not tissue or cotton balls. Peri-Wound Care Topical Primary Dressing Hydrofera Blue Ready Foam, 4x5 in Discharge Instruction: Apply to wound bed as instructed MediHoney Gel, tube 1.5 (oz) Discharge Instruction: Apply to wound bed as instructed Secondary Dressing ABD Pad, 5x9 Discharge Instruction: Apply over primary dressing as directed. Woven Gauze Sponge, Non-Sterile 4x4 in Discharge Instruction: Apply  over primary dressing as directed. Secured With [employer], 4.5x3.1 (in/yd) Discharge Instruction: Secure with Kerlix as directed. 71M Medipore H Soft Cloth Surgical T ape, 4 x 10 (in/yd) Discharge Instruction: Secure with tape as directed. ace wrap Discharge Instruction: apply in morning and remove at night [name] Evans, [name] Evans ([id number]) [phone].pdf Page 6 of 11 Compression Wrap Compression Stockings Add-Ons Electronic Signature(s) Signed: 10/05/2022 11:34:26 AM By: Geralyn Corwin DO Entered By: Geralyn Corwin on 10/05/2022 08:53:42 -------------------------------------------------------------------------------- Multi-Disciplinary Care Plan Details Patient Name: Date of Service: LAVEAH, GLOSTER 10/05/2022 8:00 A M Medical Record Number: [id number] Patient Account Number: [id number] Date of Birth/Sex: Treating RN: 08/17/1976 (46 y.o. [name] Evans, [name] Evans Primary Care Overton Boggus: PA Zenovia Jordan, NO Other Clinician: Referring Jianna Drabik: Treating Marlane Hirschmann/Extender: Tilda Franco in Treatment: 1 Active Inactive Wound/Skin Impairment Nursing Diagnoses: Impaired tissue integrity Knowledge deficit related  to ulceration/compromised skin integrity Goals: Patient will have a decrease in wound volume by X% from date: (specify in notes) Date Initiated: 09/16/2022 Target Resolution Date: 10/23/2022 Goal Status: Active Patient/caregiver will verbalize understanding of skin care regimen Date Initiated: 09/16/2022 Target Resolution Date: 10/24/2022 Goal Status: Active Ulcer/skin breakdown will have a volume reduction of 30% by week 4 Date Initiated: 09/16/2022 Target Resolution Date: 10/23/2022 Goal Status: Active Interventions: Assess patient/caregiver ability to obtain necessary supplies Assess patient/caregiver ability to perform ulcer/skin care regimen upon admission and as needed Assess ulceration(s) every visit Notes: Electronic  Signature(s) Signed: 10/05/2022 4:45:06 PM By: Fonnie Mu RN Entered By: Fonnie Mu on 10/05/2022 08:20:20 -------------------------------------------------------------------------------- Pain Assessment Details Patient Name: Date of Service: JUSTA, HATCHELL 10/05/2022 8:00 A M Medical Record Number: [id number] Patient Account Number: [id number] Date of Birth/Sex: Treating RN: [birth year] (46 y.o. [name] Evans Primary Care Vessie Olmsted: PA Zenovia Jordan, [place] Other Clinician: Referring Olanda Downie: Treating Jayveion Stalling/Extender: Daejah, Klebba, [name] Evans ([id number]) [id number] Page 7 of 11 Weeks in Treatment: 1 Active Problems Location of Pain Severity and Description of Pain Patient Has Paino Yes Site Locations Rate the pain. Current Pain Level: 1 Worst Pain Level: 4 Character of Pain Describe the Pain: Burning Pain Management and Medication Current Pain Management: Electronic Signature(s) Signed: 10/05/2022 3:46:43 PM By: Redmond Pulling RN, BSN Entered By: Redmond Pulling on 10/05/2022 08:20:59 -------------------------------------------------------------------------------- Patient/Caregiver Education Details Patient Name: Date of Service: [name] Evans 11/20/2023andnbsp8:00 A M Medical Record Number: [id number] Patient Account Number: [id number] Date of Birth/Gender: Treating RN: 12-Aug-1976 (46 y.o. [name] Evans, [name] Evans Primary Care Physician: PA Zenovia Jordan, NO Other Clinician: Referring Physician: Treating Physician/Extender: Tilda Franco in Treatment: 1 Education Assessment Education Provided To: Patient Education Topics Provided Wound/Skin Impairment: Methods: Explain/Verbal Responses: Reinforcements needed, State content correctly Electronic Signature(s) Signed: 10/05/2022 4:45:06 PM By: Fonnie Mu RN Entered By: Fonnie Mu on 10/05/2022 08:20:33 [name] Evans, [name] Evans ([id number])  [id number] Page 8 of 11 -------------------------------------------------------------------------------- Wound Assessment Details Patient Name: Date of Service: [name] Evans, [name] Evans 10/05/2022 8:00 A M Medical Record Number: [id number] Patient Account Number: [id number] Date of Birth/Sex: Treating RN: 1976/03/23 (46 y.o. [name] Evans Primary Care Milan Clare: PA TIENT, NO Other Clinician: Referring Teran Daughenbaugh: Treating Reghan Thul/Extender: Tilda Franco in Treatment: 1 Wound Status Wound Number: 1 Primary Etiology: Venous Leg Ulcer Wound Location: Left, Medial Lower Leg Wound Status: Open Wounding Event: Laceration Comorbid History: Peripheral Venous Disease Date Acquired: 04/16/2022 Weeks Of Treatment: 1 Clustered Wound: No Photos Wound Measurements Length: (cm) 5.1 Width: (cm) 2.5 Depth: (cm) 0.2 Area: (cm) 10.014 Volume: (cm) 2.003 % Reduction in Area: 54.5% % Reduction in Volume: 54.5% Epithelialization: Medium (34-66%) Tunneling: No Undermining: No Wound Description Classification: Full Thickness Without Exposed Suppor Exudate Amount: Medium Exudate Type: Serosanguineous Exudate Color: red, brown t Structures Foul Odor After Cleansing: No Slough/Fibrino Yes Wound Bed Granulation Amount: Large (67-100%) Exposed Structure Granulation Quality: Red, Hyper-granulation Fascia Exposed: No Necrotic Amount: Small (1-33%) Fat Layer (Subcutaneous Tissue) Exposed: Yes Necrotic Quality: Eschar, Adherent Slough Tendon Exposed: No Muscle Exposed: No Joint Exposed: No Bone Exposed: No Periwound Skin Texture Texture Color No Abnormalities Noted: No No Abnormalities Noted: No Erythema: No Moisture Hemosiderin Staining: Yes No Abnormalities Noted: No Treatment Notes Wound #1 (Lower Leg) Wound Laterality: Left, Medial Cleanser Soap and Water Discharge Instruction: May shower and wash wound with dial antibacterial soap and water prior to  dressing change. [name] Evans, [name] Evans ([id number]) [id number] Page 9 of 11 Wound Cleanser Discharge Instruction: Cleanse the wound with wound  cleanser prior to applying a clean dressing using gauze sponges, not tissue or cotton balls. Peri-Wound Care Topical Primary Dressing Hydrofera Blue Ready Foam, 4x5 in Discharge Instruction: Apply to wound bed as instructed MediHoney Gel, tube 1.5 (oz) Discharge Instruction: Apply to wound bed as instructed Secondary Dressing ABD Pad, 5x9 Discharge Instruction: Apply over primary dressing as directed. Woven Gauze Sponge, Non-Sterile 4x4 in Discharge Instruction: Apply over primary dressing as directed. Secured With [employer], 4.5x3.1 (in/yd) Discharge Instruction: Secure with Kerlix as directed. 71M Medipore H Soft Cloth Surgical T ape, 4 x 10 (in/yd) Discharge Instruction: Secure with tape as directed. ace wrap Discharge Instruction: apply in morning and remove at night Compression Wrap Compression Stockings Add-Ons Electronic Signature(s) Signed: 10/05/2022 3:46:43 PM By: Redmond Pulling RN, BSN Entered By: Redmond Pulling on 10/05/2022 08:32:01 -------------------------------------------------------------------------------- Wound Assessment Details Patient Name: Date of Service: [name] Evans, [name] Evans 10/05/2022 8:00 A M Medical Record Number: [id number] Patient Account Number: [id number] Date of Birth/Sex: Treating RN: Apr 26, 1976 (46 y.o. [name] Evans Primary Care Kaleya Douse: PA Zenovia Jordan, [place] Other Clinician: Referring Carlissa Pesola: Treating Jashad Depaula/Extender: Tilda Franco in Treatment: 1 Wound Status Wound Number: 2 Primary Etiology: Venous Leg Ulcer Wound Location: Left, Dorsal Foot Wound Status: Open Wounding Event: Gradually Appeared Comorbid History: Peripheral Venous Disease Date Acquired: 07/17/2022 Weeks Of Treatment: 1 Clustered Wound: No Photos JONIYAH, MALLINGER ([id number])  [id number] Page 10 of 11 Wound Measurements Length: (cm) 1.8 Width: (cm) 0.9 Depth: (cm) 0.1 Area: (cm) 1.272 Volume: (cm) 0.127 % Reduction in Area: 8% % Reduction in Volume: 8% Epithelialization: Small (1-33%) Tunneling: No Undermining: No Wound Description Classification: Full Thickness Without Exposed Support Structures Exudate Amount: Medium Exudate Type: Serosanguineous Exudate Color: red, brown Foul Odor After Cleansing: No Slough/Fibrino Yes Wound Bed Granulation Amount: Medium (34-66%) Exposed Structure Granulation Quality: Red Fascia Exposed: No Necrotic Amount: Medium (34-66%) Fat Layer (Subcutaneous Tissue) Exposed: Yes Necrotic Quality: Eschar, Adherent Slough Tendon Exposed: No Muscle Exposed: No Joint Exposed: No Bone Exposed: No Periwound Skin Texture Texture Color No Abnormalities Noted: No No Abnormalities Noted: No Hemosiderin Staining: Yes Moisture No Abnormalities Noted: Yes Temperature / Pain Temperature: No Abnormality Treatment Notes Wound #2 (Foot) Wound Laterality: Dorsal, Left Cleanser Soap and Water Discharge Instruction: May shower and wash wound with dial antibacterial soap and water prior to dressing change. Wound Cleanser Discharge Instruction: Cleanse the wound with wound cleanser prior to applying a clean dressing using gauze sponges, not tissue or cotton balls. Peri-Wound Care Topical Primary Dressing Hydrofera Blue Ready Foam, 4x5 in Discharge Instruction: Apply to wound bed as instructed MediHoney Gel, tube 1.5 (oz) Discharge Instruction: Apply to wound bed as instructed Secondary Dressing ABD Pad, 5x9 Discharge Instruction: Apply over primary dressing as directed. Woven Gauze Sponge, Non-Sterile 4x4 in Discharge Instruction: Apply over primary dressing as directed. Secured With [employer], 4.5x3.1 (in/yd) Discharge Instruction: Secure with Kerlix as directed. 71M Medipore H Soft  Cloth Surgical Tape, 4 x 10 (in/yd) Rosencrans, Alvita ([id number]) [id number].pdf Page 11 of 11 Discharge Instruction: Secure with tape as directed. ace wrap Discharge Instruction: apply in morning and remove at night Compression Wrap Compression Stockings Add-Ons Electronic Signature(s) Signed: 10/05/2022 3:46:43 PM By: Redmond Pulling RN, BSN Entered By: Redmond Pulling on 10/05/2022 08:31:40 -------------------------------------------------------------------------------- Vitals Details Patient Name: Date of Service: [name] Evans 10/05/2022 8:00 A M Medical Record Number: [id number] Patient Account Number: [id number] Date of Birth/Sex: Treating RN: 30-Oct-1976 (46 y.o. [name] Evans Primary Care Carnelius Hammitt: PA TIENT, NO Other  Clinician: Referring Kalim Kissel: Treating Ranson Belluomini/Extender: Tilda Franco in Treatment: 1 Vital Signs Time Taken: 08:19 Temperature (F): 97.8 Height (in): 65 Pulse (bpm): 76 Weight (lbs): 115 Respiratory Rate (breaths/min): 18 Body Mass Index (BMI): 19.1 Blood Pressure (mmHg): 122/85 Reference Range: 80 - 120 mg / dl Electronic Signature(s) Signed: 10/05/2022 3:46:43 PM By: Redmond Pulling RN, BSN Entered By: Redmond Pulling on 10/05/2022 08:20:14

## 2022-10-05 NOTE — Progress Notes (Signed)
[name] MiyamotoITTS, Stuart ([id number]) [id number] Page 1 of 7 Visit Report for 10/05/2022 Chief Complaint Document Details Patient Name: Date of Service: [name] PettiesITTS, JA CQ UELINE 10/05/2022 8:00 A M Medical Record Number: [id number] Patient Account Number: [id number] Date of Birth/Sex: Treating RN: 04/15/1976 (46 y.o. F) Primary Care Provider: PA [name] JordanIENT, NO Other Clinician: Referring Provider: Treating Provider/Extender: [name] Evans, [name] Evans: 1 Information Obtained from: Patient Chief Complaint 09/28/2022; left lower extremity wound Electronic Signature(s) Signed: 10/05/2022 11:34:26 AM By: [name] CorwinHoffman, [name] Heide DO Entered By: [name] CorwinHoffman, [name] Evans on 10/05/2022 08:53:49 -------------------------------------------------------------------------------- HPI Details Patient Name: Date of Service: [name] PettiesITTS, JA CQ UELINE 10/05/2022 8:00 A M Medical Record Number: [id number] Patient Account Number: [id number] Date of Birth/Sex: Treating RN: 01/03/1976 (46 y.o. F) Primary Care Provider: PA [name] JordanIENT, NO Other Clinician: Referring Provider: Treating Provider/Extender: [name] Evans, [name] Evans Weeks in Evans: 1 History of Present Illness HPI Description: Admission 09/28/2022 Ms. 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 She states that her mother had a history of venous insufficiency with wounds as well. She has been using bacitracin to the wound bed. She has chronic pain to the area. She was seen in the ED for this issue on 10/21 and 10/24. She completed a 10-day course of doxycycline and Keflex for this issue. She states she works at [employer] and is on her feet for over 12 hours a day. She is not using compression therapy. She currently denies systemic signs of infection. 11/20; patient presents for  follow-up. She has been using Medihoney and Hydrofera Blue to the wound bed. She reports improvement in pain however still has tenderness to the wound sites. She states that she has been elevating her leg and using an Ace bandage. She has been out of work for the past week. She had a PCR culture done at last clinic visit that grew coag negative staph and Candida parapsilosis. Keystone antibiotic ointment has been ordered. Electronic Signature(s) Signed: 10/05/2022 11:34:26 AM By: [name] CorwinHoffman, [name] Moone DO Entered By: [name] CorwinHoffman, [name] Evans on 10/05/2022 09:01:16 -------------------------------------------------------------------------------- Physical Exam Details Patient Name: Date of Service: [name] PettiesITTS, JA CQ UELINE 10/05/2022 8:00 A Retia PasseM Evans, [name] ([id number]) [id number] Page 2 of 7 Medical Record Number: [id number] Patient Account Number: [id number] Date of Birth/Sex: Treating RN: 06/01/1976 (46 y.o. F) Primary Care Provider: PA [name] JordanIENT, NO Other Clinician: Referring Provider: Treating Provider/Extender: [name] Evans, [name] Evans Weeks in Evans: 1 Constitutional respirations regular, non-labored and within target range for patient.. Cardiovascular 2+ dorsalis pedis/posterior tibialis pulses. Psychiatric pleasant and cooperative. Notes Left lower extremity: 2+ pitting edema to the knee. T the distal medial aspect there is a large open wound with nonviable tissue and granulation tissue. No o increased warmth, erythema or purulent drainage. There is a small open wound to the left dorsal foot also with granulation tissue, Overall areas appear unhealthy. Electronic Signature(s) Signed: 10/05/2022 11:34:26 AM By: [name] CorwinHoffman, [name] Sheehy DO Entered By: [name] CorwinHoffman, [name] Evans on 10/05/2022 08:58:31 -------------------------------------------------------------------------------- Physician Orders Details Patient Name: Date of Service: [name] PettiesITTS, JA CQ UELINE 10/05/2022 8:00 A M Medical Record  Number: [id number] Patient Account Number: [id number] Date of Birth/Sex: Treating RN: 04/29/1976 (46 y.o. Ardis RowanF) Evans, [name] Primary Care Provider: PA [name] JordanIENT, NO Other Clinician: Referring Provider: Treating Provider/Extender: [name] Evans, [name] Evans Weeks in Evans: 1 Verbal / Phone Orders: No Diagnosis Coding Follow-up Appointments ppointment in 1 week. - w/ Dr. Mikey BussingHOffman Return A  Anesthetic (In clinic) Topical Lidocaine 5% applied to wound bed Bathing/ Shower/ Hygiene May shower with protection but do not get wound dressing(s) wet. Edema Control - Lymphedema / SCD / Other Elevate legs to the level of the heart or above for 30 minutes daily and/or when sitting, a frequency of: Avoid standing for long periods of time. Wound Evans Wound #1 - Lower Leg Wound Laterality: Left, Medial Cleanser: Soap and Water 1 x Per Day/15 Days Discharge Instructions: May shower and wash wound with dial antibacterial soap and water prior to dressing change. Cleanser: Wound Cleanser (Generic) 1 x Per Day/15 Days Discharge Instructions: Cleanse the wound with wound cleanser prior to applying a clean dressing using gauze sponges, not tissue or cotton balls. Prim Dressing: Hydrofera Blue Ready Foam, 4x5 in (Generic) 1 x Per Day/15 Days ary Discharge Instructions: Apply to wound bed as instructed Prim Dressing: MediHoney Gel, tube 1.5 (oz) 1 x Per Day/15 Days ary Discharge Instructions: Apply to wound bed as instructed Secondary Dressing: ABD Pad, 5x9 (Generic) 1 x Per Day/15 Days Discharge Instructions: Apply over primary dressing as directed. Secondary Dressing: Woven Gauze Sponge, Non-Sterile 4x4 in (Generic) 1 x Per Day/15 Days MILANNI, Evans ([id number]) [phone].pdf Page 3 of 7 Discharge Instructions: Apply over primary dressing as directed. Secured With: [employer], 4.5x3.1 (in/yd) (Generic) 1 x Per Day/15 Days Discharge Instructions: Secure with Kerlix as  directed. Secured With: 19M Medipore H Soft Cloth Surgical T ape, 4 x 10 (in/yd) (Generic) 1 x Per Day/15 Days Discharge Instructions: Secure with tape as directed. Secured With: ace wrap 1 x Per Day/15 Days Discharge Instructions: apply in morning and remove at night Wound #2 - Foot Wound Laterality: Dorsal, Left Cleanser: Soap and Water 1 x Per Day/15 Days Discharge Instructions: May shower and wash wound with dial antibacterial soap and water prior to dressing change. Cleanser: Wound Cleanser (Generic) 1 x Per Day/15 Days Discharge Instructions: Cleanse the wound with wound cleanser prior to applying a clean dressing using gauze sponges, not tissue or cotton balls. Prim Dressing: Hydrofera Blue Ready Foam, 4x5 in (Generic) 1 x Per Day/15 Days ary Discharge Instructions: Apply to wound bed as instructed Prim Dressing: MediHoney Gel, tube 1.5 (oz) 1 x Per Day/15 Days ary Discharge Instructions: Apply to wound bed as instructed Secondary Dressing: ABD Pad, 5x9 (Generic) 1 x Per Day/15 Days Discharge Instructions: Apply over primary dressing as directed. Secondary Dressing: Woven Gauze Sponge, Non-Sterile 4x4 in (Generic) 1 x Per Day/15 Days Discharge Instructions: Apply over primary dressing as directed. Secured With: [employer], 4.5x3.1 (in/yd) (Generic) 1 x Per Day/15 Days Discharge Instructions: Secure with Kerlix as directed. Secured With: 19M Medipore H Soft Cloth Surgical T ape, 4 x 10 (in/yd) (Generic) 1 x Per Day/15 Days Discharge Instructions: Secure with tape as directed. Secured With: ace wrap 1 x Per Day/15 Days Discharge Instructions: apply in morning and remove at night Electronic Signature(s) Signed: 10/05/2022 11:34:26 AM By: [name] Corwin DO Entered By: [name] Evans on 10/05/2022 08:58:51 -------------------------------------------------------------------------------- Problem List Details Patient Name: Date of Service: [name] Evans, [name] Evans 10/05/2022 8:00  A M Medical Record Number: [id number] Patient Account Number: [id number] Date of Birth/Sex: Treating RN: 01-22-1976 (46 y.o. 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/28/2022 No Yes exposed I87.312  Chronic venous hypertension (idiopathic) with ulcer of left lower extremity 09/28/2022 No Yes [name] Evans, [name] Evans ([id number]) [phone].pdf Page 4 of 7 Inactive Problems Resolved Problems Electronic Signature(s) Signed: 10/05/2022 11:34:26 AM By: [name] Corwin DO Entered By: [name] Evans on 10/05/2022 08:53:25 -------------------------------------------------------------------------------- Progress Note Details Patient Name: Date of Service: [name] Evans, [name] Evans 10/05/2022 8:00 A M Medical Record Number: [id number] Patient Account Number: [id number] Date of Birth/Sex: Treating RN: 03-11-1976 (46 y.o. F) Primary Care Provider: PA [name] Evans, NO Other Clinician: Referring Provider: Treating Provider/Extender: [name] Franco in Evans: 1 Subjective Chief Complaint Information obtained from Patient 09/28/2022; left lower extremity wound History of Present Illness (HPI) Admission 09/28/2022 Ms. Khyra Viscuso is a 46 year old female with a past medical history of venous insufficiency and DVT to the left leg that presents to the clinic for a 5- month history of nonhealing ulcer to the left lower extremity and dorsal foot. She states that her mother had a history of venous insufficiency with wounds as well. She has been using bacitracin to the wound bed. She has chronic pain to the area. She was seen in the ED for this issue on 10/21 and 10/24. She completed a 10-day course of doxycycline and Keflex  for this issue. She states she works at [employer] and is on her feet for over 12 hours a day. She is not using compression therapy. She currently denies systemic signs of infection. 11/20; patient presents for follow-up. She has been using Medihoney and Hydrofera Blue to the wound bed. She reports improvement in pain however still has tenderness to the wound sites. She states that she has been elevating her leg and using an Ace bandage. She has been out of work for the past week. She had a PCR culture done at last clinic visit that grew coag negative staph and Candida parapsilosis. Keystone antibiotic ointment has been ordered. Patient History Information obtained from Patient, Chart. Family History Unknown History, Cancer - Father, Hypertension - Mother, Lung Disease - Father, Thyroid Problems - Mother,Siblings, No family history of Tuberculosis. Social History Current every day smoker - e-ciggs, Alcohol Use - Never, Drug Use - No History, Caffeine Use - Rarely. Medical History Cardiovascular Patient has history of Peripheral Venous Disease Hospitalization/Surgery History - IVC filter. - tubal ligation. Medical A Surgical History Notes nd Hematologic/Lymphatic DVT Musculoskeletal scoliosis Objective Constitutional [name] Evans, [name] Evans ([id number]) [id number] Page 5 of 7 respirations regular, non-labored and within target range for patient.. Vitals Time Taken: 8:19 AM, Height: 65 in, Weight: 115 lbs, BMI: 19.1, Temperature: 97.8 F, Pulse: 76 bpm, Respiratory Rate: 18 breaths/min, Blood Pressure: 122/85 mmHg. Cardiovascular 2+ dorsalis pedis/posterior tibialis pulses. Psychiatric pleasant and cooperative. General Notes: Left lower extremity: 2+ pitting edema to the knee. T the distal medial aspect there is a large open wound with nonviable tissue and granulation o tissue. No increased warmth, erythema or purulent drainage. There is a small open wound to the left  dorsal foot also with granulation tissue, Overall areas appear unhealthy. Integumentary (Hair, Skin) Wound #1 status is Open. Original cause of wound was Laceration. The date acquired was: 04/16/2022. The wound has been in Evans 1 weeks. The wound is located on the Left,Medial Lower Leg. The wound measures 5.1cm length x 2.5cm width x 0.2cm depth; 10.014cm^2 area and 2.003cm^3 volume. There is Fat Layer (Subcutaneous Tissue) exposed. There is no tunneling or undermining noted. There is a medium amount of serosanguineous drainage noted. There is large (67-100%) red, hyper - granulation within the wound bed. There is  a small (1-33%) amount of necrotic tissue within the wound bed including Eschar and Adherent Slough. The periwound skin appearance exhibited: Hemosiderin Staining. The periwound skin appearance did not exhibit: Erythema. Wound #2 status is Open. Original cause of wound was Gradually Appeared. The date acquired was: 07/17/2022. The wound has been in Evans 1 weeks. The wound is located on the Left,Dorsal Foot. The wound measures 1.8cm length x 0.9cm width x 0.1cm depth; 1.272cm^2 area and 0.127cm^3 volume. There is Fat Layer (Subcutaneous Tissue) exposed. There is no tunneling or undermining noted. There is a medium amount of serosanguineous drainage noted. There is medium (34-66%) red granulation within the wound bed. There is a medium (34-66%) amount of necrotic tissue within the wound bed including Eschar and Adherent Slough. The periwound skin appearance had no abnormalities noted for moisture. The periwound skin appearance exhibited: Hemosiderin Staining. Periwound temperature was noted as No Abnormality. Assessment Active Problems ICD-10 Non-pressure chronic ulcer of other part of left lower leg with fat layer exposed Chronic venous hypertension (idiopathic) with ulcer of left lower extremity Patient's wounds have shown improvement in size and appearance since last clinic visit.  I recommended continuing with Hydrofera Blue and Medihoney for now. Keystone antibiotic ointment has been ordered and she should receive this in the next week. She will ultimately need compression therapy. However due to pain she cannot tolerate anything above an Ace wrap for now. I recommended she stay out of work for the next 2 weeks since she states she is on her feet for 16 hours a day. Follow-up in 1 week. Plan Follow-up Appointments: Return Appointment in 1 week. - w/ Dr. Mikey Evans Anesthetic: (In clinic) Topical Lidocaine 5% applied to wound bed Bathing/ Shower/ Hygiene: May shower with protection but do not get wound dressing(s) wet. Edema Control - Lymphedema / SCD / Other: Elevate legs to the level of the heart or above for 30 minutes daily and/or when sitting, a frequency of: Avoid standing for long periods of time. WOUND #1: - Lower Leg Wound Laterality: Left, Medial Cleanser: Soap and Water 1 x Per Day/15 Days Discharge Instructions: May shower and wash wound with dial antibacterial soap and water prior to dressing change. Cleanser: Wound Cleanser (Generic) 1 x Per Day/15 Days Discharge Instructions: Cleanse the wound with wound cleanser prior to applying a clean dressing using gauze sponges, not tissue or cotton balls. Prim Dressing: Hydrofera Blue Ready Foam, 4x5 in (Generic) 1 x Per Day/15 Days ary Discharge Instructions: Apply to wound bed as instructed Prim Dressing: MediHoney Gel, tube 1.5 (oz) 1 x Per Day/15 Days ary Discharge Instructions: Apply to wound bed as instructed Secondary Dressing: ABD Pad, 5x9 (Generic) 1 x Per Day/15 Days Discharge Instructions: Apply over primary dressing as directed. Secondary Dressing: Woven Gauze Sponge, Non-Sterile 4x4 in (Generic) 1 x Per Day/15 Days Discharge Instructions: Apply over primary dressing as directed. Secured With: [employer], 4.5x3.1 (in/yd) (Generic) 1 x Per Day/15 Days Discharge Instructions: Secure with  Kerlix as directed. Secured With: 75M Medipore H Soft Cloth Surgical T ape, 4 x 10 (in/yd) (Generic) 1 x Per Day/15 Days Discharge Instructions: Secure with tape as directed. Secured With: ace wrap 1 x Per Day/15 Days Discharge Instructions: apply in morning and remove at night WOUND #2: - Foot Wound Laterality: Dorsal, Left Cleanser: Soap and Water 1 x Per Day/15 Days Discharge Instructions: May shower and wash wound with dial antibacterial soap and water prior to dressing change. Cleanser: Wound Cleanser (Generic) 1 x Per  Day/15 Days Discharge Instructions: Cleanse the wound with wound cleanser prior to applying a clean dressing using gauze sponges, not tissue or cotton balls. Prim Dressing: Hydrofera Blue Ready Foam, 4x5 in (Generic) 1 x Per Day/15 Days ary Discharge Instructions: Apply to wound bed as instructed [name] Evans, [name] Evans ([id number]) [id number] Page 6 of 7 Prim Dressing: MediHoney Gel, tube 1.5 (oz) 1 x Per Day/15 Days ary Discharge Instructions: Apply to wound bed as instructed Secondary Dressing: ABD Pad, 5x9 (Generic) 1 x Per Day/15 Days Discharge Instructions: Apply over primary dressing as directed. Secondary Dressing: Woven Gauze Sponge, Non-Sterile 4x4 in (Generic) 1 x Per Day/15 Days Discharge Instructions: Apply over primary dressing as directed. Secured With: [employer], 4.5x3.1 (in/yd) (Generic) 1 x Per Day/15 Days Discharge Instructions: Secure with Kerlix as directed. Secured With: 102M Medipore H Soft Cloth Surgical T ape, 4 x 10 (in/yd) (Generic) 1 x Per Day/15 Days Discharge Instructions: Secure with tape as directed. Secured With: ace wrap 1 x Per Day/15 Days Discharge Instructions: apply in morning and remove at night 1. Medihoney with Hydrofera Blue 2. Ace wrap 3. Out of work for the next 2 weeks -note written 4. Keystone antibiotic ordered 5. Follow-up in 1 week Electronic Signature(s) Signed: 10/05/2022 11:34:26 AM  By: [name] Corwin DO Entered By: [name] Evans on 10/05/2022 09:03:56 -------------------------------------------------------------------------------- HxROS Details Patient Name: Date of Service: [name] Evans, [name] Evans 10/05/2022 8:00 A M Medical Record Number: [id number] Patient Account Number: [id number] Date of Birth/Sex: Treating RN: 29-Oct-1976 (46 y.o. F) Primary Care Provider: PA [name] Evans, NO Other Clinician: Referring Provider: Treating Provider/Extender: [name] Franco in Evans: 1 Information Obtained From Patient Chart Hematologic/Lymphatic Medical History: Past Medical History Notes: DVT Cardiovascular Medical History: Positive for: Peripheral Venous Disease Musculoskeletal Medical History: Past Medical History Notes: scoliosis Immunizations Pneumococcal Vaccine: Received Pneumococcal Vaccination: No Implantable Devices None Hospitalization / Surgery History Type of Hospitalization/Surgery IVC filter tubal ligation Family and Social History Unknown History: Yes; Cancer: Yes - Father; Hypertension: Yes - Mother; Lung Disease: Yes - Father; Thyroid Problems: Yes - Mother,Siblings; JOBINA, MAITA ([id number]) [phone].pdf Page 7 of 7 Tuberculosis: No; Current every day smoker - e-ciggs; Alcohol Use: Never; Drug Use: No History; Caffeine Use: Rarely; Financial Concerns: No; Food, Clothing or Shelter Needs: No; Support System Lacking: No; Transportation Concerns: No Electronic Signature(s) Signed: 10/05/2022 11:34:26 AM By: [name] Corwin DO Entered By: [name] Evans on 10/05/2022 08:57:13 -------------------------------------------------------------------------------- SuperBill Details Patient Name: Date of Service: [name] Evans, [name] Evans 10/05/2022 Medical Record Number: [id number] Patient Account Number: [id number] Date of Birth/Sex: Treating RN: 02-Mar-1976 (46 y.o. [name], [name] Primary Care Provider: PA [name] Evans, NO  Other Clinician: Referring Provider: Treating Provider/Extender: [name] Franco in Evans: 1 Diagnosis Coding ICD-10 Codes Code Description [phone] Non-pressure chronic ulcer of other part of left lower leg with fat layer exposed I87.312 Chronic venous hypertension (idiopathic) with ulcer of left lower extremity Facility Procedures : CPT4 Code: [id number] Description: 99214 - WOUND CARE VISIT-LEV 4 EST PT Modifier: Quantity: 1 Physician Procedures : CPT4 Code Description Modifier [id number] - WC PHYS LEVEL 3 - EST PT ICD-10 Diagnosis Description L97.822 Non-pressure chronic ulcer of other part of left lower leg with fat layer exposed I87.312 Chronic venous hypertension (idiopathic) with ulcer  of left lower extremity Quantity: 1 Electronic Signature(s) Signed: 10/05/2022 11:34:26 AM By: [name] Corwin DO Entered By: [name] Evans on 10/05/2022 09:04:12

## 2022-10-12 ENCOUNTER — Encounter (HOSPITAL_BASED_OUTPATIENT_CLINIC_OR_DEPARTMENT_OTHER): Payer: BC Managed Care – PPO | Admitting: Internal Medicine

## 2022-10-12 DIAGNOSIS — L97822 Non-pressure chronic ulcer of other part of left lower leg with fat layer exposed: Secondary | ICD-10-CM | POA: Diagnosis not present

## 2022-10-12 DIAGNOSIS — I87312 Chronic venous hypertension (idiopathic) with ulcer of left lower extremity: Secondary | ICD-10-CM

## 2022-10-12 NOTE — Progress Notes (Signed)
KAHLAN, ENGEBRETSON ([id number]) [id number] Page 1 of 7 Visit Report for 10/12/2022 Chief Complaint Document Details Patient Name: Date of Service: [name] Evans, [name] Evans 10/12/2022 1:45 PM Medical Record Number: [id number] Patient Account Number: [id number] Date of Birth/Sex: Treating RN: Feb 02, 1976 (46 y.o. F) Primary Care Provider: PA [name] Evans, NO Other Clinician: Referring Provider: Treating Provider/Extender: [name] Evans in Treatment: 2 Information Obtained from: Patient Chief Complaint 09/28/2022; left lower extremity wound Electronic Signature(s) Signed: 10/12/2022 2:58:01 PM By: [name] Corwin DO Entered By: [name] Evans on 10/12/2022 14:41:45 -------------------------------------------------------------------------------- HPI Details Patient Name: Date of Service: [name] Evans, [name] Evans 10/12/2022 1:45 PM Medical Record Number: [id number] Patient Account Number: [id number] Date of Birth/Sex: Treating RN: [birth year] (46 y.o. F) Primary Care Provider: PA [name] Evans, NO Other Clinician: Referring Provider: Treating Provider/Extender: [name] Evans in Treatment: 2 History of Present Illness HPI Description: Admission 09/28/2022 Ms. 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 She states that her mother had a history of venous insufficiency with wounds as well. She has been using bacitracin to the wound bed. She has chronic pain to the area. She was seen in the ED for this issue on 10/21 and 10/24. She completed a 10-day course of doxycycline and Keflex for this issue. She states she works at [employer] and is on her feet for over 12 hours a day. She is not using compression therapy. She currently denies systemic signs of infection. 11/20; patient presents for  follow-up. She has been using Medihoney and Hydrofera Blue to the wound bed. She reports improvement in pain however still has tenderness to the wound sites. She states that she has been elevating her leg and using an Ace bandage. She has been out of work for the past week. She had a PCR culture done at last clinic visit that grew coag negative staph and Candida parapsilosis. Keystone antibiotic ointment has been ordered. 11/27; patient presents for follow-up. She received the [facility] antibiotic ointment and started this 4 days ago. She states that the wound site is still very tender. She has been using an Ace wrap for compression as she cannot tolerate anything tighter. Electronic Signature(s) Signed: 10/12/2022 2:58:01 PM By: [name] Corwin DO Entered By: [name] Evans on 10/12/2022 14:42:24 [name] Evans, [name] Evans ([id number]) [id number] Page 2 of 7 -------------------------------------------------------------------------------- Physical Exam Details Patient Name: Date of Service: [name], Evans 10/12/2022 1:45 PM Medical Record Number: [id number] Patient Account Number: [id number] Date of Birth/Sex: Treating RN: September 20, 1976 (46 y.o. F) Primary Care Provider: PA [name] Evans, NO Other Clinician: Referring Provider: Treating Provider/Extender: [name] Evans in Treatment: 2 Constitutional respirations regular, non-labored and within target range for patient.. Cardiovascular 2+ dorsalis pedis/posterior tibialis pulses. Psychiatric pleasant and cooperative. Notes Left lower extremity: 2+ pitting edema to the knee. T the distal medial aspect there is a large open wound with nonviable tissue and granulation tissue. No o increased warmth, erythema or purulent drainage. There is a small open wound to the left dorsal foot also with granulation tissue. Electronic Signature(s) Signed: 10/12/2022 2:58:01 PM By: [name] Corwin DO Entered By: [name] Evans on  10/12/2022 14:42:53 -------------------------------------------------------------------------------- Physician Orders Details Patient Name: Date of Service: [name] Evans, [name] Evans 10/12/2022 1:45 PM Medical Record Number: [id number] Patient Account Number: [id number] Date of Birth/Sex: Treating RN: Jul 03, 1976 (46 y.o. Arta Silence Primary Care Provider:  PA TIENT, NO Other Clinician: Referring Provider: Treating Provider/Extender: [name] Evans in Treatment: 2 Verbal / Phone Orders: No Diagnosis Coding ICD-10 Coding Code Description [id number] Non-pressure chronic ulcer of other part of left lower leg with fat layer exposed I87.312 Chronic venous hypertension (idiopathic) with ulcer of left lower extremity Follow-up Appointments ppointment in 1 week. - w/ [name] Evans Return A ppointment in 2 weeks. - [name] Evans Return A Other: - Continue to bring in topical antibiotics at each appt time. Anesthetic (In clinic) Topical Lidocaine 5% applied to wound bed Bathing/ Shower/ Hygiene May shower with protection but do not get wound dressing(s) wet. Edema Control - Lymphedema / SCD / Other Elevate legs to the level of the heart or above for 30 minutes daily and/or when sitting, a frequency of: Avoid standing for long periods of time. Wound Treatment Wound #1 - Lower Leg Wound Laterality: Left, Medial Cleanser: Soap and Water 1 x Per Day/15 Days Discharge Instructions: May shower and wash wound with dial antibacterial soap and water prior to dressing change. Cleanser: Wound Cleanser (Generic) 1 x Per Day/15 Days Discharge Instructions: Cleanse the wound with wound cleanser prior to applying a clean dressing using gauze sponges, not tissue or cotton balls. [name], Evans ([id number]) [id number] Page 3 of 7 Topical: Compounding T opical antibiotics 1 x Per Day/15 Days Discharge Instructions: apply directly to wound bed. Prim Dressing: Hydrofera Blue Ready  Foam, 4x5 in (Generic) 1 x Per Day/15 Days ary Discharge Instructions: Apply over the topical antibiotics. Secondary Dressing: ABD Pad, 5x9 (Generic) 1 x Per Day/15 Days Discharge Instructions: Apply over primary dressing as directed. Secondary Dressing: Woven Gauze Sponge, Non-Sterile 4x4 in (Generic) 1 x Per Day/15 Days Discharge Instructions: Apply over primary dressing as directed. Secured With: [employer], 4.5x3.1 (in/yd) (Generic) 1 x Per Day/15 Days Discharge Instructions: Secure with Kerlix as directed. Secured With: 66M Medipore H Soft Cloth Surgical T ape, 4 x 10 (in/yd) (Generic) 1 x Per Day/15 Days Discharge Instructions: Secure with tape as directed. Secured With: ace wrap 1 x Per Day/15 Days Discharge Instructions: apply in morning and remove at night Wound #2 - Foot Wound Laterality: Dorsal, Left Cleanser: Soap and Water 1 x Per Day/15 Days Discharge Instructions: May shower and wash wound with dial antibacterial soap and water prior to dressing change. Cleanser: Wound Cleanser (Generic) 1 x Per Day/15 Days Discharge Instructions: Cleanse the wound with wound cleanser prior to applying a clean dressing using gauze sponges, not tissue or cotton balls. Topical: Compounding T opical antibiotics 1 x Per Day/15 Days Discharge Instructions: apply directly to wound bed. Prim Dressing: Hydrofera Blue Ready Foam, 4x5 in (Generic) 1 x Per Day/15 Days ary Discharge Instructions: Apply over the topical antibiotics. Secondary Dressing: ABD Pad, 5x9 (Generic) 1 x Per Day/15 Days Discharge Instructions: Apply over primary dressing as directed. Secondary Dressing: Woven Gauze Sponge, Non-Sterile 4x4 in (Generic) 1 x Per Day/15 Days Discharge Instructions: Apply over primary dressing as directed. Secured With: [employer], 4.5x3.1 (in/yd) (Generic) 1 x Per Day/15 Days Discharge Instructions: Secure with Kerlix as directed. Secured With: 66M Medipore H Soft Cloth Surgical T  ape, 4 x 10 (in/yd) (Generic) 1 x Per Day/15 Days Discharge Instructions: Secure with tape as directed. Secured With: ace wrap 1 x Per Day/15 Days Discharge Instructions: apply in morning and remove at night Electronic Signature(s) Signed: 10/12/2022 2:58:01 PM By: [name] Corwin DO Entered By: [name] Evans on 10/12/2022 14:43:07 -------------------------------------------------------------------------------- Problem List Details Patient Name:  Date of Service: [name] Evans, [name] Evans 10/12/2022 1:45 PM Medical Record Number: [id number] Patient Account Number: [id number] Date of Birth/Sex: Treating RN: 11/16/1976 (46 y.o. Arta SilenceF) Deaton, Bobbi Primary Care Provider: PA [name] JordanIENT, [place] Other Clinician: Referring Provider: Treating Provider/Extender: [name] FrancoHoffman, Bilal Manzer Weeks in Treatment: 2 Active Problems ICD-10 Graley, [name] LankJACQUELINE ([id number]) [id number] Page 4 of 7 Encounter Code Description Active Date MDM Diagnosis [phone] Non-pressure chronic ulcer of other part of left lower leg with fat layer 09/28/2022 No Yes exposed I87.312 Chronic venous hypertension (idiopathic) with ulcer of left lower extremity 09/28/2022 No Yes Inactive Problems Resolved Problems Electronic Signature(s) Signed: 10/12/2022 2:58:01 PM By: [name] CorwinHoffman, Kim Oki DO Entered By: [name] CorwinHoffman, Yaw Escoto on 10/12/2022 14:41:33 -------------------------------------------------------------------------------- Progress Note Details Patient Name: Date of Service: [name] Evans, [name] Evans 10/12/2022 1:45 PM Medical Record Number: [id number] Patient Account Number: [id number] Date of Birth/Sex: Treating RN: 06/20/1976 (46 y.o. F) Primary Care Provider: PA [name] JordanIENT, NO Other Clinician: Referring Provider: Treating Provider/Extender: [name] FrancoHoffman, [place] Marney Weeks in Treatment: 2 Subjective Chief Complaint Information obtained from Patient 09/28/2022; left lower extremity wound History of Present Illness (HPI) Admission  09/28/2022 Ms. 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 She states that her mother had a history of venous insufficiency with wounds as well. She has been using bacitracin to the wound bed. She has chronic pain to the area. She was seen in the ED for this issue on 10/21 and 10/24. She completed a 10-day course of doxycycline and Keflex for this issue. She states she works at [employer] and is on her feet for over 12 hours a day. She is not using compression therapy. She currently denies systemic signs of infection. 11/20; patient presents for follow-up. She has been using Medihoney and Hydrofera Blue to the wound bed. She reports improvement in pain however still has tenderness to the wound sites. She states that she has been elevating her leg and using an Ace bandage. She has been out of work for the past week. She had a PCR culture done at last clinic visit that grew coag negative staph and Candida parapsilosis. Keystone antibiotic ointment has been ordered. 11/27; patient presents for follow-up. [facility] and started this 4 days ago. She states that the wound site is still very tender. She has been using an Ace wrap for compression as she cannot tolerate anything tighter. Patient History Information obtained from Patient, Chart. Family History Unknown History, Cancer - Father, Hypertension - Mother, Lung Disease - Father, Thyroid Problems - Mother,Siblings, No family history of Tuberculosis. Social History Current every day smoker - e-ciggs, Alcohol Use - Never, Drug Use - No History, Caffeine Use - Rarely. Medical History Cardiovascular Patient has history of Peripheral Venous Disease Hospitalization/Surgery History - IVC filter. - tubal ligation. Medical A Surgical  History Notes nd Hematologic/Lymphatic DVT Musculoskeletal scoliosis [name] Evans, [name] Evans ([id number]) [id number] Page 5 of 7 Objective Constitutional respirations regular, non-labored and within target range for patient.. Vitals Time Taken: 2:15 PM, Height: 65 in, Weight: 115 lbs, BMI: 19.1, Temperature: 98.2 F, Pulse: 102 bpm, Respiratory Rate: 20 breaths/min, Blood Pressure: 135/82 mmHg. Cardiovascular 2+ dorsalis pedis/posterior tibialis pulses. Psychiatric pleasant and cooperative. General Notes: Left lower extremity: 2+ pitting edema to the knee. T the distal medial aspect there is a large open wound with nonviable tissue and granulation  o tissue. No increased warmth, erythema or purulent drainage. There is a small open wound to the left dorsal foot also with granulation tissue. Integumentary (Hair, Skin) Wound #1 status is Open. Original cause of wound was Laceration. The date acquired was: 04/16/2022. The wound has been in treatment 2 weeks. The wound is located on the Left,Medial Lower Leg. The wound measures 5.7cm length x 3.5cm width x 0.1cm depth; 15.669cm^2 area and 1.567cm^3 volume. There is Fat Layer (Subcutaneous Tissue) exposed. There is no tunneling or undermining noted. There is a medium amount of serosanguineous drainage noted. The wound margin is distinct with the outline attached to the wound base. There is large (67-100%) red, hyper - granulation within the wound bed. There is a small (1-33%) amount of necrotic tissue within the wound bed including Adherent Slough. The periwound skin appearance exhibited: Scarring, Ecchymosis, Hemosiderin Staining. The periwound skin appearance did not exhibit: Callus, Crepitus, Excoriation, Induration, Rash, Dry/Scaly, Maceration, Atrophie Blanche, Cyanosis, Mottled, Pallor, Rubor, Erythema. Wound #2 status is Open. Original cause of wound was Gradually Appeared. The date acquired was: 07/17/2022. The wound  has been in treatment 2 weeks. The wound is located on the Left,Dorsal Foot. The wound measures 1.1cm length x 0.5cm width x 0.1cm depth; 0.432cm^2 area and 0.043cm^3 volume. There is Fat Layer (Subcutaneous Tissue) exposed. There is no tunneling or undermining noted. There is a medium amount of serosanguineous drainage noted. The wound margin is distinct with the outline attached to the wound base. There is large (67-100%) red granulation within the wound bed. There is a small (1-33%) amount of necrotic tissue within the wound bed including Adherent Slough. The periwound skin appearance had no abnormalities noted for moisture. The periwound skin appearance exhibited: Hemosiderin Staining. Periwound temperature was noted as No Abnormality. 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 I recommended continuing Keystone and adding Hydrofera Blue. Continue Ace wrap as she cannot tolerate any other compression due to pain. Follow-up in 1 week and we may try a compression wrap at that time. Plan Follow-up Appointments: Return Appointment in 1 week. - w/ [name] Evans Return Appointment in 2 weeks. - [name] Evans Other: - Continue to bring in topical antibiotics at each appt time. Anesthetic: (In clinic) Topical Lidocaine 5% applied to wound bed Bathing/ Shower/ Hygiene: May shower with protection but do not get wound dressing(s) wet. Edema Control - Lymphedema / SCD / Other: Elevate legs to the level of the heart or above for 30 minutes daily and/or when sitting, a frequency of: Avoid standing for long periods of time. WOUND #1: - Lower Leg Wound Laterality: Left, Medial Cleanser: Soap and Water 1 x Per Day/15 Days Discharge Instructions: May shower and wash wound with dial  antibacterial soap and water prior to dressing change. Cleanser: Wound Cleanser (Generic) 1 x Per Day/15 Days Discharge Instructions: Cleanse the wound with wound cleanser prior to applying a clean dressing using gauze sponges, not tissue or cotton balls. Topical: Compounding T opical antibiotics 1 x Per Day/15 Days Discharge Instructions: apply directly to wound bed. Prim Dressing: Hydrofera Blue Ready Foam, 4x5 in (Generic) 1 x Per Day/15 Days ary Discharge Instructions: Apply over the topical antibiotics. Secondary Dressing: ABD Pad, 5x9 (Generic) 1 x Per Day/15 Days LEZLI, DANEK ([id number]) [phone].pdf Page 6 of 7 Discharge Instructions: Apply  over primary dressing as directed. Secondary Dressing: Woven Gauze Sponge, Non-Sterile 4x4 in (Generic) 1 x Per Day/15 Days Discharge Instructions: Apply over primary dressing as directed. Secured With: [employer], 4.5x3.1 (in/yd) (Generic) 1 x Per Day/15 Days Discharge Instructions: Secure with Kerlix as directed. Secured With: 50M Medipore H Soft Cloth Surgical T ape, 4 x 10 (in/yd) (Generic) 1 x Per Day/15 Days Discharge Instructions: Secure with tape as directed. Secured With: ace wrap 1 x Per Day/15 Days Discharge Instructions: apply in morning and remove at night WOUND #2: - Foot Wound Laterality: Dorsal, Left Cleanser: Soap and Water 1 x Per Day/15 Days Discharge Instructions: May shower and wash wound with dial antibacterial soap and water prior to dressing change. Cleanser: Wound Cleanser (Generic) 1 x Per Day/15 Days Discharge Instructions: Cleanse the wound with wound cleanser prior to applying a clean dressing using gauze sponges, not tissue or cotton balls. Topical: Compounding T opical antibiotics 1 x Per Day/15 Days Discharge Instructions: apply directly to wound bed. Prim Dressing: Hydrofera Blue Ready Foam, 4x5 in (Generic) 1 x Per Day/15 Days ary Discharge Instructions: Apply over the  topical antibiotics. Secondary Dressing: ABD Pad, 5x9 (Generic) 1 x Per Day/15 Days Discharge Instructions: Apply over primary dressing as directed. Secondary Dressing: Woven Gauze Sponge, Non-Sterile 4x4 in (Generic) 1 x Per Day/15 Days Discharge Instructions: Apply over primary dressing as directed. Secured With: [employer], 4.5x3.1 (in/yd) (Generic) 1 x Per Day/15 Days Discharge Instructions: Secure with Kerlix as directed. Secured With: 50M Medipore H Soft Cloth Surgical T ape, 4 x 10 (in/yd) (Generic) 1 x Per Day/15 Days Discharge Instructions: Secure with tape as directed. Secured With: ace wrap 1 x Per Day/15 Days Discharge Instructions: apply in morning and remove at night 1. Hydrofera Blue with Keystone antibiotic ointment 2. Ace wrap 3. Follow-up in 1 week Electronic Signature(s) Signed: 10/12/2022 2:58:01 PM By: [name] Corwin DO Entered By: [name] Evans on 10/12/2022 14:44:10 -------------------------------------------------------------------------------- HxROS Details Patient Name: Date of Service: KANDA, DELUNA 10/12/2022 1:45 PM Medical Record Number: [id number] Patient Account Number: [id number] Date of Birth/Sex: Treating RN: 29-Oct-1976 (46 y.o. F) Primary Care Provider: PA [name] Evans, NO Other Clinician: Referring Provider: Treating Provider/Extender: [name] Evans in Treatment: 2 Information Obtained From Patient Chart Hematologic/Lymphatic Medical History: Past Medical History Notes: DVT Cardiovascular Medical History: Positive for: Peripheral Venous Disease Musculoskeletal Medical History: Past Medical History Notes: scoliosis Immunizations Pneumococcal Vaccine: Received Pneumococcal Vaccination: No Ellithorpe, [name] Evans ([id number]) [id number] Page 7 of 7 Implantable Devices None Hospitalization / Surgery History Type of Hospitalization/Surgery IVC filter tubal ligation Family and Social  History Unknown History: Yes; Cancer: Yes - Father; Hypertension: Yes - Mother; Lung Disease: Yes - Father; Thyroid Problems: Yes - Mother,Siblings; Tuberculosis: No; Current every day smoker - e-ciggs; Alcohol Use: Never; Drug Use: No History; Caffeine Use: Rarely; Financial Concerns: No; Food, Clothing or Shelter Needs: No; Support System Lacking: No; Transportation Concerns: No Electronic Signature(s) Signed: 10/12/2022 2:58:01 PM By: [name] Corwin DO Entered By: [name] Evans on 10/12/2022 14:42:29 -------------------------------------------------------------------------------- SuperBill Details Patient Name: Date of Service: NYELAH, EMMERICH 10/12/2022 Medical Record Number: [id number] Patient Account Number: [id number] Date of Birth/Sex: Treating RN: 02/21/1976 (46 y.o. F) Primary Care Provider: PA TIENT, NO Other Clinician: Referring Provider: Treating Provider/Extender: [name] Evans in Treatment: 2 Diagnosis Coding ICD-10 Codes Code Description [phone] Non-pressure chronic ulcer of other part of left lower leg with fat layer exposed I87.312 Chronic venous hypertension (idiopathic) with ulcer of left lower extremity  Physician Procedures : CPT4 Code Description Modifier [phone] [id number] - WC PHYS LEVEL 3 - EST PT ICD-10 Diagnosis Description L97.822 Non-pressure chronic ulcer of other part of left lower leg with fat layer exposed I87.312 Chronic venous hypertension (idiopathic) with ulcer  of left lower extremity Quantity: 1 Electronic Signature(s) Signed: 10/12/2022 2:58:01 PM By: [name] Corwin DO Entered By: [name] Evans on 10/12/2022 14:44:20

## 2022-10-13 NOTE — Progress Notes (Signed)
[name] Evans ([id number]) [id number] Page 1 of 10 Visit Report for 10/12/2022 Arrival Information Details Patient Name: Date of Service: [name] Evans, [name] Evans 10/12/2022 1:45 PM Medical Record Number: [id number] Patient Account Number: [id number] Date of Birth/Sex: Treating RN: [birth year] (46 y.o. [name] Evans Primary Care [name] Evans: PA TIENT, NO Other Clinician: Referring [name] Evans: Treating [name] Evans/Extender: [name] Evans in Treatment: 2 Visit Information History Since Last Visit Added or deleted any medications: No Patient Arrived: Ambulatory Any new allergies or adverse reactions: No Arrival Time: 14:17 Had a fall or experienced change in No Accompanied By: self activities of daily living that may affect Transfer Assistance: None risk of falls: Patient Identification Verified: Yes Signs or symptoms of abuse/neglect since last visito No Secondary Verification Process Completed: Yes Hospitalized since last visit: No Patient Requires Transmission-Based Precautions: No Implantable device outside of the clinic excluding No Patient Has Alerts: No cellular tissue based products placed in the center since last visit: Has Dressing in Place as Prescribed: Yes Pain Present Now: Yes Electronic Signature(s) Signed: 10/12/2022 6:45:57 PM By: Deon Pilling RN, BSN Entered By: Deon Pilling on 10/12/2022 14:17:50 -------------------------------------------------------------------------------- Clinic Level of Care Assessment Details Patient Name: Date of Service: [name] Evans 10/12/2022 1:45 PM Medical Record Number: [id number] Patient Account Number: [id number] Date of Birth/Sex: Treating RN: [birth year] (46 y.o. [name] Evans Primary Care Sahar Ryback: PA TIENT, NO Other Clinician: Referring Keatyn Jawad: Treating [name] Evans/Extender: [name] Evans in Treatment: 2 Clinic Level of Care Assessment Items TOOL 4 Quantity Score X- 1 0 Use  when only an EandM is performed on FOLLOW-UP visit ASSESSMENTS - Nursing Assessment / Reassessment X- 1 10 Reassessment of Co-morbidities (includes updates in patient status) X- 1 5 Reassessment of Adherence to Treatment Plan ASSESSMENTS - Wound and Skin A ssessment / Reassessment []  - 0 Simple Wound Assessment / Reassessment - one wound X- 2 5 Complex Wound Assessment / Reassessment - multiple wounds X- 1 10 Dermatologic / Skin Assessment (not related to wound area) ASSESSMENTS - Focused Assessment X- 1 5 Circumferential Edema Measurements - multi extremities []  - 0 Nutritional Assessment / Counseling / Intervention [name] Evans ([id number] Page 2 of 10 []  - 0 Lower Extremity Assessment (monofilament, tuning fork, pulses) []  - 0 Peripheral Arterial Disease Assessment (using hand held doppler) ASSESSMENTS - Ostomy and/or Continence Assessment and Care []  - 0 Incontinence Assessment and Management []  - 0 Ostomy Care Assessment and Management (repouching, etc.) PROCESS - Coordination of Care []  - 0 Simple Patient / Family Education for ongoing care X- 1 20 Complex (extensive) Patient / Family Education for ongoing care X- 1 10 Staff obtains Programmer, systems, Records, T Results / Process Orders est []  - 0 Staff telephones HHA, Nursing Homes / Clarify orders / etc []  - 0 Routine Transfer to another Facility (non-emergent condition) []  - 0 Routine Hospital Admission (non-emergent condition) []  - 0 New Admissions / Biomedical engineer / Ordering NPWT Apligraf, etc. , []  - 0 Emergency Hospital Admission (emergent condition) []  - 0 Simple Discharge Coordination X- 1 15 Complex (extensive) Discharge Coordination PROCESS - Special Needs []  - 0 Pediatric / Minor Patient Management []  - 0 Isolation Patient Management []  - 0 Hearing / Language / Visual special needs []  - 0 Assessment of Community assistance (transportation, D/C  planning, etc.) []  - 0 Additional assistance / Altered mentation []  - 0 Support Surface(s) Assessment (bed, cushion, seat, etc.) INTERVENTIONS - Wound Cleansing / Measurement []  - 0 Simple Wound Cleansing - one wound X- 2  5 Complex Wound Cleansing - multiple wounds X- 1 5 Wound Imaging (photographs - any number of wounds) []  - 0 Wound Tracing (instead of photographs) []  - 0 Simple Wound Measurement - one wound X- 2 5 Complex Wound Measurement - multiple wounds INTERVENTIONS - Wound Dressings []  - 0 Small Wound Dressing one or multiple wounds []  - 0 Medium Wound Dressing one or multiple wounds X- 1 20 Large Wound Dressing one or multiple wounds []  - 0 Application of Medications - topical []  - 0 Application of Medications - injection INTERVENTIONS - Miscellaneous []  - 0 External ear exam []  - 0 Specimen Collection (cultures, biopsies, blood, body fluids, etc.) []  - 0 Specimen(s) / Culture(s) sent or taken to Lab for analysis []  - 0 Patient Transfer (multiple staff / Civil Service fast streamer / Similar devices) []  - 0 Simple Staple / Suture removal (25 or less) []  - 0 Complex Staple / Suture removal (26 or more) []  - 0 Hypo / Hyperglycemic Management (close monitor of Blood Glucose) Evans, [name] ([id number] Page 3 of 10 []  - 0 Ankle / Brachial Index (ABI) - do not check if billed separately X- 1 5 Vital Signs Has the patient been seen at the hospital within the last three years: Yes Total Score: 135 Level Of Care: New/Established - Level 4 Electronic Signature(s) Signed: 10/12/2022 6:45:57 PM By: Deon Pilling RN, BSN Entered By: Deon Pilling on 10/12/2022 17:23:13 -------------------------------------------------------------------------------- Encounter Discharge Information Details Patient Name: Date of Service: [name] Evans, [name] Evans 10/12/2022 1:45 PM Medical Record Number: [id number] Patient Account Number: [id number] Date of  Birth/Sex: Treating RN: Nov 09, 1976 (46 y.o. [name] Evans Primary Care Ruta Capece: PA [name] Evans, NO Other Clinician: Referring [name] Evans: Treating [name] Evans/Extender: [name] Evans in Treatment: 2 Encounter Discharge Information Items Discharge Condition: Stable Ambulatory Status: Ambulatory Discharge Destination: Home Transportation: Private Auto Accompanied By: father Schedule Follow-up Appointment: Yes Clinical Summary of Care: Electronic Signature(s) Signed: 10/12/2022 6:45:57 PM By: Deon Pilling RN, BSN Entered By: Deon Pilling on 10/12/2022 17:23:41 -------------------------------------------------------------------------------- Lower Extremity Assessment Details Patient Name: Date of Service: [name] Evans, [name] Evans 10/12/2022 1:45 PM Medical Record Number: [id number] Patient Account Number: [id number] Date of Birth/Sex: Treating RN: [birth year] (46 y.o. [name] Evans Primary Care Damaya Channing: PA [name] Evans, [place] Other Clinician: Referring Avaiyah Strubel: Treating Lometa Riggin/Extender: [name] Evans in Treatment: 2 Edema Assessment Assessed: [Left: Yes] [Right: No] Edema: [Left: Ye] [Right: s] Calf Left: Right: Point of Measurement: 28 cm From Medial Instep 31 cm Ankle Left: Right: Point of Measurement: 10 cm From Medial Instep 22 cm Vascular Assessment [name] Evans, [name] Evans ([id number]) [Right:[id number] Page 4 of 10] Pulses: Dorsalis Pedis Palpable: [Left:Yes] Electronic Signature(s) Signed: 10/12/2022 6:45:57 PM By: Deon Pilling RN, BSN Entered By: Deon Pilling on 10/12/2022 14:18:23 -------------------------------------------------------------------------------- Multi Wound Chart Details Patient Name: Date of Service: [name] Evans, [name] Evans 10/12/2022 1:45 PM Medical Record Number: [id number] Patient Account Number: [id number] Date of Birth/Sex: Treating RN: [birth year] (46 y.o. F) Primary Care Emric Kowalewski: PA TIENT, NO Other Clinician: Referring  Leonidus Rowand: Treating Braxtin Bamba/Extender: [name] Evans in Treatment: 2 Vital Signs Height(in): 65 Pulse(bpm): 102 Weight(lbs): 115 Blood Pressure(mmHg): 135/82 Body Mass Index(BMI): 19.1 Temperature(F): 98.2 Respiratory Rate(breaths/min): 20 [1:Photos:] [N/A:N/A] Left, Medial Lower Leg Left, Dorsal Foot N/A Wound Location: Laceration Gradually Appeared N/A Wounding Event: Venous Leg Ulcer Venous Leg Ulcer N/A Primary Etiology: Peripheral Venous Disease Peripheral Venous Disease N/A Comorbid History: 04/16/2022 07/17/2022 N/A Date Acquired: 2 2 N/A Weeks of Treatment: Open Open N/A Wound Status: No No N/A Wound Recurrence: 5.7x3.5x0.1  1.1x0.5x0.1 N/A Measurements L x W x D (cm) 15.669 0.432 N/A A (cm) : rea 1.567 0.043 N/A Volume (cm) : 28.70% 68.70% N/A % Reduction in A rea: 64.40% 68.80% N/A % Reduction in Volume: Full Thickness Without Exposed Full Thickness Without Exposed N/A Classification: Support Structures Support Structures Medium Medium N/A Exudate Amount: Serosanguineous Serosanguineous N/A Exudate Type: red, brown red, brown N/A Exudate Color: Distinct, outline attached Distinct, outline attached N/A Wound Margin: Large (67-100%) Large (67-100%) N/A Granulation Amount: Red, Hyper-granulation Red N/A Granulation Quality: Small (1-33%) Small (1-33%) N/A Necrotic Amount: Fat Layer (Subcutaneous Tissue): Yes Fat Layer (Subcutaneous Tissue): Yes N/A Exposed Structures: Fascia: No Fascia: No Tendon: No Tendon: No Muscle: No Muscle: No Joint: No Joint: No Bone: No Bone: No Small (1-33%) Small (1-33%) N/A Epithelialization: Scarring: Yes N/A Periwound Skin Texture: Excoriation: No Induration: No Callus: No Crepitus: No Rash: No [name] Evans, [name] Evans ([id number]) [id number] Page 5 of 10 Maceration: No No Abnormalities Noted N/A Periwound Skin Moisture: Dry/Scaly: No Ecchymosis: Yes Hemosiderin Staining:  Yes N/A Periwound Skin Color: Hemosiderin Staining: Yes Atrophie Blanche: No Cyanosis: No Erythema: No Mottled: No Pallor: No Rubor: No N/A No Abnormality N/A Temperature: Treatment Notes Electronic Signature(s) Signed: 10/12/2022 2:58:01 PM By: Geralyn Corwin DO Entered By: Geralyn Corwin on 10/12/2022 14:41:39 -------------------------------------------------------------------------------- Multi-Disciplinary Care Plan Details Patient Name: Date of Service: KIRSTAN, FENTRESS 10/12/2022 1:45 PM Medical Record Number: [id number] Patient Account Number: [id number] Date of Birth/Sex: Treating RN: 1975-12-01 (46 y.o. Arta Silence Primary Care Rosie Golson: PA Zenovia Jordan, NO Other Clinician: Referring Tramon Crescenzo: Treating Shraga Custard/Extender: Tilda Franco in Treatment: 2 Active Inactive Wound/Skin Impairment Nursing Diagnoses: Impaired tissue integrity Knowledge deficit related to ulceration/compromised skin integrity Goals: Patient will have a decrease in wound volume by X% from date: (specify in notes) Date Initiated: 09/16/2022 Target Resolution Date: 12/11/2022 Goal Status: Active Patient/caregiver will verbalize understanding of skin care regimen Date Initiated: 09/16/2022 Target Resolution Date: 12/11/2022 Goal Status: Active Ulcer/skin breakdown will have a volume reduction of 30% by week 4 Date Initiated: 09/16/2022 Target Resolution Date: 10/23/2022 Goal Status: Active Interventions: Assess patient/caregiver ability to obtain necessary supplies Assess patient/caregiver ability to perform ulcer/skin care regimen upon admission and as needed Assess ulceration(s) every visit Notes: Electronic Signature(s) Signed: 10/12/2022 6:45:57 PM By: Shawn Stall RN, BSN Entered By: Shawn Stall on 10/12/2022 14:40:51 [name] Evans, [name] Evans ([id number]) [id number] Page 6 of  10 -------------------------------------------------------------------------------- Pain Assessment Details Patient Name: Date of Service: [name] Evans, [name] Evans 10/12/2022 1:45 PM Medical Record Number: [id number] Patient Account Number: [id number] Date of Birth/Sex: Treating RN: 1976-03-17 (46 y.o. Arta Silence Primary Care Nonna Renninger: PA Zenovia Jordan, NO Other Clinician: Referring Sylvana Bonk: Treating Yamileth Hayse/Extender: Tilda Franco in Treatment: 2 Active Problems Location of Pain Severity and Description of Pain Patient Has Paino No Site Locations Rate the pain. Current Pain Level: 0 Pain Management and Medication Current Pain Management: Medication: No Cold Application: No Rest: No Massage: No Activity: No T.E.N.S.: No Heat Application: No Leg drop or elevation: No Is the Current Pain Management Adequate: Adequate How does your wound impact your activities of daily livingo Sleep: No Bathing: No Appetite: No Relationship With Others: No Bladder Continence: No Emotions: No Bowel Continence: No Work: No Toileting: No Drive: No Dressing: No Hobbies: No Psychologist, prison and probation services) Signed: 10/12/2022 6:45:57 PM By: Shawn Stall RN, BSN Entered By: Shawn Stall on 10/12/2022 14:18:09 -------------------------------------------------------------------------------- Patient/Caregiver Education Details Patient Name: Date of Service: [name] Evans 11/27/2023andnbsp1:45 PM Medical Record Number: [id number] Patient Account Number: [id number]  Date of Birth/Gender: Treating RN: 1975-12-11 (46 y.o. [name] Evans Primary Care Physician: PA [name] Evans, [place] Other Clinician: Referring Physician: Treating Physician/Extender: [name] Evans in Treatment: 2 Education Assessment Education Provided ToMAJEL, MELO ([id number]) [id number] Page 7 of 10 Patient Education Topics Provided Wound/Skin Impairment: Handouts: Skin Care Do's and  Dont's Methods: Explain/Verbal Responses: Reinforcements needed Electronic Signature(s) Signed: 10/12/2022 6:45:57 PM By: Deon Pilling RN, BSN Entered By: Deon Pilling on 10/12/2022 14:41:01 -------------------------------------------------------------------------------- Wound Assessment Details Patient Name: Date of Service: [name] Evans, [name] Evans 10/12/2022 1:45 PM Medical Record Number: [id number] Patient Account Number: [id number] Date of Birth/Sex: Treating RN: 1975-12-11 (46 y.o. [name] Evans Primary Care Anes Rigel: PA TIENT, NO Other Clinician: Referring Lyndzie Zentz: Treating Anushree Dorsi/Extender: [name] Evans in Treatment: 2 Wound Status Wound Number: 1 Primary Etiology: Venous Leg Ulcer Wound Location: Left, Medial Lower Leg Wound Status: Open Wounding Event: Laceration Comorbid History: Peripheral Venous Disease Date Acquired: 04/16/2022 Weeks Of Treatment: 2 Clustered Wound: No Photos Wound Measurements Length: (cm) 5.7 Width: (cm) 3.5 Depth: (cm) 0.1 Area: (cm) 15.669 Volume: (cm) 1.567 % Reduction in Area: 28.7% % Reduction in Volume: 64.4% Epithelialization: Small (1-33%) Tunneling: No Undermining: No Wound Description Classification: Full Thickness Without Exposed Suppor Wound Margin: Distinct, outline attached Exudate Amount: Medium Exudate Type: Serosanguineous Exudate Color: red, brown t Structures Foul Odor After Cleansing: No Slough/Fibrino Yes Wound Bed Granulation Amount: Large (67-100%) Exposed Structure Granulation Quality: Red, Hyper-granulation Fascia Exposed: No Necrotic Amount: Small (1-33%) Fat Layer (Subcutaneous Tissue) Exposed: Yes Necrotic Quality: Adherent Slough Tendon Exposed: No Muscle Exposed: No Joint Exposed: No Mccartt, Myrtie ([id number] Page 8 of 10 Bone Exposed: No Periwound Skin Texture Texture Color No Abnormalities Noted: No No Abnormalities Noted: No Callus:  No Atrophie Blanche: No Crepitus: No Cyanosis: No Excoriation: No Ecchymosis: Yes Induration: No Erythema: No Rash: No Hemosiderin Staining: Yes Scarring: Yes Mottled: No Pallor: No Moisture Rubor: No No Abnormalities Noted: No Dry / Scaly: No Maceration: No Treatment Notes Wound #1 (Lower Leg) Wound Laterality: Left, Medial Cleanser Soap and Water Discharge Instruction: May shower and wash wound with dial antibacterial soap and water prior to dressing change. Wound Cleanser Discharge Instruction: Cleanse the wound with wound cleanser prior to applying a clean dressing using gauze sponges, not tissue or cotton balls. Peri-Wound Care Topical Compounding T opical antibiotics Discharge Instruction: apply directly to wound bed. Primary Dressing Hydrofera Blue Ready Foam, 4x5 in Discharge Instruction: Apply over the topical antibiotics. Secondary Dressing ABD Pad, 5x9 Discharge Instruction: Apply over primary dressing as directed. Woven Gauze Sponge, Non-Sterile 4x4 in Discharge Instruction: Apply over primary dressing as directed. Secured With [employer], 4.5x3.1 (in/yd) Discharge Instruction: Secure with Kerlix as directed. 57M Medipore H Soft Cloth Surgical T ape, 4 x 10 (in/yd) Discharge Instruction: Secure with tape as directed. ace wrap Discharge Instruction: apply in morning and remove at night Compression Wrap Compression Stockings Add-Ons Electronic Signature(s) Signed: 10/12/2022 6:45:57 PM By: Deon Pilling RN, BSN Entered By: Deon Pilling on 10/12/2022 14:19:36 -------------------------------------------------------------------------------- Wound Assessment Details Patient Name: Date of Service: [name] Evans, [name] Evans 10/12/2022 1:45 PM Medical Record Number: [id number] Patient Account Number: [id number] Date of Birth/Sex: Treating RN: 11-15-1976 (46 y.o. [name] Evans Primary Care Kwynn Schlotter: PA Darnelle Spangle Other Clinician: JULYANNA, HEICHEL  ([id number]) [id number] Page 9 of 10 Referring Tayden Duran: Treating Niaomi Cartaya/Extender: [name] Evans in Treatment: 2 Wound Status Wound Number: 2 Primary Etiology: Venous Leg Ulcer Wound Location: Left, Dorsal Foot Wound Status: Open Wounding Event: Gradually  Appeared Comorbid History: Peripheral Venous Disease Date Acquired: 07/17/2022 Weeks Of Treatment: 2 Clustered Wound: No Photos Wound Measurements Length: (cm) 1.1 Width: (cm) 0.5 Depth: (cm) 0.1 Area: (cm) 0.432 Volume: (cm) 0.043 % Reduction in Area: 68.7% % Reduction in Volume: 68.8% Epithelialization: Small (1-33%) Tunneling: No Undermining: No Wound Description Classification: Full Thickness Without Exposed Support Structures Wound Margin: Distinct, outline attached Exudate Amount: Medium Exudate Type: Serosanguineous Exudate Color: red, brown Foul Odor After Cleansing: No Slough/Fibrino Yes Wound Bed Granulation Amount: Large (67-100%) Exposed Structure Granulation Quality: Red Fascia Exposed: No Necrotic Amount: Small (1-33%) Fat Layer (Subcutaneous Tissue) Exposed: Yes Necrotic Quality: Adherent Slough Tendon Exposed: No Muscle Exposed: No Joint Exposed: No Bone Exposed: No Periwound Skin Texture Texture Color No Abnormalities Noted: No No Abnormalities Noted: No Hemosiderin Staining: Yes Moisture No Abnormalities Noted: Yes Temperature / Pain Temperature: No Abnormality Treatment Notes Wound #2 (Foot) Wound Laterality: Dorsal, Left Cleanser Soap and Water Discharge Instruction: May shower and wash wound with dial antibacterial soap and water prior to dressing change. Wound Cleanser Discharge Instruction: Cleanse the wound with wound cleanser prior to applying a clean dressing using gauze sponges, not tissue or cotton balls. Peri-Wound Care Topical Compounding T opical antibiotics Discharge Instruction: apply directly to wound bed. Primary  Dressing Hydrofera Blue Ready Foam, 4x5 in John Day, Enijah ([id number]) [phone].pdf Page 10 of 10 Discharge Instruction: Apply over the topical antibiotics. Secondary Dressing ABD Pad, 5x9 Discharge Instruction: Apply over primary dressing as directed. Woven Gauze Sponge, Non-Sterile 4x4 in Discharge Instruction: Apply over primary dressing as directed. Secured With [employer], 4.5x3.1 (in/yd) Discharge Instruction: Secure with Kerlix as directed. 62M Medipore H Soft Cloth Surgical T ape, 4 x 10 (in/yd) Discharge Instruction: Secure with tape as directed. ace wrap Discharge Instruction: apply in morning and remove at night Compression Wrap Compression Stockings Add-Ons Electronic Signature(s) Signed: 10/12/2022 6:45:57 PM By: Deon Pilling RN, BSN Entered By: Deon Pilling on 10/12/2022 14:20:02 -------------------------------------------------------------------------------- Vitals Details Patient Name: Date of Service: [name] Evans, [name] Evans 10/12/2022 1:45 PM Medical Record Number: [id number] Patient Account Number: [id number] Date of Birth/Sex: Treating RN: [birth year] (46 y.o. [name] Evans Primary Care Gaylon Bentz: PA TIENT, NO Other Clinician: Referring Zelia Yzaguirre: Treating Kamry Faraci/Extender: [name] Evans in Treatment: 2 Vital Signs Time Taken: 14:15 Temperature (F): 98.2 Height (in): 65 Pulse (bpm): 102 Weight (lbs): 115 Respiratory Rate (breaths/min): 20 Body Mass Index (BMI): 19.1 Blood Pressure (mmHg): 135/82 Reference Range: 80 - 120 mg / dl Electronic Signature(s) Signed: 10/12/2022 6:45:57 PM By: Deon Pilling RN, BSN Entered By: Deon Pilling on 10/12/2022 14:18:02

## 2022-10-19 ENCOUNTER — Encounter (HOSPITAL_BASED_OUTPATIENT_CLINIC_OR_DEPARTMENT_OTHER): Payer: BC Managed Care – PPO | Attending: Internal Medicine | Admitting: Internal Medicine

## 2022-10-19 DIAGNOSIS — L97522 Non-pressure chronic ulcer of other part of left foot with fat layer exposed: Secondary | ICD-10-CM | POA: Insufficient documentation

## 2022-10-19 DIAGNOSIS — L97822 Non-pressure chronic ulcer of other part of left lower leg with fat layer exposed: Secondary | ICD-10-CM | POA: Insufficient documentation

## 2022-10-19 DIAGNOSIS — I87312 Chronic venous hypertension (idiopathic) with ulcer of left lower extremity: Secondary | ICD-10-CM | POA: Insufficient documentation

## 2022-10-19 NOTE — Progress Notes (Signed)
[name] Evans, [name] Evans ([id number]) [id number] Page 1 of 7 Visit Report for 10/19/2022 Chief Complaint Document Details Patient Name: Date of Service: [name] Evans, [name] Evans 10/19/2022 8:00 A M Medical Record Number: [id number] Patient Account Number: [id number] Date of Birth/Sex: Treating RN: 03/23/1976 (46 y.o. F) Primary Care Provider: PA Zenovia Jordan, NO Other Clinician: Referring Provider: Treating Provider/Extender: Tilda Franco in Treatment: 3 Information Obtained from: Patient Chief Complaint 09/28/2022; left lower extremity wound Electronic Signature(s) Signed: 10/19/2022 11:10:27 AM By: Geralyn Corwin DO Entered By: Geralyn Corwin on 10/19/2022 09:40:38 -------------------------------------------------------------------------------- HPI Details Patient Name: Date of Service: [name] Evans, [name] Evans 10/19/2022 8:00 A M Medical Record Number: [id number] Patient Account Number: [id number] Date of Birth/Sex: Treating RN: May 22, 1976 (45 y.o. F) Primary Care Provider: PA Zenovia Jordan, NO Other Clinician: Referring Provider: Treating Provider/Extender: Tilda Franco in Treatment: 3 History of Present Illness HPI Description: Admission 09/28/2022 Ms. 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 She states that her mother had a history of venous insufficiency with wounds as well. She has been using bacitracin to the wound bed. She has chronic pain to the area. She was seen in the ED for this issue on 10/21 and 10/24. She completed a 10-day course of doxycycline and Keflex for this issue. She states she works at [employer] and is on her feet for over 12 hours a day. She is not using compression therapy. She currently denies systemic signs of infection. 11/20; patient presents for  follow-up. She has been using Medihoney and Hydrofera Blue to the wound bed. She reports improvement in pain however still has tenderness to the wound sites. She states that she has been elevating her leg and using an Ace bandage. She has been out of work for the past week. She had a PCR culture done at last clinic visit that grew coag negative staph and Candida parapsilosis. Keystone antibiotic ointment has been ordered. 11/27; patient presents for follow-up. She received the [facility] antibiotic ointment and started this 4 days ago. She states that the wound site is still very tender. She has been using an Ace wrap for compression as she cannot tolerate anything tighter. 12/4; patient presents for follow-up. She has been using Keystone antibiotic with Hydrofera Blue under Ace wrap. She states the chronic pain has improved. She brought in short-term disability papers to be filled out. Electronic Signature(s) Signed: 10/19/2022 11:10:27 AM By: Geralyn Corwin DO Entered By: Geralyn Corwin on 10/19/2022 09:41:14 [name] Evans, [name] Evans ([id number]) [id number] Page 2 of 7 -------------------------------------------------------------------------------- Physical Exam Details Patient Name: Date of Service: [name] Evans, [name] Evans 10/19/2022 8:00 A M Medical Record Number: [id number] Patient Account Number: [id number] Date of Birth/Sex: Treating RN: 1976/02/24 (46 y.o. F) Primary Care Provider: PA Zenovia Jordan, NO Other Clinician: Referring Provider: Treating Provider/Extender: Tilda Franco in Treatment: 3 Constitutional respirations regular, non-labored and within target range for patient.. Cardiovascular 2+ dorsalis pedis/posterior tibialis pulses. Psychiatric pleasant and cooperative. Notes : Left lower extremity: 2+ pitting edema to the knee. T the distal medial aspect there is a large open wound with nonviable tissue and granulation tissue. No o increased warmth,  erythema or purulent drainage. There is a small open wound to the left dorsal foot also with granulation tissue. Electronic Signature(s) Signed: 10/19/2022 11:10:27 AM By: Geralyn Corwin DO Entered By: Geralyn Corwin on 10/19/2022 09:41:39 --------------------------------------------------------------------------------  Physician Orders Details Patient Name: Date of Service: [name] Evans, [name] Evans 10/19/2022 8:00 A M Medical Record Number: [id number] Patient Account Number: [id number] Date of Birth/Sex: Treating RN: 1976-06-05 (46 y.o. Tonita Phoenix, Lauren Primary Care Provider: PA Haig Prophet, NO Other Clinician: Referring Provider: Treating Provider/Extender: Yaakov Guthrie in Treatment: 3 Verbal / Phone Orders: No Diagnosis Coding Follow-up Appointments ppointment in 1 week. - w/ Dr. Heber [place] Return A ppointment in 2 weeks. - Dr. Heber [place] Return A Other: - Continue to bring in topical antibiotics at each appt time. Anesthetic (In clinic) Topical Lidocaine 5% applied to wound bed Bathing/ Shower/ Hygiene May shower with protection but do not get wound dressing(s) wet. Edema Control - Lymphedema / SCD / Other Elevate legs to the level of the heart or above for 30 minutes daily and/or when sitting, a frequency of: Avoid standing for long periods of time. Wound Treatment Wound #1 - Lower Leg Wound Laterality: Left, Medial Cleanser: Soap and Water 1 x Per [id number] Days Discharge Instructions: May shower and wash wound with dial antibacterial soap and water prior to dressing change. Cleanser: Wound Cleanser (Generic) 1 x Per Day/15 Days Discharge Instructions: Cleanse the wound with wound cleanser prior to applying a clean dressing using gauze sponges, not tissue or cotton balls. [name] Evans, [name] Evans ([id number]) [id number] Page 3 of 7 Topical: Compounding T opical antibiotics 1 x Per Day/15 Days Discharge Instructions: apply directly to wound bed. Prim Dressing:  Hydrofera Blue Ready Foam, 4x5 in (Generic) 1 x Per Day/15 Days ary Discharge Instructions: Apply over the topical antibiotics. Secondary Dressing: ABD Pad, 5x9 (Generic) 1 x Per Day/15 Days Discharge Instructions: Apply over primary dressing as directed. Secondary Dressing: Woven Gauze Sponge, Non-Sterile 4x4 in (Generic) 1 x Per Day/15 Days Discharge Instructions: Apply over primary dressing as directed. Compression Wrap: Kerlix Roll 4.5x3.1 (in/yd) 1 x Per [id number] Days Discharge Instructions: Apply Kerlix and Coban compression as directed. Compression Wrap: Coban Self-Adherent Wrap 4x5 (in/yd) 1 x Per Day/15 Days Discharge Instructions: Apply over Kerlix as directed. Wound #2 - Foot Wound Laterality: Dorsal, Left Cleanser: Soap and Water 1 x Per Day/15 Days Discharge Instructions: May shower and wash wound with dial antibacterial soap and water prior to dressing change. Cleanser: Wound Cleanser (Generic) 1 x Per Day/15 Days Discharge Instructions: Cleanse the wound with wound cleanser prior to applying a clean dressing using gauze sponges, not tissue or cotton balls. Topical: Compounding T opical antibiotics 1 x Per Day/15 Days Discharge Instructions: apply directly to wound bed. Prim Dressing: Hydrofera Blue Ready Foam, 4x5 in (Generic) 1 x Per Day/15 Days ary Discharge Instructions: Apply over the topical antibiotics. Secondary Dressing: ABD Pad, 5x9 (Generic) 1 x Per Day/15 Days Discharge Instructions: Apply over primary dressing as directed. Secondary Dressing: Woven Gauze Sponge, Non-Sterile 4x4 in (Generic) 1 x Per Day/15 Days Discharge Instructions: Apply over primary dressing as directed. Compression Wrap: Kerlix Roll 4.5x3.1 (in/yd) 1 x Per [id number] Days Discharge Instructions: Apply Kerlix and Coban compression as directed. Compression Wrap: Coban Self-Adherent Wrap 4x5 (in/yd) 1 x Per Day/15 Days Discharge Instructions: Apply over Kerlix as directed. Services and  Therapies Venous Studies -Bilateral Electronic Signature(s) Signed: 10/19/2022 11:10:27 AM By: Kalman Shan DO Entered By: Kalman Shan on 10/19/2022 09:41:47 -------------------------------------------------------------------------------- Problem List Details Patient Name: Date of Service: [name] Evans, [name] Evans 10/19/2022 8:00 A M Medical Record Number: [id number] Patient Account Number: [id number] Date of Birth/Sex: Treating RN: [birth year] (46 y.o. F) Primary Care Provider: PA TIENT, NO Other Clinician: Referring Provider:  Treating Provider/Extender: Yaakov Guthrie in Treatment: 3 Active Problems ICD-10 Encounter Code Description Active Date MDM Diagnosis Loney, [name] ([id number]) [id number] Page 4 of 7 [phone] Non-pressure chronic ulcer of other part of left lower leg with fat layer 09/28/2022 No Yes exposed I87.312 Chronic venous hypertension (idiopathic) with ulcer of left lower extremity 09/28/2022 No Yes Inactive Problems Resolved Problems Electronic Signature(s) Signed: 10/19/2022 11:10:27 AM By: Kalman Shan DO Entered By: Kalman Shan on 10/19/2022 09:40:06 -------------------------------------------------------------------------------- Progress Note Details Patient Name: Date of Service: [name] Evans, [name] Evans 10/19/2022 8:00 A M Medical Record Number: [id number] Patient Account Number: [id number] Date of Birth/Sex: Treating RN: December 04, 1975 (46 y.o. F) Primary Care Provider: PA Haig Prophet, NO Other Clinician: Referring Provider: Treating Provider/Extender: Yaakov Guthrie in Treatment: 3 Subjective Chief Complaint Information obtained from Patient 09/28/2022; left lower extremity wound History of Present Illness (HPI) Admission 09/28/2022 Ms. 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 She states that her mother had a history of venous insufficiency with wounds as well. She has been using bacitracin to the wound bed. She has chronic pain to the area. She was seen in the ED for this issue on 10/21 and 10/24. She completed a 10-day course of doxycycline and Keflex for this issue. She states she works at [employer] and is on her feet for over 12 hours a day. She is not using compression therapy. She currently denies systemic signs of infection. 11/20; patient presents for follow-up. She has been using Medihoney and Hydrofera Blue to the wound bed. She reports improvement in pain however still has tenderness to the wound sites. She states that she has been elevating her leg and using an Ace bandage. She has been out of work for the past week. She had a PCR culture done at last clinic visit that grew coag negative staph and Candida parapsilosis. Keystone antibiotic ointment has been ordered. 11/27; patient presents for follow-up. She received the [facility] antibiotic ointment and started this 4 days ago. She states that the wound site is still very tender. She has been using an Ace wrap for compression as she cannot tolerate anything tighter. 12/4; patient presents for follow-up. She has been using Keystone antibiotic with Hydrofera Blue under Ace wrap. She states the chronic pain has improved. She brought in short-term disability papers to be filled out. Patient History Information obtained from Patient, Chart. Family History Unknown History, Cancer - Father, Hypertension - Mother, Lung Disease - Father, Thyroid Problems - Mother,Siblings, No family history of Tuberculosis. Social History Current every day smoker - e-ciggs, Alcohol Use - Never, Drug Use - No History, Caffeine Use - Rarely. Medical History Cardiovascular Patient has history of Peripheral Venous Disease Hospitalization/Surgery History - IVC filter. - tubal  ligation. Medical A Surgical History Notes nd Hematologic/Lymphatic DVT Musculoskeletal scoliosis [name] Evans, [name] Evans ([id number]) [phone].pdf Page 5 of 7 Objective Constitutional respirations regular, non-labored and within target range for patient.. Vitals Time Taken: 8:16 AM, Height: 65 in, Weight: 115 lbs, BMI: 19.1, Temperature: 98 F, Pulse: 93 bpm, Respiratory Rate: 17 breaths/min, Blood Pressure: 104/73 mmHg. Cardiovascular 2+ dorsalis pedis/posterior tibialis pulses. Psychiatric pleasant and cooperative. General Notes: : Left lower extremity: 2+ pitting edema to the knee. T the distal medial aspect there is a large open wound with nonviable tissue and o granulation tissue. No  increased warmth, erythema or purulent drainage. There is a small open wound to the left dorsal foot also with granulation tissue. Integumentary (Hair, Skin) Wound #1 status is Open. Original cause of wound was Laceration. The date acquired was: 04/16/2022. The wound has been in treatment 3 weeks. The wound is located on the Left,Medial Lower Leg. The wound measures 5.2cm length x 3.4cm width x 0.1cm depth; 13.886cm^2 area and 1.389cm^3 volume. There is Fat Layer (Subcutaneous Tissue) exposed. There is no tunneling or undermining noted. There is a medium amount of serosanguineous drainage noted. The wound margin is distinct with the outline attached to the wound base. There is large (67-100%) red, hyper - granulation within the wound bed. There is a small (1-33%) amount of necrotic tissue within the wound bed including Adherent Slough. The periwound skin appearance exhibited: Scarring, Ecchymosis, Hemosiderin Staining. The periwound skin appearance did not exhibit: Callus, Crepitus, Excoriation, Induration, Rash, Dry/Scaly, Maceration, Atrophie Blanche, Cyanosis, Mottled, Pallor, Rubor, Erythema. Wound #2 status is Open. Original cause of wound was Gradually Appeared. The date  acquired was: 07/17/2022. The wound has been in treatment 3 weeks. The wound is located on the Left,Dorsal Foot. The wound measures 0.4cm length x 0.3cm width x 0.1cm depth; 0.094cm^2 area and 0.009cm^3 volume. There is Fat Layer (Subcutaneous Tissue) exposed. There is no tunneling or undermining noted. There is a medium amount of serosanguineous drainage noted. The wound margin is distinct with the outline attached to the wound base. There is large (67-100%) red granulation within the wound bed. There is a small (1-33%) amount of necrotic tissue within the wound bed including Adherent Slough. The periwound skin appearance had no abnormalities noted for moisture. The periwound skin appearance exhibited: Hemosiderin Staining. Periwound temperature was noted as No Abnormality. Assessment Active Problems ICD-10 Non-pressure chronic ulcer of other part of left lower leg with fat layer exposed Chronic venous hypertension (idiopathic) with ulcer of left lower extremity Patient's wounds have shown improvement in size and appearance since last clinic visit. At this time I recommended trying a compression wrap as her chronic pain has improved. Will start with Kerlix/Coban. Continue Hydrofera Blue and Keystone antibiotic ointment. She knows to not get this wet or keep this on for more than a week. I also recommended venous reflux studies as she may benefit from an ablation. We discussed this today and it was ordered. Follow-up in 1 week. Plan Follow-up Appointments: Return Appointment in 1 week. - w/ Dr. Heber [place] Return Appointment in 2 weeks. - Dr. Heber  Other: - Continue to bring in topical antibiotics at each appt time. Anesthetic: (In clinic) Topical Lidocaine 5% applied to wound bed Bathing/ Shower/ Hygiene: May shower with protection but do not get wound dressing(s) wet. Edema Control - Lymphedema / SCD / Other: Elevate legs to the level of the heart or above for 30 minutes daily and/or when  sitting, a frequency of: Avoid standing for long periods of time. Services and Therapies ordered were: Venous Studies -Bilateral WOUND #1: - Lower Leg Wound Laterality: Left, Medial Cleanser: Soap and Water 1 x Per Day/15 Days Discharge Instructions: May shower and wash wound with dial antibacterial soap and water prior to dressing change. Cleanser: Wound Cleanser (Generic) 1 x Per Day/15 Days Discharge Instructions: Cleanse the wound with wound cleanser prior to applying a clean dressing using gauze sponges, not tissue or cotton balls. Topical: Compounding T opical antibiotics 1 x Per Day/15 Days Discharge Instructions: apply directly to wound bed. ORCHID, SCHMEISER ([id number]) [id number] Page 6  of 7 Prim Dressing: Hydrofera Blue Ready Foam, 4x5 in (Generic) 1 x Per Day/15 Days ary Discharge Instructions: Apply over the topical antibiotics. Secondary Dressing: ABD Pad, 5x9 (Generic) 1 x Per Day/15 Days Discharge Instructions: Apply over primary dressing as directed. Secondary Dressing: Woven Gauze Sponge, Non-Sterile 4x4 in (Generic) 1 x Per Day/15 Days Discharge Instructions: Apply over primary dressing as directed. Com pression Wrap: Kerlix Roll 4.5x3.1 (in/yd) 1 x Per Day/15 Days Discharge Instructions: Apply Kerlix and Coban compression as directed. Com pression Wrap: Coban Self-Adherent Wrap 4x5 (in/yd) 1 x Per Day/15 Days Discharge Instructions: Apply over Kerlix as directed. WOUND #2: - Foot Wound Laterality: Dorsal, Left Cleanser: Soap and Water 1 x Per Day/15 Days Discharge Instructions: May shower and wash wound with dial antibacterial soap and water prior to dressing change. Cleanser: Wound Cleanser (Generic) 1 x Per Day/15 Days Discharge Instructions: Cleanse the wound with wound cleanser prior to applying a clean dressing using gauze sponges, not tissue or cotton balls. Topical: Compounding T opical antibiotics 1 x Per Day/15 Days Discharge  Instructions: apply directly to wound bed. Prim Dressing: Hydrofera Blue Ready Foam, 4x5 in (Generic) 1 x Per Day/15 Days ary Discharge Instructions: Apply over the topical antibiotics. Secondary Dressing: ABD Pad, 5x9 (Generic) 1 x Per Day/15 Days Discharge Instructions: Apply over primary dressing as directed. Secondary Dressing: Woven Gauze Sponge, Non-Sterile 4x4 in (Generic) 1 x Per Day/15 Days Discharge Instructions: Apply over primary dressing as directed. Com pression Wrap: Kerlix Roll 4.5x3.1 (in/yd) 1 x Per Day/15 Days Discharge Instructions: Apply Kerlix and Coban compression as directed. Com pression Wrap: Coban Self-Adherent Wrap 4x5 (in/yd) 1 x Per Day/15 Days Discharge Instructions: Apply over Kerlix as directed. 1. Keystone antibiotic with Hydrofera Blue under Kerlix/Coban 2. Follow-up in 1 week. 3. Short-term disability paperwork 4. Venous reflux studies Electronic Signature(s) Signed: 10/19/2022 11:10:27 AM By: Kalman Shan DO Entered By: Kalman Shan on 10/19/2022 09:45:26 -------------------------------------------------------------------------------- HxROS Details Patient Name: Date of Service: MYKISHA, LUMIA 10/19/2022 8:00 A M Medical Record Number: [id number] Patient Account Number: [id number] Date of Birth/Sex: Treating RN: 01-25-1976 (46 y.o. F) Primary Care Provider: PA Haig Prophet, NO Other Clinician: Referring Provider: Treating Provider/Extender: Yaakov Guthrie in Treatment: 3 Information Obtained From Patient Chart Hematologic/Lymphatic Medical History: Past Medical History Notes: DVT Cardiovascular Medical History: Positive for: Peripheral Venous Disease Musculoskeletal Medical History: Past Medical History Notes: scoliosis Immunizations Pneumococcal Vaccine: Received Pneumococcal Vaccination: No Boal, [name] ([id number] Page 7 of 7 Implantable Devices None Hospitalization / Surgery  History Type of Hospitalization/Surgery IVC filter tubal ligation Family and Social History Unknown History: Yes; Cancer: Yes - Father; Hypertension: Yes - Mother; Lung Disease: Yes - Father; Thyroid Problems: Yes - Mother,Siblings; Tuberculosis: No; Current every day smoker - e-ciggs; Alcohol Use: Never; Drug Use: No History; Caffeine Use: Rarely; Financial Concerns: No; Food, Clothing or Shelter Needs: No; Support System Lacking: No; Transportation Concerns: No Electronic Signature(s) Signed: 10/19/2022 11:10:27 AM By: Kalman Shan DO Entered By: Kalman Shan on 10/19/2022 09:41:20 -------------------------------------------------------------------------------- SuperBill Details Patient Name: Date of Service: JUDIA, DUHART 10/19/2022 Medical Record Number: [id number] Patient Account Number: [id number] Date of Birth/Sex: Treating RN: Apr 07, 1976 (46 y.o. Tonita Phoenix, Lauren Primary Care Provider: PA TIENT, NO Other Clinician: Referring Provider: Treating Provider/Extender: Yaakov Guthrie in Treatment: 3 Diagnosis Coding ICD-10 Codes Code Description [phone] Non-pressure chronic ulcer of other part of left lower leg with fat layer exposed I87.312 Chronic venous hypertension (idiopathic) with ulcer of left lower extremity Facility  Procedures : CPT4 Code: [id number] Description: [id number] - WOUND CARE VISIT-LEV 4 EST PT Modifier: Quantity: 1 Physician Procedures : CPT4 Code Description Modifier [id number] - WC PHYS LEVEL 3 - EST PT ICD-10 Diagnosis Description L97.822 Non-pressure chronic ulcer of other part of left lower leg with fat layer exposed I87.312 Chronic venous hypertension (idiopathic) with ulcer  of left lower extremity Quantity: 1 Electronic Signature(s) Signed: 10/19/2022 11:10:27 AM By: Kalman Shan DO Entered By: Kalman Shan on 10/19/2022 09:46:04

## 2022-10-19 NOTE — Progress Notes (Signed)
ANNISTEN, MANCHESTER ([id number]) [id number] Page 1 of 11 Visit Report for 10/19/2022 Arrival Information Details Patient Name: Date of Service: [name] Evans, [name] Evans 10/19/2022 8:00 A M Medical Record Number: [id number] Patient Account Number: [id number] Date of Birth/Sex: Treating RN: [birth year] (46 y.o. [name], Lauren Primary Care Insiya Oshea: PA Zenovia Jordan, NO Other Clinician: Referring Genesia Caslin: Treating Texanna Hilburn/Extender: Tilda Franco in Treatment: 3 Visit Information History Since Last Visit Added or deleted any medications: No Patient Arrived: Ambulatory Any new allergies or adverse reactions: No Arrival Time: 08:12 Had a fall or experienced change in No Accompanied By: self activities of daily living that may affect Transfer Assistance: None risk of falls: Patient Identification Verified: Yes Signs or symptoms of abuse/neglect since last visito No Secondary Verification Process Completed: Yes Hospitalized since last visit: No Patient Requires Transmission-Based Precautions: No Implantable device outside of the clinic excluding No Patient Has Alerts: No cellular tissue based products placed in the center since last visit: Has Dressing in Place as Prescribed: Yes Has Compression in Place as Prescribed: Yes Pain Present Now: No Electronic Signature(s) Signed: 10/19/2022 3:46:46 PM By: Fonnie Mu RN Entered By: Fonnie Mu on 10/19/2022 08:12:50 -------------------------------------------------------------------------------- Clinic Level of Care Assessment Details Patient Name: Date of Service: [name] Evans, [name] Evans 10/19/2022 8:00 A M Medical Record Number: [id number] Patient Account Number: [id number] Date of Birth/Sex: Treating RN: 1976/07/06 (46 y.o. [name], Lauren Primary Care Twylah Bennetts: PA TIENT, NO Other Clinician: Referring Rulon Abdalla: Treating Leelynn Whetsel/Extender: Tilda Franco in Treatment: 3 Clinic Level of Care  Assessment Items TOOL 4 Quantity Score X- 1 0 Use when only an EandM is performed on FOLLOW-UP visit ASSESSMENTS - Nursing Assessment / Reassessment X- 1 10 Reassessment of Co-morbidities (includes updates in patient status) X- 1 5 Reassessment of Adherence to Treatment Plan ASSESSMENTS - Wound and Skin A ssessment / Reassessment []  - 0 Simple Wound Assessment / Reassessment - one wound X- 2 5 Complex Wound Assessment / Reassessment - multiple wounds []  - 0 Dermatologic / Skin Assessment (not related to wound area) ASSESSMENTS - Focused Assessment X- 1 5 Circumferential Edema Measurements - multi extremities []  - 0 Nutritional Assessment / Counseling / Intervention IMA, HAFNER (  .pdf Page 2 of 11 []  - 0 Lower Extremity Assessment (monofilament, tuning fork, pulses) []  - 0 Peripheral Arterial Disease Assessment (using hand held doppler) ASSESSMENTS - Ostomy and/or Continence Assessment and Care []  - 0 Incontinence Assessment and Management []  - 0 Ostomy Care Assessment and Management (repouching, etc.) PROCESS - Coordination of Care X - Simple Patient / Family Education for ongoing care 1 15 []  - 0 Complex (extensive) Patient / Family Education for ongoing care X- 1 10 Staff obtains [name], Records, T Results / Process Orders est []  - 0 Staff telephones HHA, Nursing Homes / Clarify orders / etc []  - 0 Routine Transfer to another Facility (non-emergent condition) []  - 0 Routine Hospital Admission (non-emergent condition) []  - 0 New Admissions / [id number] / Ordering NPWT Apligraf, etc. , []  - 0 Emergency Hospital Admission (emergent condition) X- 1 10 Simple Discharge Coordination []  - 0 Complex (extensive) Discharge Coordination PROCESS - Special Needs []  - 0 Pediatric / Minor Patient Management []  - 0 Isolation Patient Management []  - 0 Hearing / Language / Visual special needs []  -  0 Assessment of Community assistance (transportation, D/C planning, etc.) []  - 0 Additional assistance / Altered mentation []  - 0 Support Surface(s) Assessment (bed, cushion, seat, etc.) INTERVENTIONS - Wound Cleansing / Measurement []  -  0 Simple Wound Cleansing - one wound X- 2 5 Complex Wound Cleansing - multiple wounds X- 1 5 Wound Imaging (photographs - any number of wounds)  - 0 Wound Tracing (instead of photographs)  - 0 Simple Wound Measurement - one wound X- 2 5 Complex Wound Measurement - multiple wounds INTERVENTIONS - Wound Dressings  - 0 Small Wound Dressing one or multiple wounds X- 2 15 Medium Wound Dressing one or multiple wounds  - 0 Large Wound Dressing one or multiple wounds  - 0 Application of Medications - topical  - 0 Application of Medications - injection INTERVENTIONS - Miscellaneous  - 0 External ear exam  - 0 Specimen Collection (cultures, biopsies, blood, body fluids, etc.)  - 0 Specimen(s) / Culture(s) sent or taken to Lab for analysis  - 0 Patient Transfer (multiple staff / Nurse, adult / Similar devices)  - 0 Simple Staple / Suture removal (25 or less)  - 0 Complex Staple / Suture removal (26 or more)  - 0 Hypo / Hyperglycemic Management (close monitor of Blood Glucose) Both, Sharleen ([id number]) [id number] Page 3 of 11  - 0 Ankle / Brachial Index (ABI) - do not check if billed separately X- 1 5 Vital Signs Has the patient been seen at the hospital within the last three years: Yes Total Score: 125 Level Of Care: New/Established - Level 4 Electronic Signature(s) Signed: 10/19/2022 3:46:46 PM By: Fonnie Mu RN Entered By: Fonnie Mu on 10/19/2022 09:03:27 -------------------------------------------------------------------------------- Encounter Discharge Information Details Patient Name: Date of Service: [name] Evans, [name] Evans 10/19/2022 8:00 A M Medical Record  Number: [id number] Patient Account Number: [id number] Date of Birth/Sex: Treating RN: [birth year] (46 y.o. [name], Lauren Primary Care Xzayvier Fagin: PA Zenovia Jordan, NO Other Clinician: Referring Gardenia Witter: Treating Rikia Sukhu/Extender: Tilda Franco in Treatment: 3 Encounter Discharge Information Items Discharge Condition: Stable Ambulatory Status: Ambulatory Discharge Destination: Home Transportation: Private Auto Accompanied By: self Schedule Follow-up Appointment: Yes Clinical Summary of Care: Patient Declined Electronic Signature(s) Signed: 10/19/2022 3:46:46 PM By: Fonnie Mu RN Entered By: Fonnie Mu on 10/19/2022 09:04:06 -------------------------------------------------------------------------------- Lower Extremity Assessment Details Patient Name: Date of Service: [name] Evans, [name] Evans 10/19/2022 8:00 A M Medical Record Number: [id number] Patient Account Number: [id number] Date of Birth/Sex: Treating RN: 1976/07/02 (46 y.o. [name], Lauren Primary Care Francisca Harbuck: PA Zenovia Jordan, NO Other Clinician: Referring Evelette Hollern: Treating Pema Thomure/Extender: Tilda Franco in Treatment: 3 Edema Assessment Assessed: [Left: Yes] [Right: No] Edema: [Left: Ye] [Right: s] Calf Left: Right: Point of Measurement: 28 cm From Medial Instep 31 cm Ankle Left: Right: Point of Measurement: 10 cm From Medial Instep 22 cm Vascular Assessment Left: [[id number] Page 4 of 11Right:] Pulses: Dorsalis Pedis Palpable: [[id number] Page 4 of 11Yes] Posterior Tibial Palpable: [[id number] Page 4 of 11Yes] Electronic Signature(s) Signed: 10/19/2022 3:46:46 PM By: Fonnie Mu RN Entered By: Fonnie Mu on 10/19/2022 08:12:23 -------------------------------------------------------------------------------- Multi Wound Chart Details Patient Name: Date of Service: [name] Evans, [name] Evans 10/19/2022 8:00 A  M Medical Record Number: [id number] Patient Account Number: [id number] Date of Birth/Sex: Treating RN: 08-04-1976 (46 y.o. F) Primary Care Coner Gibbard: PA TIENT, NO Other Clinician: Referring Willia Genrich: Treating Maika Mcelveen/Extender: Tilda Franco in Treatment: 3 Vital Signs Height(in): 65 Pulse(bpm): 93 Weight(lbs): 115 Blood Pressure(mmHg): 104/73 Body Mass Index(BMI): 19.1 Temperature(F): 98 Respiratory Rate(breaths/min): 17 [1:Photos:] [N/A:N/A] Left, Medial Lower Leg Left, Dorsal Foot N/A Wound Location: Laceration Gradually Appeared N/A Wounding Event: Venous Leg Ulcer Venous Leg Ulcer N/A Primary Etiology: Peripheral Venous Disease Peripheral Venous Disease N/A Comorbid History: 04/16/2022  07/17/2022 N/A Date Acquired: 3 3 N/A Weeks of Treatment: Open Open N/A Wound Status: No No N/A Wound Recurrence: 5.2x3.4x0.1 0.4x0.3x0.1 N/A Measurements L x W x D (cm) 13.886 0.094 N/A A (cm) : rea 1.389 0.009 N/A Volume (cm) : 36.90% 93.20% N/A % Reduction in A rea: 68.40% 93.50% N/A % Reduction in Volume: Full Thickness Without Exposed Full Thickness Without Exposed N/A Classification: Support Structures Support Structures Medium Medium N/A Exudate Amount: Serosanguineous Serosanguineous N/A Exudate Type: red, brown red, brown N/A Exudate Color: Distinct, outline attached Distinct, outline attached N/A Wound Margin: Large (67-100%) Large (67-100%) N/A Granulation Amount: Red, Hyper-granulation Red N/A Granulation Quality: Small (1-33%) Small (1-33%) N/A Necrotic Amount: Fat Layer (Subcutaneous Tissue): Yes Fat Layer (Subcutaneous Tissue): Yes N/A Exposed Structures: Fascia: No Fascia: No Tendon: No Tendon: No Muscle: No Muscle: No Joint: No Joint: No Bone: No Bone: No Small (1-33%) Small (1-33%) N/A Epithelialization: Scarring: Yes N/A Periwound Skin Texture: Excoriation: No Induration: No Royster, Dula ([id number])  [id number] Page 5 of 11 Callus: No Crepitus: No Rash: No Maceration: No No Abnormalities Noted N/A Periwound Skin Moisture: Dry/Scaly: No Ecchymosis: Yes Hemosiderin Staining: Yes N/A Periwound Skin Color: Hemosiderin Staining: Yes Atrophie Blanche: No Cyanosis: No Erythema: No Mottled: No Pallor: No Rubor: No N/A No Abnormality N/A Temperature: Treatment Notes Wound #1 (Lower Leg) Wound Laterality: Left, Medial Cleanser Soap and Water Discharge Instruction: May shower and wash wound with dial antibacterial soap and water prior to dressing change. Wound Cleanser Discharge Instruction: Cleanse the wound with wound cleanser prior to applying a clean dressing using gauze sponges, not tissue or cotton balls. Peri-Wound Care Topical Compounding T opical antibiotics Discharge Instruction: apply directly to wound bed. Primary Dressing Hydrofera Blue Ready Foam, 4x5 in Discharge Instruction: Apply over the topical antibiotics. Secondary Dressing ABD Pad, 5x9 Discharge Instruction: Apply over primary dressing as directed. Woven Gauze Sponge, Non-Sterile 4x4 in Discharge Instruction: Apply over primary dressing as directed. Secured With Compression Wrap Kerlix Roll 4.5x3.1 (in/yd) Discharge Instruction: Apply Kerlix and Coban compression as directed. Coban Self-Adherent Wrap 4x5 (in/yd) Discharge Instruction: Apply over Kerlix as directed. Compression Stockings Add-Ons Wound #2 (Foot) Wound Laterality: Dorsal, Left Cleanser Soap and Water Discharge Instruction: May shower and wash wound with dial antibacterial soap and water prior to dressing change. Wound Cleanser Discharge Instruction: Cleanse the wound with wound cleanser prior to applying a clean dressing using gauze sponges, not tissue or cotton balls. Peri-Wound Care Topical Compounding T opical antibiotics Discharge Instruction: apply directly to wound bed. Primary Dressing Hydrofera  Blue Ready Foam, 4x5 in Discharge Instruction: Apply over the topical antibiotics. Secondary Dressing ABD Pad, 5x9 Discharge Instruction: Apply over primary dressing as directed. Woven Gauze Sponge, Non-Sterile 4x4 in Discharge Instruction: Apply over primary dressing as directed. TAKHIA, SPOON ([id number]) [id number] Page 6 of 11 Secured With Compression Wrap Kerlix Roll 4.5x3.1 (in/yd) Discharge Instruction: Apply Kerlix and Coban compression as directed. Coban Self-Adherent Wrap 4x5 (in/yd) Discharge Instruction: Apply over Kerlix as directed. Compression Stockings Add-Ons Electronic Signature(s) Signed: 10/19/2022 11:10:27 AM By: Geralyn Corwin DO Entered By: Geralyn Corwin on 10/19/2022 09:40:13 -------------------------------------------------------------------------------- Multi-Disciplinary Care Plan Details Patient Name: Date of Service: [name] Evans, [name] Evans Lindon Romp 10/19/2022 8:00 A M Medical Record Number: [id number] Patient Account Number: [id number] Date of Birth/Sex: Treating RN: January 31, 1976 (46 y.o. [name], Lauren Primary Care Edem Tiegs: PA Zenovia Jordan, NO Other Clinician: Referring Kerrilynn Derenzo: Treating Raniyah Curenton/Extender: Tilda Franco in Treatment: 3 Active Inactive Wound/Skin Impairment Nursing Diagnoses: Impaired tissue integrity Knowledge deficit related to  ulceration/compromised skin integrity Goals: Patient will have a decrease in wound volume by X% from date: (specify in notes) Date Initiated: 09/16/2022 Target Resolution Date: 12/11/2022 Goal Status: Active Patient/caregiver will verbalize understanding of skin care regimen Date Initiated: 09/16/2022 Target Resolution Date: 12/11/2022 Goal Status: Active Ulcer/skin breakdown will have a volume reduction of 30% by week 4 Date Initiated: 09/16/2022 Target Resolution Date: 10/23/2022 Goal Status: Active Interventions: Assess patient/caregiver ability to obtain necessary  supplies Assess patient/caregiver ability to perform ulcer/skin care regimen upon admission and as needed Assess ulceration(s) every visit Notes: Electronic Signature(s) Signed: 10/19/2022 3:46:46 PM By: Fonnie Mu RN Entered By: Fonnie Mu on 10/19/2022 08:37:53 Grosser, [name] ([id number]) [id number] Page 7 of 11 -------------------------------------------------------------------------------- Pain Assessment Details Patient Name: Date of Service: [name] Evans, [name] Evans 10/19/2022 8:00 A M Medical Record Number: [id number] Patient Account Number: [id number] Date of Birth/Sex: Treating RN: 03/09/1976 (46 y.o. [name], Lauren Primary Care Shalunda Lindh: PA Zenovia Jordan, NO Other Clinician: Referring Shiven Junious: Treating Haneen Bernales/Extender: Tilda Franco in Treatment: 3 Active Problems Location of Pain Severity and Description of Pain Patient Has Paino No Site Locations Pain Management and Medication Current Pain Management: Electronic Signature(s) Signed: 10/19/2022 3:46:46 PM By: Fonnie Mu RN Entered By: Fonnie Mu on 10/19/2022 08:12:16 -------------------------------------------------------------------------------- Patient/Caregiver Education Details Patient Name: Date of Service: [name] Evans 12/4/2023andnbsp8:00 A M Medical Record Number: [id number] Patient Account Number: [id number] Date of Birth/Gender: Treating RN: 1976-10-25 (46 y.o. Toniann Fail Primary Care Physician: PA Zenovia Jordan, NO Other Clinician: Referring Physician: Treating Physician/Extender: Tilda Franco in Treatment: 3 Education Assessment Education Provided To: Patient Education Topics Provided Wound/Skin Impairment: Methods: Explain/Verbal Responses: Reinforcements needed, State content correctly [name] Evans, [name] Evans ([id number]) [id number].pdf Page 8 of 11 Electronic Signature(s) Signed: 10/19/2022 3:46:46 PM By:  Fonnie Mu RN Entered By: Fonnie Mu on 10/19/2022 08:38:13 -------------------------------------------------------------------------------- Wound Assessment Details Patient Name: Date of Service: [name] Evans, [name] Evans 10/19/2022 8:00 A M Medical Record Number: [id number] Patient Account Number: [id number] Date of Birth/Sex: Treating RN: 26-Jan-1976 (46 y.o. [name], Lauren Primary Care Aalia Greulich: PA TIENT, NO Other Clinician: Referring Shoshanah Dapper: Treating Aubryn Spinola/Extender: Tilda Franco in Treatment: 3 Wound Status Wound Number: 1 Primary Etiology: Venous Leg Ulcer Wound Location: Left, Medial Lower Leg Wound Status: Open Wounding Event: Laceration Comorbid History: Peripheral Venous Disease Date Acquired: 04/16/2022 Weeks Of Treatment: 3 Clustered Wound: No Photos Wound Measurements Length: (cm) 5.2 Width: (cm) 3.4 Depth: (cm) 0.1 Area: (cm) 13.886 Volume: (cm) 1.389 % Reduction in Area: 36.9% % Reduction in Volume: 68.4% Epithelialization: Small (1-33%) Tunneling: No Undermining: No Wound Description Classification: Full Thickness Without Exposed Suppor Wound Margin: Distinct, outline attached Exudate Amount: Medium Exudate Type: Serosanguineous Exudate Color: red, brown t Structures Foul Odor After Cleansing: No Slough/Fibrino Yes Wound Bed Granulation Amount: Large (67-100%) Exposed Structure Granulation Quality: Red, Hyper-granulation Fascia Exposed: No Necrotic Amount: Small (1-33%) Fat Layer (Subcutaneous Tissue) Exposed: Yes Necrotic Quality: Adherent Slough Tendon Exposed: No Muscle Exposed: No Joint Exposed: No Bone Exposed: No Periwound Skin Texture Texture Color No Abnormalities Noted: No No Abnormalities Noted: No Callus: No Atrophie Blanche: No Crepitus: No Cyanosis: No Excoriation: No EcchymosisKAYLISE, [name] Evans ([id number]) [id number].pdf Page 9 of 11 Induration: No Erythema: No Rash:  No Hemosiderin Staining: Yes Scarring: Yes Mottled: No Pallor: No Moisture Rubor: No No Abnormalities Noted: No Dry / Scaly: No Maceration: No Treatment Notes Wound #1 (Lower Leg) Wound Laterality: Left, Medial Cleanser Soap and Water Discharge Instruction: May shower and wash wound with dial antibacterial soap  and water prior to dressing change. Wound Cleanser Discharge Instruction: Cleanse the wound with wound cleanser prior to applying a clean dressing using gauze sponges, not tissue or cotton balls. Peri-Wound Care Topical Compounding T opical antibiotics Discharge Instruction: apply directly to wound bed. Primary Dressing Hydrofera Blue Ready Foam, 4x5 in Discharge Instruction: Apply over the topical antibiotics. Secondary Dressing ABD Pad, 5x9 Discharge Instruction: Apply over primary dressing as directed. Woven Gauze Sponge, Non-Sterile 4x4 in Discharge Instruction: Apply over primary dressing as directed. Secured With Compression Wrap Kerlix Roll 4.5x3.1 (in/yd) Discharge Instruction: Apply Kerlix and Coban compression as directed. Coban Self-Adherent Wrap 4x5 (in/yd) Discharge Instruction: Apply over Kerlix as directed. Compression Stockings Add-Ons Electronic Signature(s) Signed: 10/19/2022 3:46:46 PM By: Fonnie Mu RN Entered By: Fonnie Mu on 10/19/2022 08:25:15 -------------------------------------------------------------------------------- Wound Assessment Details Patient Name: Date of Service: [name] Evans, [name] Evans 10/19/2022 8:00 A M Medical Record Number: [id number] Patient Account Number: [id number] Date of Birth/Sex: Treating RN: 05/02/1976 (46 y.o. [name], Lauren Primary Care Raysa Bosak: PA Fidela Juneau Other Clinician: Referring Brigette Hopfer: Treating Jisele Price/Extender: Tilda Franco in Treatment: 3 Wound Status Wound Number: 2 Primary Etiology: Venous Leg Ulcer Wound Location: Left, Dorsal Foot Wound Status: Open Wounding  Event: Gradually Appeared Comorbid History: Peripheral Venous Disease Date Acquired: 07/17/2022 Weeks Of Treatment: 3 Clustered Wound: No Kilgallon, Kinsie ([id number]) [id number] Page 10 of 11 Photos Wound Measurements Length: (cm) 0.4 Width: (cm) 0.3 Depth: (cm) 0.1 Area: (cm) 0.094 Volume: (cm) 0.009 % Reduction in Area: 93.2% % Reduction in Volume: 93.5% Epithelialization: Small (1-33%) Tunneling: No Undermining: No Wound Description Classification: Full Thickness Without Exposed Support Structures Wound Margin: Distinct, outline attached Exudate Amount: Medium Exudate Type: Serosanguineous Exudate Color: red, brown Foul Odor After Cleansing: No Slough/Fibrino Yes Wound Bed Granulation Amount: Large (67-100%) Exposed Structure Granulation Quality: Red Fascia Exposed: No Necrotic Amount: Small (1-33%) Fat Layer (Subcutaneous Tissue) Exposed: Yes Necrotic Quality: Adherent Slough Tendon Exposed: No Muscle Exposed: No Joint Exposed: No Bone Exposed: No Periwound Skin Texture Texture Color No Abnormalities Noted: No No Abnormalities Noted: No Hemosiderin Staining: Yes Moisture No Abnormalities Noted: Yes Temperature / Pain Temperature: No Abnormality Treatment Notes Wound #2 (Foot) Wound Laterality: Dorsal, Left Cleanser Soap and Water Discharge Instruction: May shower and wash wound with dial antibacterial soap and water prior to dressing change. Wound Cleanser Discharge Instruction: Cleanse the wound with wound cleanser prior to applying a clean dressing using gauze sponges, not tissue or cotton balls. Peri-Wound Care Topical Compounding T opical antibiotics Discharge Instruction: apply directly to wound bed. Primary Dressing Hydrofera Blue Ready Foam, 4x5 in Discharge Instruction: Apply over the topical antibiotics. Secondary Dressing ABD Pad, 5x9 Discharge Instruction: Apply over primary dressing as directed. Woven Gauze  Sponge, Non-Sterile 4x4 in Discharge Instruction: Apply over primary dressing as directed. Secured With [name] ([id number]) [id number] Page 11 of 11 Compression Wrap Kerlix Roll 4.5x3.1 (in/yd) Discharge Instruction: Apply Kerlix and Coban compression as directed. Coban Self-Adherent Wrap 4x5 (in/yd) Discharge Instruction: Apply over Kerlix as directed. Compression Stockings Add-Ons Electronic Signature(s) Signed: 10/19/2022 3:46:46 PM By: Fonnie Mu RN Entered By: Fonnie Mu on 10/19/2022 08:24:58 -------------------------------------------------------------------------------- Vitals Details Patient Name: Date of Service: PAISLY, FINGERHUT 10/19/2022 8:00 A M Medical Record Number: [id number] Patient Account Number: [id number] Date of Birth/Sex: Treating RN: January 14, 1976 (46 y.o. [name], Lauren Primary Care Kathrene Sinopoli: PA Zenovia Jordan, NO Other Clinician: Referring Derrick Tiegs: Treating Kymber Kosar/Extender: Tilda Franco in Treatment: 3 Vital Signs Time Taken: 08:16 Temperature (F): 98 Height (in): 65  Pulse (bpm): 93 Weight (lbs): 115 Respiratory Rate (breaths/min): 17 Body Mass Index (BMI): 19.1 Blood Pressure (mmHg): 104/73 Reference Range: 80 - 120 mg / dl Electronic Signature(s) Signed: 10/19/2022 3:46:46 PM By: Fonnie MuBreedlove, Lauren RN Entered By: Fonnie MuBreedlove, Lauren on 10/19/2022 08:17:00

## 2022-10-21 ENCOUNTER — Emergency Department
Admission: EM | Admit: 2022-10-21 | Discharge: 2022-10-21 | Discharge status: Against Medical Advice | Payer: BC Managed Care – PPO | Attending: Emergency Medicine | Admitting: Emergency Medicine

## 2022-10-21 DIAGNOSIS — R300 Dysuria: Secondary | ICD-10-CM | POA: Insufficient documentation

## 2022-10-21 DIAGNOSIS — Z5321 Procedure and treatment not carried out due to patient leaving prior to being seen by health care provider: Secondary | ICD-10-CM | POA: Diagnosis not present

## 2022-10-21 LAB — URINALYSIS, ROUTINE W REFLEX MICROSCOPIC
Specific Gravity, Urine: 1.021 (ref 1.005–1.030)
Squamous Epithelial / HPF: NONE SEEN (ref 0–5)

## 2022-10-21 LAB — PREGNANCY, URINE: Preg Test, Ur: NEGATIVE

## 2022-10-21 NOTE — ED Triage Notes (Signed)
Ambulatory to triage with c/o vaginal/pelvic pressure. Sx onging x 2 days. Initially thought she had UTI, prescribed Bactrim from [facility].  Had urinary burning with urination and cloudy urine

## 2022-10-22 LAB — URINE CULTURE: Culture: <10000 — AB

## 2022-10-24 ENCOUNTER — Emergency Department
Admission: EM | Admit: 2022-10-24 | Discharge: 2022-10-24 | Disposition: A | Discharge status: Home / Self Care | Payer: BC Managed Care – PPO | Attending: Emergency Medicine | Admitting: Emergency Medicine

## 2022-10-24 ENCOUNTER — Other Ambulatory Visit: Payer: Self-pay

## 2022-10-24 DIAGNOSIS — B379 Candidiasis, unspecified: Secondary | ICD-10-CM | POA: Insufficient documentation

## 2022-10-24 DIAGNOSIS — B3731 Acute candidiasis of vulva and vagina: Secondary | ICD-10-CM

## 2022-10-24 DIAGNOSIS — N939 Abnormal uterine and vaginal bleeding, unspecified: Secondary | ICD-10-CM | POA: Diagnosis present

## 2022-10-24 LAB — WET PREP, GENITAL
Clue Cells Wet Prep HPF POC: NONE SEEN
Sperm: NONE SEEN
Trich, Wet Prep: NONE SEEN
WBC, Wet Prep HPF POC: <10 (ref <10)

## 2022-10-24 MED ORDER — FLUCONAZOLE 150 MG PO TABS: ORAL_TABLET | ORAL | 0 refills | Status: DC

## 2022-10-24 NOTE — ED Provider Notes (Signed)
[facility] Provider Note    Event Date/Time   First MD Initiated Contact with Patient 10/24/22 1629     (approximate)   History   Vaginal Bleeding   HPI  [name] Evans is a 46 y.o. female with history of frequent UTIs, DVT, ovarian cyst presents emergency department complaining of continued pain and burning along with pressure in the pelvis.  Patient states she thinks it may be more along the urethra.  Was treated for UTI and has finished Septra.  States has not had a lot of bleeding since she finished the antibiotic.  Continues to have the sensation.  No fever or chills.     Physical Exam   Triage Vital Signs: ED Triage Vitals  Enc Vitals Group     BP 10/24/22 1541 (!) 143/77     Pulse Rate 10/24/22 1541 82     Resp 10/24/22 1541 18     Temp 10/24/22 1541 98.4 F (36.9 C)     Temp Source 10/24/22 1541 Oral     SpO2 10/24/22 1541 99 %     Weight 10/24/22 1542 120 lb (54.4 kg)     Height 10/24/22 1542 5\' 5"  (1.651 m)     Head Circumference --      Peak Flow --      Pain Score 10/24/22 1542 0     Pain Loc --      Pain Edu? --      Excl. in GC? --     Most recent vital signs: Vitals:   10/24/22 1541  BP: (!) 143/77  Pulse: 82  Resp: 18  Temp: 98.4 F (36.9 C)  SpO2: 99%     General: Awake, no distress.   CV:  Good peripheral perfusion. regular rate and  rhythm Resp:  Normal effort. Abd:  No distention.  Nontender to palpation Other:  Vaginal exam shows some external irritation along the right labia, no herpetic lesions noted, patient does comment that that is exactly where the burning is coming from, no drainage, some vaginal bleeding noted on speculum exam   ED Results / Procedures / Treatments   Labs (all labs ordered are listed, but only abnormal results are displayed) Labs Reviewed  WET PREP, GENITAL - Abnormal; Notable for the following components:      Result Value   Yeast Wet Prep HPF POC PRESENT (*)    All other  components within normal limits     EKG     RADIOLOGY     PROCEDURES:   Procedures   MEDICATIONS ORDERED IN ED: Medications - No data to display   IMPRESSION / MDM / ASSESSMENT AND PLAN / ED COURSE  I reviewed the triage vital signs and the nursing notes.                              Differential diagnosis includes, but is not limited to, vaginal atrophy, UTI, herpes, bacterial vaginosis  Patient's presentation is most consistent with acute complicated illness / injury requiring diagnostic workup.   Due to the irritation being on the external labia I do feel patient will need CT of the abdomen or further workup.  The area is very superficial and does not appear to be deep.  She did not experience any pain with the speculum exams will not do a ultrasound.  Will still perform the wet prep to assess for bacterial vaginosis or yeast.  Did explain to the patient this is most likely vaginal atrophy secondary to menopause.  We can start her on replenished and then she can follow-up with GYN to see if she needs some estrogen cream.  Wet prep shows yeast  Did explain findings to the patient.  She was placed on Diflucan.  She was encouraged to use replenish.  If she is not improving with these medications she should follow-up with her regular doctor or GYN to discuss estrogen cream.  Patient is in agreement treatment plan.  Discharged stable condition.      FINAL CLINICAL IMPRESSION(S) / ED DIAGNOSES   Final diagnoses:  Vaginal yeast infection     Rx / DC Orders   ED Discharge Orders          Ordered    fluconazole (DIFLUCAN) 150 MG tablet        10/24/22 1757             Note:  This document was prepared using Dragon voice recognition software and may include unintentional dictation errors.    Faythe Ghee, PA-C 10/24/22 Erick Blinks, MD 10/24/22 [phone]

## 2022-10-24 NOTE — ED Triage Notes (Signed)
Pt states she is having pressure in her pelvic area- pt states she is being treated for  UTI by a teledoc but it does not feel like a UTI- today is her last day of antibiotics but the pressure is not getting any better- pt was here for same a couple days ago but LWBS after triage- pt is also now having vaginal bleeding and states she has only had a period twice in the last 6 years

## 2022-10-24 NOTE — Discharge Instructions (Signed)
Follow-up with either your regular doctor, or GYN.  If you do not have a GYN doctor Dr. Dalbert Garnet is on-call today.  Use the replenish and Diflucan as prescribed. If you are worsening please return emergency department

## 2022-10-26 ENCOUNTER — Encounter (HOSPITAL_BASED_OUTPATIENT_CLINIC_OR_DEPARTMENT_OTHER): Payer: BC Managed Care – PPO | Admitting: Internal Medicine

## 2022-10-26 DIAGNOSIS — I87312 Chronic venous hypertension (idiopathic) with ulcer of left lower extremity: Secondary | ICD-10-CM | POA: Diagnosis not present

## 2022-10-26 DIAGNOSIS — L97822 Non-pressure chronic ulcer of other part of left lower leg with fat layer exposed: Secondary | ICD-10-CM | POA: Diagnosis not present

## 2022-10-26 NOTE — Progress Notes (Addendum)
[name] Evans ([id number].pdf Page 1 of 7 Visit Report for 10/26/2022 Chief Complaint Document Details Patient Name: Date of Service: [name] Evans 10/26/2022 2:00 PM Medical Record Number: [id number] Patient Account Number: [id number] Date of Birth/Sex: Treating [name] Evans: 01-Oct-1976 (46 y.o. F) Primary Care Provider: PA [name] Evans, NO Other Clinician: Referring Provider: Treating Provider/Extender: [name] Evans in Treatment: 4 Information Obtained from: Patient Chief Complaint 09/28/2022; left lower extremity wound Electronic Signature(s) Signed: 10/26/2022 3:53:46 PM By: [name] Shan DO Entered By: [name] Evans on 10/26/2022 15:17:40 -------------------------------------------------------------------------------- HPI Details Patient Name: Date of Service: [name] Evans, [name] Evans 10/26/2022 2:00 PM Medical Record Number: [id number] Patient Account Number: [id number] Date of Birth/Sex: Treating [name] Evans: [birth year] (46 y.o. F) Primary Care Provider: PA [name] Evans, NO Other Clinician: Referring Provider: Treating Provider/Extender: [name] Evans in Treatment: 4 History of Present Illness HPI Description: Admission 09/28/2022 Ms. 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 She states that her mother had a history of venous insufficiency with wounds as well. She has been using bacitracin to the wound bed. She has chronic pain to the area. She was seen in the ED for this issue on 10/21 and 10/24. She completed a 10-day course of doxycycline and Keflex for this issue. She states she works at [employer] and is on her feet for over 12 hours a day. She is not using compression therapy. She currently denies systemic signs of infection. 11/20; patient presents for  follow-up. She has been using Medihoney and Hydrofera Blue to the wound bed. She reports improvement in pain however still has tenderness to the wound sites. She states that she has been elevating her leg and using an Ace bandage. She has been out of work for the past week. She had a PCR culture done at last clinic visit that grew coag negative staph and Candida parapsilosis. Keystone antibiotic ointment has been ordered. 11/27; patient presents for follow-up. She received the [facility] antibiotic ointment and started this 4 days ago. She states that the wound site is still very tender. She has been using an Ace wrap for compression as she cannot tolerate anything tighter. 12/4; patient presents for follow-up. She has been using Keystone antibiotic with Hydrofera Blue under Ace wrap. She states the chronic pain has improved. She brought in short-term disability papers to be filled out. 12/11; patient presents for follow-up. We have been using Keystone antibiotic ointment and Hydrofera Blue under Kerlix/Coban. This is the first time with a true compression wrap. She tolerated this well. She has no issues or complaints today. She was contacted by vein and vascular today for her venous reflux studies. She states she is going to call them back to schedule these. Electronic Signature(s) Signed: 10/26/2022 3:53:46 PM By: [name] Shan DO Entered By: [name] Evans on 10/26/2022 15:20:58 [name] Evans ([id number].pdf Page 2 of 7 -------------------------------------------------------------------------------- Physical Exam Details Patient Name: Date of Service: [name] Evans, [name] Evans 10/26/2022 2:00 PM Medical Record Number: [id number] Patient Account Number: [id number] Date of Birth/Sex: Treating [name] Evans: 1976-01-22 (46 y.o. F) Primary Care Provider: PA [name] Evans, NO Other Clinician: Referring Provider: Treating Provider/Extender: [name] Evans in Treatment:  4 Constitutional respirations regular, non-labored and within target range for patient.. Cardiovascular 2+ dorsalis pedis/posterior tibialis pulses. Psychiatric pleasant and cooperative. Notes Left lower extremity: T the distal medial  aspect there is an open wound granulation tissue and scant nonviable tissue. No signs of surrounding infection. There o is epithelization to the left dorsal foot wound. Decent edema control. Electronic Signature(s) Signed: 10/26/2022 3:53:46 PM By: [name] Shan DO Entered By: [name] Evans on 10/26/2022 15:21:21 -------------------------------------------------------------------------------- Physician Orders Details Patient Name: Date of Service: [name] Evans, [name] Evans 10/26/2022 2:00 PM Medical Record Number: [id number] Patient Account Number: [id number] Date of Birth/Sex: Treating [name] Evans: 1976/07/24 (46 y.o. [name] Evans, [name] Evans Primary Care Provider: PA [name] Evans, NO Other Clinician: Referring Provider: Treating Provider/Extender: [name] Evans in Treatment: 4 Verbal / Phone Orders: No Diagnosis Coding Follow-up Appointments ppointment in 1 week. - w/ Dr. Heber [place] Return A ppointment in 2 weeks. - Dr. Heber [place] Return A Other: - Continue to bring in topical antibiotics at each appt time. Anesthetic (In clinic) Topical Lidocaine 5% applied to wound bed Bathing/ Shower/ Hygiene May shower with protection but do not get wound dressing(s) wet. Edema Control - Lymphedema / SCD / Other Elevate legs to the level of the heart or above for 30 minutes daily and/or when sitting, a frequency of: Avoid standing for long periods of time. Wound Treatment Wound #1 - Lower Leg Wound Laterality: Left, Medial Cleanser: Soap and Water 1 x Per [id number] Days Discharge Instructions: May shower and wash wound with dial antibacterial soap and water prior to dressing change. Cleanser: Wound Cleanser (Generic) 1 x Per Day/15 Days Discharge Instructions: Cleanse the  wound with wound cleanser prior to applying a clean dressing using gauze sponges, not tissue or cotton balls. [name] Evans ([id number].pdf Page 3 of 7 Topical: Keystone Compounding T opical antibiotics 1 x Per Day/15 Days Discharge Instructions: apply directly to wound bed. Prim Dressing: Hydrofera Blue Ready Foam, 4x5 in (Generic) 1 x Per Day/15 Days ary Discharge Instructions: Apply over the topical antibiotics. Secondary Dressing: ABD Pad, 5x9 (Generic) 1 x Per Day/15 Days Discharge Instructions: Apply over primary dressing as directed. Secondary Dressing: Woven Gauze Sponge, Non-Sterile 4x4 in (Generic) 1 x Per Day/15 Days Discharge Instructions: Apply over primary dressing as directed. Compression Wrap: ThreePress (3 layer compression wrap) 1 x Per Day/15 Days Discharge Instructions: Apply three layer compression as directed. Electronic Signature(s) Signed: 10/26/2022 3:53:46 PM By: [name] Shan DO Entered By: [name] Evans on 10/26/2022 15:21:30 -------------------------------------------------------------------------------- Problem List Details Patient Name: Date of Service: [name] Evans, [name] Evans 10/26/2022 2:00 PM Medical Record Number: [id number] Patient Account Number: [id number] Date of Birth/Sex: Treating [name] Evans: Jan 05, 1976 (46 y.o. 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 Richard Keenen, [name] ([id number]) [id number] Page 4 of 7 Primary Care Provider: PA TIENT, NO Other Clinician: Referring Provider: Treating Provider/Extender: [name] Evans in Treatment: 4 Subjective Chief Complaint Information obtained from Patient 09/28/2022; left lower extremity wound History of Present Illness (HPI) Admission 09/28/2022 Ms. Yuktha Cullin is a 46 year old female with a past medical history of  venous insufficiency and DVT to the left leg that presents to the clinic for a 5- month history of nonhealing ulcer to the left lower extremity and dorsal foot. She states that her mother had a history of venous insufficiency with wounds as well. She has been using bacitracin to the wound bed. She has chronic pain to the area. She was seen in the ED for this issue on 10/21 and 10/24. She completed a 10-day course of doxycycline and Keflex for this issue. She states she works at [employer] and is on her feet for over 12 hours a day. She is not using compression therapy. She currently denies systemic signs of infection. 11/20; patient presents for follow-up. She has been using Medihoney and Hydrofera Blue to the wound bed. She reports improvement in pain however still has tenderness to the wound sites. She states that she has been elevating her leg and using an Ace bandage. She has been out of work for the past week. She had a PCR culture done at last clinic visit that grew coag negative staph and Candida parapsilosis. Keystone antibiotic ointment has been ordered. 11/27; patient presents for follow-up. She received the [facility] antibiotic ointment and  started this 4 days ago. She states that the wound site is still very tender. She has been using an Ace wrap for compression as she cannot tolerate anything tighter. 12/4; patient presents for follow-up. She has been using Keystone antibiotic with Hydrofera Blue under Ace wrap. She states the chronic pain has improved. She brought in short-term disability papers to be filled out. 12/11; patient presents for follow-up. We have been using Keystone antibiotic ointment and Hydrofera Blue under Kerlix/Coban. This is the first time with a true compression wrap. She tolerated this well. She has no issues or complaints today. She was contacted by vein and vascular today for her venous reflux studies. She states she is going to call them back to schedule these. Patient History Information obtained from Patient, Chart. Family History Unknown History, Cancer - Father, Hypertension - Mother, Lung Disease - Father, Thyroid Problems - Mother,Siblings, No family history of Tuberculosis. Social History Current every day smoker - e-ciggs, Alcohol Use - Never, Drug Use - No History, Caffeine Use - Rarely. Medical History Cardiovascular Patient has history of Peripheral Venous Disease Hospitalization/Surgery History - IVC filter. - tubal ligation. Medical A Surgical History Notes nd Hematologic/Lymphatic DVT Musculoskeletal scoliosis Objective Constitutional respirations regular, non-labored and within target range for patient.. Vitals Time Taken: 2:40 AM, Height: 65 in, Weight: 115 lbs, BMI: 19.1, Temperature: 98.4 F, Pulse: 82 bpm, Respiratory Rate: 20 breaths/min, Blood Pressure: 136/86 mmHg. Cardiovascular 2+ dorsalis pedis/posterior tibialis pulses. Psychiatric pleasant and cooperative. General Notes: Left lower extremity: T the distal medial aspect there is an open wound granulation tissue and scant nonviable tissue. No signs of surrounding o infection. There is epithelization to the left  dorsal foot wound. Decent edema control. Integumentary (Hair, Skin) Wound #1 status is Open. Original cause of wound was Laceration. The date acquired was: 04/16/2022. The wound has been in treatment 4 weeks. The wound is located on the Left,Medial Lower Leg. The wound measures 3.5cm length x 1.8cm width x 0.1cm depth; 4.948cm^2 area and 0.495cm^3 volume. There is Fat Layer (Subcutaneous Tissue) exposed. There is no tunneling or undermining noted. There is a medium amount of serosanguineous drainage noted. The wound [name] Evans, [name] Evans ([id number]) [id number] Page 5 of 7 margin is distinct with the outline attached to the wound base. There is large (67-100%)  red, hyper - granulation within the wound bed. There is a small (1-33%) amount of necrotic tissue within the wound bed including Adherent Slough. The periwound skin appearance exhibited: Scarring, Ecchymosis, Hemosiderin Staining. The periwound skin appearance did not exhibit: Callus, Crepitus, Excoriation, Induration, Rash, Dry/Scaly, Maceration, Atrophie Blanche, Cyanosis, Mottled, Pallor, Rubor, Erythema. Wound #2 status is Healed - Epithelialized. Original cause of wound was Gradually Appeared. The date acquired was: 07/17/2022. The wound has been in treatment 4 weeks. The wound is located on the Left,Dorsal Foot. The wound measures 0cm length x 0cm width x 0cm depth; 0cm^2 area and 0cm^3 volume. There is Fat Layer (Subcutaneous Tissue) exposed. There is no tunneling or undermining noted. There is a none present amount of drainage noted. The wound margin is distinct with the outline attached to the wound base. There is no granulation within the wound bed. There is no necrotic tissue within the wound bed. The periwound skin appearance had no abnormalities noted for moisture. The periwound skin appearance exhibited: Hemosiderin Staining. Periwound temperature was noted as No Abnormality. 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 I recommended continuing the course with Hydrofera Blue and Keystone antibiotic ointment. Since she tolerated Kerlix/Coban well I recommended going up on the compression to 3 layer. She will follow-up to obtain her venous reflux studies. Procedures Wound #1 Pre-procedure diagnosis of Wound #1 is a Venous Leg Ulcer located on the Left,Medial Lower Leg . There was a Three Layer Compression Therapy Procedure by [name] Hammock, [name] Evans. Post procedure Diagnosis Wound #1: Same as Pre-Procedure Plan Follow-up Appointments: Return Appointment in 1 week. - w/ Dr. Heber [place] Return Appointment in 2 weeks. - Dr. Heber [place] Other: - Continue to bring in topical antibiotics at each appt time. Anesthetic: (In clinic) Topical Lidocaine 5% applied to wound bed Bathing/ Shower/ Hygiene: May shower with protection but do not get wound dressing(s) wet. Edema Control - Lymphedema / SCD / Other: Elevate legs to the level of the heart or above for 30 minutes daily and/or when sitting, a frequency of: Avoid standing for long periods of time. WOUND #1: - Lower Leg Wound Laterality: Left, Medial Cleanser: Soap and Water 1 x Per Day/15 Days Discharge Instructions: May shower and wash wound with dial antibacterial soap and water prior to dressing change. Cleanser: Wound Cleanser (Generic) 1 x Per Day/15 Days Discharge Instructions: Cleanse the wound with wound cleanser prior to applying a clean dressing using gauze sponges, not tissue or cotton balls. Topical: Keystone Compounding T opical antibiotics 1 x Per Day/15 Days Discharge Instructions: apply directly to wound bed. Prim Dressing: Hydrofera Blue Ready Foam, 4x5 in (Generic) 1 x Per Day/15 Days ary Discharge  Instructions: Apply over the topical antibiotics. Secondary Dressing: ABD Pad, 5x9 (Generic) 1 x Per Day/15 Days Discharge Instructions: Apply over primary dressing as directed. Secondary Dressing: Woven Gauze Sponge, Non-Sterile 4x4 in (Generic) 1 x Per Day/15 Days Discharge Instructions: Apply over primary dressing as directed. Com pression Wrap: ThreePress (3 layer compression wrap) 1 x Per Day/15 Days Discharge Instructions: Apply three layer compression as directed. 1. Keystone antibiotic with Hydrofera Blue under 3 layer compression 2. Follow-up in 1 week Electronic Signature(s) Signed: 10/26/2022 3:53:46 PM By: [name] Shan DO Entered By: [name] Evans on 10/26/2022 15:22:10 [name] Evans ([id number] Page 6 of 7 --------------------------------------------------------------------------------  HxROS Details Patient Name: Date of Service: [name] Evans, [name] Evans 10/26/2022 2:00 PM Medical Record Number: [id number] Patient Account Number: [id number] Date of Birth/Sex: Treating [name] Evans: Mar 24, 1976 (46 y.o. F) Primary Care Provider: PA [name] Evans, NO Other Clinician: Referring Provider: Treating Provider/Extender: [name] Evans in Treatment: 4 Information Obtained From Patient Chart Hematologic/Lymphatic Medical History: Past Medical History Notes: DVT Cardiovascular Medical History: Positive for: Peripheral Venous Disease Musculoskeletal Medical History: Past Medical History Notes: scoliosis Immunizations Pneumococcal Vaccine: Received Pneumococcal Vaccination: No Implantable Devices None Hospitalization / Surgery History Type of Hospitalization/Surgery IVC filter tubal ligation Family and Social History Unknown History: Yes; Cancer: Yes - Father; Hypertension: Yes - Mother; Lung Disease: Yes - Father; Thyroid Problems: Yes - Mother,Siblings; Tuberculosis: No; Current every day smoker - e-ciggs; Alcohol Use: Never; Drug Use:  No History; Caffeine Use: Rarely; Financial Concerns: No; Food, Clothing or Shelter Needs: No; Support System Lacking: No; Transportation Concerns: No Electronic Signature(s) Signed: 10/26/2022 3:53:46 PM By: [name] Shan DO Entered By: [name] Evans on 10/26/2022 15:21:05 -------------------------------------------------------------------------------- SuperBill Details Patient Name: Date of Service: ANALEISE, HARAKAL 10/26/2022 Medical Record Number: [id number] Patient Account Number: [id number] Date of Birth/Sex: Treating [name] Evans: 06-Jan-1976 (46 y.o. Benjaman Lobe Primary Care Provider: PA [name] Evans, [place] Other Clinician: Referring Provider: Treating Provider/Extender: [name] Evans in Treatment: [place], [name] ([id number] Page 7 of 7 Diagnosis Coding ICD-10 Codes Code Description [phone] Non-pressure chronic ulcer of other part of left lower leg with fat layer exposed I87.312 Chronic venous hypertension (idiopathic) with ulcer of left lower extremity Facility Procedures : CPT4 Code: [id number] Description: (Facility Use Only) [id number] - [place] LWR LT LEG Modifier: Quantity: 1 Physician Procedures : CPT4 Code Description Modifier [id number] - WC PHYS LEVEL 3 - EST PT ICD-10 Diagnosis Description L97.822 Non-pressure chronic ulcer of other part of left lower leg with fat layer exposed I87.312 Chronic venous hypertension (idiopathic) with ulcer  of left lower extremity Quantity: 1 Electronic Signature(s) Signed: 11/23/2022 12:57:48 PM By: Kristine Royal Signed: 01/04/2023 4:03:05 PM By: [name] Shan DO Previous Signature: 10/26/2022 3:53:46 PM Version By: [name] Shan DO Entered By: Kristine Royal on 11/23/2022 12:57:48

## 2022-10-29 NOTE — Progress Notes (Signed)
Evans, Evans ([id number]) [id number] Page 1 of 9 Visit Report for 10/26/2022 Arrival Information Details Patient Name: Date of Service: Evans, Evans 10/26/2022 2:00 PM Medical Record Number: [id number] Patient Account Number: [id number] Date of Birth/Sex: Treating RN: [birth year] (46 y.o. F) Primary Care Dquan Cortopassi: PA TIENT, NO Other Clinician: Referring Giancarlos Berendt: Treating Carlito Bogert/Extender: Tilda Franco in Treatment: 4 Visit Information History Since Last Visit All ordered tests and consults were completed: No Patient Arrived: Ambulatory Added or deleted any medications: No Arrival Time: 14:38 Any new allergies or adverse reactions: No Accompanied By: husband Had a fall or experienced change in No Transfer Assistance: None activities of daily living that may affect Patient Identification Verified: Yes risk of falls: Secondary Verification Process Completed: Yes Signs or symptoms of abuse/neglect since last visito No Patient Requires Transmission-Based Precautions: No Hospitalized since last visit: No Patient Has Alerts: No Implantable device outside of the clinic excluding No cellular tissue based products placed in the center since last visit: Pain Present Now: No Electronic Signature(s) Signed: 10/26/2022 3:52:57 PM By: Dayton Scrape Entered By: Dayton Scrape on 10/26/2022 14:39:33 -------------------------------------------------------------------------------- Compression Therapy Details Patient Name: Date of Service: Evans Evans 10/26/2022 2:00 PM Medical Record Number: [id number] Patient Account Number: [id number] Date of Birth/Sex: Treating RN: Oct 29, 1976 (46 y.o. Toniann Fail Primary Care Hedy Garro: PA Zenovia Jordan, NO Other Clinician: Referring Sravya Grissom: Treating Vear Staton/Extender: Tilda Franco in Treatment: 4 Compression Therapy Performed for Wound Assessment: Wound #1 Left,Medial Lower Leg Performed By:  Clinician Fonnie Mu, RN Compression Type: Three Layer Post Procedure Diagnosis Same as Pre-procedure Electronic Signature(s) Signed: 10/29/2022 5:16:09 PM By: Fonnie Mu RN Entered By: Fonnie Mu on 10/26/2022 15:16:53 Evans, Evans Lank ([id number]) [id number] Page 2 of 9 -------------------------------------------------------------------------------- Encounter Discharge Information Details Patient Name: Date of Service: Evans Evans 10/26/2022 2:00 PM Medical Record Number: [id number] Patient Account Number: [id number] Date of Birth/Sex: Treating RN: [birth year] (46 y.o. [name], Lauren Primary Care Tanis Burnley: PA Zenovia Jordan, NO Other Clinician: Referring Verginia Toohey: Treating Mishon Blubaugh/Extender: Tilda Franco in Treatment: 4 Encounter Discharge Information Items Discharge Condition: Stable Ambulatory Status: Ambulatory Discharge Destination: Home Transportation: Private Auto Accompanied By: self Schedule Follow-up Appointment: Yes Clinical Summary of Care: Patient Declined Electronic Signature(s) Signed: 10/29/2022 5:16:09 PM By: Fonnie Mu RN Entered By: Fonnie Mu on 10/26/2022 15:20:37 -------------------------------------------------------------------------------- Lower Extremity Assessment Details Patient Name: Date of Service: Evans Evans 10/26/2022 2:00 PM Medical Record Number: [id number] Patient Account Number: [id number] Date of Birth/Sex: Treating RN: [birth year] (46 y.o. Evans Evans Primary Care Semaje Kinker: PA Zenovia Jordan, NO Other Clinician: Referring Broly Hatfield: Treating Zianne Schubring/Extender: Tilda Franco in Treatment: 4 Edema Assessment Assessed: [Left: Yes] [Right: No] Edema: [Left: N] [Right: o] Calf Left: Right: Point of Measurement: 28 cm From Medial Instep 31 cm Ankle Left: Right: Point of Measurement: 10 cm From Medial Instep 21 cm Vascular Assessment Pulses: Dorsalis  Pedis Palpable: [Left:Yes] Electronic Signature(s) Signed: 10/26/2022 4:58:54 PM By: Shawn Stall RN, BSN Entered By: Shawn Stall on 10/26/2022 15:09:13 Multi Wound Chart Details -------------------------------------------------------------------------------- Evans Evans ([id number]) [id number] Page 3 of 9 Patient Name: Date of Service: Evans Evans 10/26/2022 2:00 PM Medical Record Number: [id number] Patient Account Number: [id number] Date of Birth/Sex: Treating RN: 1976-05-09 (47 y.o. F) Primary Care Melvin Marmo: PA TIENT, NO Other Clinician: Referring Jacek Colson: Treating Thurman Sarver/Extender: Tilda Franco in Treatment: 4 Vital Signs Height(in): 65 Pulse(bpm): 82 Weight(lbs): 115 Blood Pressure(mmHg): 136/86 Body Mass Index(BMI): 19.1 Temperature(F): 98.4 Respiratory Rate(breaths/min): 20 Wound Assessments Wound Number: 1 2  N/A Photos: N/A Left, Medial Lower Leg Left, Dorsal Foot N/A Wound Location: Laceration Gradually Appeared N/A Wounding Event: Venous Leg Ulcer Venous Leg Ulcer N/A Primary Etiology: Peripheral Venous Disease Peripheral Venous Disease N/A Comorbid History: 04/16/2022 07/17/2022 N/A Date Acquired: 4 4 N/A Weeks of Treatment: Open Healed - Epithelialized N/A Wound Status: No No N/A Wound Recurrence: 3.5x1.8x0.1 0x0x0 N/A Measurements L x W x D (cm) 4.948 0 N/A A (cm) : rea 0.495 0 N/A Volume (cm) : 77.50% 100.00% N/A % Reduction in A rea: 88.70% 100.00% N/A % Reduction in Volume: Full Thickness Without Exposed Full Thickness Without Exposed N/A Classification: Support Structures Support Structures Medium None Present N/A Exudate Amount: Serosanguineous N/A N/A Exudate Type: red, brown N/A N/A Exudate Color: Distinct, outline attached Distinct, outline attached N/A Wound Margin: Large (67-100%) None Present (0%) N/A Granulation Amount: Red, Hyper-granulation N/A N/A Granulation  Quality: Small (1-33%) None Present (0%) N/A Necrotic Amount: Fat Layer (Subcutaneous Tissue): Yes Fat Layer (Subcutaneous Tissue): Yes N/A Exposed Structures: Fascia: No Fascia: No Tendon: No Tendon: No Muscle: No Muscle: No Joint: No Joint: No Bone: No Bone: No Medium (34-66%) Large (67-100%) N/A Epithelialization: Scarring: Yes N/A Periwound Skin Texture: Excoriation: No Induration: No Callus: No Crepitus: No Rash: No Maceration: No No Abnormalities Noted N/A Periwound Skin Moisture: Dry/Scaly: No Ecchymosis: Yes Hemosiderin Staining: Yes N/A Periwound Skin Color: Hemosiderin Staining: Yes Atrophie Blanche: No Cyanosis: No Erythema: No Mottled: No Pallor: No Rubor: No N/A No Abnormality N/A Temperature: Compression Therapy N/A N/A Procedures Performed: Treatment Notes Electronic Signature(s) Signed: 10/26/2022 3:53:46 PM By: Geralyn CorwinHoffman, Jessica DO Entered By: Geralyn CorwinHoffman, Jessica on 10/26/2022 15:17:30 Evans, Evans LankJACQUELINE ([id number]) [id number] Page 4 of 9 -------------------------------------------------------------------------------- Multi-Disciplinary Care Plan Details Patient Name: Date of Service: Lowella PettiesITTS, JA CQ UELINE 10/26/2022 2:00 PM Medical Record Number: [id number] Patient Account Number: [id number] Date of Birth/Sex: Treating RN: 07/23/1976 (46 y.o. Ardis RowanF) Breedlove, Lauren Primary Care Lucus Lambertson: PA Zenovia JordanIENT, NO Other Clinician: Referring Daleyza Gadomski: Treating Enora Trillo/Extender: Tilda FrancoHoffman, Jessica Weeks in Treatment: 4 Active Inactive Wound/Skin Impairment Nursing Diagnoses: Impaired tissue integrity Knowledge deficit related to ulceration/compromised skin integrity Goals: Patient will have a decrease in wound volume by X% from date: (specify in notes) Date Initiated: 09/16/2022 Target Resolution Date: 12/11/2022 Goal Status: Active Patient/caregiver will verbalize understanding of skin care regimen Date Initiated: 09/16/2022 Target  Resolution Date: 12/11/2022 Goal Status: Active Ulcer/skin breakdown will have a volume reduction of 30% by week 4 Date Initiated: 09/16/2022 Target Resolution Date: 10/23/2022 Goal Status: Active Interventions: Assess patient/caregiver ability to obtain necessary supplies Assess patient/caregiver ability to perform ulcer/skin care regimen upon admission and as needed Assess ulceration(s) every visit Notes: Electronic Signature(s) Signed: 10/29/2022 5:16:09 PM By: Fonnie MuBreedlove, Lauren RN Entered By: Fonnie MuBreedlove, Lauren on 10/26/2022 15:14:42 -------------------------------------------------------------------------------- Pain Assessment Details Patient Name: Date of Service: Lowella PettiesITTS, JA CQ UELINE 10/26/2022 2:00 PM Medical Record Number: [id number] Patient Account Number: [id number] Date of Birth/Sex: Treating RN: 11/04/1976 (46 y.o. F) Primary Care Blakelee Allington: PA Zenovia JordanIENT, NO Other Clinician: Referring Ione Sandusky: Treating Darcella Shiffman/Extender: Tilda FrancoHoffman, Jessica Weeks in Treatment: 4 Active Problems Location of Pain Severity and Description of Pain Patient Has Paino No Site Locations [place], Evans LankJACQUELINE ([id number]) [id number] Page 5 of 9 Pain Management and Medication Current Pain Management: Electronic Signature(s) Signed: 10/26/2022 3:52:57 PM By: Dayton Scrapeoss, Aisha Entered By: Dayton Scrapeoss, Aisha on 10/26/2022 14:40:19 -------------------------------------------------------------------------------- Patient/Caregiver Education Details Patient Name: Date of Service: Lowella PettiesITTS, JA CQ UELINE 12/11/2023andnbsp2:00 PM Medical Record Number: [id number] Patient Account Number: [id number] Date of Birth/Gender: Treating RN: 02/23/1976 (46 y.o. F)  Fonnie Mu Primary Care Physician: PA Zenovia Jordan, NO Other Clinician: Referring Physician: Treating Physician/Extender: Tilda Franco in Treatment: 4 Education Assessment Education Provided To: Patient Education Topics Provided Wound/Skin  Impairment: Methods: Explain/Verbal Responses: Reinforcements needed, State content correctly Electronic Signature(s) Signed: 10/29/2022 5:16:09 PM By: Fonnie Mu RN Entered By: Fonnie Mu on 10/26/2022 15:15:06 -------------------------------------------------------------------------------- Wound Assessment Details Patient Name: Date of Service: SHALAY, CARDER 10/26/2022 2:00 PM Medical Record Number: [id number] Patient Account Number: [id number] Date of Birth/Sex: Treating RN: Feb 04, 1976 (46 y.o. F) Primary Care Embry Manrique: PA Zenovia Jordan, NO Other Clinician: Referring Takeisha Cianci: Treating Armand Preast/Extender: Karnisha, Lefebre, Evans Lank ([id number]) [id number] Page 6 of 9 Weeks in Treatment: 4 Wound Status Wound Number: 1 Primary Etiology: Venous Leg Ulcer Wound Location: Left, Medial Lower Leg Wound Status: Open Wounding Event: Laceration Comorbid History: Peripheral Venous Disease Date Acquired: 04/16/2022 Weeks Of Treatment: 4 Clustered Wound: No Photos Wound Measurements Length: (cm) 3.5 Width: (cm) 1.8 Depth: (cm) 0.1 Area: (cm) 4.948 Volume: (cm) 0.495 % Reduction in Area: 77.5% % Reduction in Volume: 88.7% Epithelialization: Medium (34-66%) Tunneling: No Undermining: No Wound Description Classification: Full Thickness Without Exposed Support Structures Wound Margin: Distinct, outline attached Exudate Amount: Medium Exudate Type: Serosanguineous Exudate Color: red, brown Foul Odor After Cleansing: No Slough/Fibrino Yes Wound Bed Granulation Amount: Large (67-100%) Exposed Structure Granulation Quality: Red, Hyper-granulation Fascia Exposed: No Necrotic Amount: Small (1-33%) Fat Layer (Subcutaneous Tissue) Exposed: Yes Necrotic Quality: Adherent Slough Tendon Exposed: No Muscle Exposed: No Joint Exposed: No Bone Exposed: No Periwound Skin Texture Texture Color No Abnormalities Noted: No No Abnormalities Noted:  No Callus: No Atrophie Blanche: No Crepitus: No Cyanosis: No Excoriation: No Ecchymosis: Yes Induration: No Erythema: No Rash: No Hemosiderin Staining: Yes Scarring: Yes Mottled: No Pallor: No Moisture Rubor: No No Abnormalities Noted: No Dry / Scaly: No Maceration: No Treatment Notes Wound #1 (Lower Leg) Wound Laterality: Left, Medial Cleanser Soap and Water Discharge Instruction: May shower and wash wound with dial antibacterial soap and water prior to dressing change. Wound Cleanser Discharge Instruction: Cleanse the wound with wound cleanser prior to applying a clean dressing using gauze sponges, not tissue or cotton balls. Peri-Wound Care Evans, Evans ([id number]) [id number] Page 7 of 9 Topical Keystone Compounding T opical antibiotics Discharge Instruction: apply directly to wound bed. Primary Dressing Hydrofera Blue Ready Foam, 4x5 in Discharge Instruction: Apply over the topical antibiotics. Secondary Dressing ABD Pad, 5x9 Discharge Instruction: Apply over primary dressing as directed. Woven Gauze Sponge, Non-Sterile 4x4 in Discharge Instruction: Apply over primary dressing as directed. Secured With Compression Wrap ThreePress (3 layer compression wrap) Discharge Instruction: Apply three layer compression as directed. Compression Stockings Add-Ons Electronic Signature(s) Signed: 10/26/2022 4:58:54 PM By: Shawn Stall RN, BSN Entered By: Shawn Stall on 10/26/2022 15:09:50 -------------------------------------------------------------------------------- Wound Assessment Details Patient Name: Date of Service: Evans, Evans 10/26/2022 2:00 PM Medical Record Number: [id number] Patient Account Number: [id number] Date of Birth/Sex: Treating RN: 01-Jul-1976 (46 y.o. [name], Lauren Primary Care Shondell Fabel: PA Zenovia Jordan, NO Other Clinician: Referring Quentin Shorey: Treating Lamoyne Palencia/Extender: Tilda Franco in Treatment:  4 Wound Status Wound Number: 2 Primary Etiology: Venous Leg Ulcer Wound Location: Left, Dorsal Foot Wound Status: Healed - Epithelialized Wounding Event: Gradually Appeared Comorbid History: Peripheral Venous Disease Date Acquired: 07/17/2022 Weeks Of Treatment: 4 Clustered Wound: No Photos Wound Measurements Length: (cm) Width: (cm) Depth: (cm) Area: (cm) Volume: (cm) Evans, Evans ([id number]) Wound Description Classification: Full Thickness Without Exposed Support Wound Margin: Distinct, outline attached Exudate Amount: None Present Structures  Foul Odor After Cleansing: Slough/Fibrino 0 % Reduction in Area: 100% 0 % Reduction in Volume: 100% 0 Epithelialization: Large (67-100%) 0 Tunneling: No 0 Undermining: No [id number] Page 8 of 9 No Yes Wound Bed Granulation Amount: None Present (0%) Exposed Structure Necrotic Amount: None Present (0%) Fascia Exposed: No Fat Layer (Subcutaneous Tissue) Exposed: Yes Tendon Exposed: No Muscle Exposed: No Joint Exposed: No Bone Exposed: No Periwound Skin Texture Texture Color No Abnormalities Noted: No No Abnormalities Noted: No Hemosiderin Staining: Yes Moisture No Abnormalities Noted: Yes Temperature / Pain Temperature: No Abnormality Treatment Notes Wound #2 (Foot) Wound Laterality: Dorsal, Left Cleanser Peri-Wound Care Topical Primary Dressing Secondary Dressing Secured With Compression Wrap Compression Stockings Add-Ons Electronic Signature(s) Signed: 10/29/2022 5:16:09 PM By: Fonnie Mu RN Entered By: Fonnie Mu on 10/26/2022 15:14:18 -------------------------------------------------------------------------------- Vitals Details Patient Name: Date of Service: Lowella Petties 10/26/2022 2:00 PM Medical Record Number: [id number] Patient Account Number: [id number] Date of Birth/Sex: Treating RN: 1976-05-03 (46 y.o. F) Primary Care Barclay Lennox: PA TIENT, NO Other  Clinician: Referring Shaneil Yazdi: Treating Bocephus Cali/Extender: Tilda Franco in Treatment: 4 Vital Signs Time Taken: 02:40 Temperature (F): 98.4 Height (in): 65 Pulse (bpm): 82 Weight (lbs): 115 Respiratory Rate (breaths/min): 20 Body Mass Index (BMI): 19.1 Blood Pressure (mmHg): 136/86 Reference Range: 80 - 120 mg / dl Electronic Signature(s) Signed: 10/26/2022 3:52:57 PM By: Dayton Scrape Entered By: Dayton Scrape on 10/26/2022 14:40:12 Dancel, Evans Lank ([id number]) [id number] Page 9 of 9

## 2022-11-02 ENCOUNTER — Encounter (HOSPITAL_BASED_OUTPATIENT_CLINIC_OR_DEPARTMENT_OTHER): Payer: BC Managed Care – PPO | Admitting: Internal Medicine

## 2022-11-02 DIAGNOSIS — L97822 Non-pressure chronic ulcer of other part of left lower leg with fat layer exposed: Secondary | ICD-10-CM | POA: Diagnosis not present

## 2022-11-02 DIAGNOSIS — I87312 Chronic venous hypertension (idiopathic) with ulcer of left lower extremity: Secondary | ICD-10-CM | POA: Diagnosis not present

## 2022-11-02 NOTE — Progress Notes (Signed)
RILY, NICKEY ([id number]) [id number] Page 1 of 7 Visit Report for 11/02/2022 Chief Complaint Document Details Patient Name: Date of Service: [name] Evans, [name] Evans 11/02/2022 8:00 A M Medical Record Number: [id number] Patient Account Number: [id number] Date of Birth/Sex: Treating RN: 06-01-1976 (46 y.o. F) Primary Care Provider: PA Zenovia Jordan, NO Other Clinician: Referring Provider: Treating Provider/Extender: Tilda Franco in Treatment: 5 Information Obtained from: Patient Chief Complaint 09/28/2022; left lower extremity wound Electronic Signature(s) Signed: 11/02/2022 9:03:52 AM By: Geralyn Corwin DO Entered By: Geralyn Corwin on 11/02/2022 09:00:52 -------------------------------------------------------------------------------- HPI Details Patient Name: Date of Service: [name] Evans, [name] Evans 11/02/2022 8:00 A M Medical Record Number: [id number] Patient Account Number: [id number] Date of Birth/Sex: Treating RN: 1976-11-12 (46 y.o. F) Primary Care Provider: PA Zenovia Jordan, NO Other Clinician: Referring Provider: Treating Provider/Extender: Tilda Franco in Treatment: 5 History of Present Illness HPI Description: Admission 09/28/2022 Ms. 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 She states that her mother had a history of venous insufficiency with wounds as well. She has been using bacitracin to the wound bed. She has chronic pain to the area. She was seen in the ED for this issue on 10/21 and 10/24. She completed a 10-day course of doxycycline and Keflex for this issue. She states she works at [employer] and is on her feet for over 12 hours a day. She is not using compression therapy. She currently denies systemic signs of infection. 11/20; patient presents for  follow-up. She has been using Medihoney and Hydrofera Blue to the wound bed. She reports improvement in pain however still has tenderness to the wound sites. She states that she has been elevating her leg and using an Ace bandage. She has been out of work for the past week. She had a PCR culture done at last clinic visit that grew coag negative staph and Candida parapsilosis. Keystone antibiotic ointment has been ordered. 11/27; patient presents for follow-up. She received the [facility] antibiotic ointment and started this 4 days ago. She states that the wound site is still very tender. She has been using an Ace wrap for compression as she cannot tolerate anything tighter. 12/4; patient presents for follow-up. She has been using Keystone antibiotic with Hydrofera Blue under Ace wrap. She states the chronic pain has improved. She brought in short-term disability papers to be filled out. 12/11; patient presents for follow-up. We have been using Keystone antibiotic ointment and Hydrofera Blue under Kerlix/Coban. This is the first time with a true compression wrap. She tolerated this well. She has no issues or complaints today. She was contacted by vein and vascular today for her venous reflux studies. She states she is going to call them back to schedule these. 12/18; patient presents for follow-up. We have been using Keystone antibiotic and Hydrofera Blue under 3 layer compression. She tolerated the wrap well. She has no issues or complaints today. She states she has scheduled her venous reflux studies. Electronic Signature(s) Signed: 11/02/2022 9:03:52 AM By: Geralyn Corwin DO Entered By: Geralyn Corwin on 11/02/2022 09:01:31 Mercadel, [name] Evans ([id number]) [id number] Page 2 of 7 -------------------------------------------------------------------------------- Physical Exam Details Patient Name: Date of Service: [name] Evans, [name] Evans 11/02/2022 8:00 A M Medical Record  Number: [id number] Patient Account Number: [id number] Date of Birth/Sex: Treating RN: 05/07/1976 (46 y.o. F) Primary Care Provider: PA  Zenovia Jordan, NO Other Clinician: Referring Provider: Treating Provider/Extender: Tilda Franco in Treatment: 5 Constitutional respirations regular, non-labored and within target range for patient.. Cardiovascular 2+ dorsalis pedis/posterior tibialis pulses. Psychiatric pleasant and cooperative. Notes Left lower extremity: T the distal medial aspect there is an open wound granulation tissue and scant nonviable tissue. No signs of surrounding infection. o Hemosiderin staining. Electronic Signature(s) Signed: 11/02/2022 9:03:52 AM By: Geralyn Corwin DO Entered By: Geralyn Corwin on 11/02/2022 09:02:15 -------------------------------------------------------------------------------- Physician Orders Details Patient Name: Date of Service: [name] Evans, [name] Evans 11/02/2022 8:00 A M Medical Record Number: [id number] Patient Account Number: [id number] Date of Birth/Sex: Treating RN: 07/21/1976 (46 y.o. [name], Lauren Primary Care Provider: PA Zenovia Jordan, NO Other Clinician: Referring Provider: Treating Provider/Extender: Tilda Franco in Treatment: 5 Verbal / Phone Orders: No Diagnosis Coding Follow-up Appointments ppointment in 1 week. - w/ Dr. Mikey Bussing Return A ppointment in 2 weeks. - Dr. Mikey Bussing Return A Other: - Continue to bring in topical antibiotics at each appt time. Anesthetic (In clinic) Topical Lidocaine 5% applied to wound bed Bathing/ Shower/ Hygiene May shower with protection but do not get wound dressing(s) wet. Edema Control - Lymphedema / SCD / Other Elevate legs to the level of the heart or above for 30 minutes daily and/or when sitting, a frequency of: Avoid standing for long periods of time. Wound Treatment Wound #1 - Lower Leg Wound Laterality: Left, Medial Cleanser: Soap and Water 1 x Per Day/15 Days Discharge  Instructions: May shower and wash wound with dial antibacterial soap and water prior to dressing change. Cleanser: Wound Cleanser (Generic) 1 x Per Day/15 Days Discharge Instructions: Cleanse the wound with wound cleanser prior to applying a clean dressing using gauze sponges, not tissue or cotton balls. [name] Evans, [name] Evans ([id number]) [id number] Page 3 of 7 Topical: Keystone Compounding T opical antibiotics 1 x Per Day/15 Days Discharge Instructions: apply directly to wound bed. Prim Dressing: Hydrofera Blue Ready Foam, 4x5 in (Generic) 1 x Per Day/15 Days ary Discharge Instructions: Apply over the topical antibiotics. Secondary Dressing: ABD Pad, 5x9 (Generic) 1 x Per Day/15 Days Discharge Instructions: Apply over primary dressing as directed. Secondary Dressing: Woven Gauze Sponge, Non-Sterile 4x4 in (Generic) 1 x Per Day/15 Days Discharge Instructions: Apply over primary dressing as directed. Compression Wrap: ThreePress (3 layer compression wrap) 1 x Per Day/15 Days Discharge Instructions: Apply three layer compression as directed. Electronic Signature(s) Signed: 11/02/2022 9:03:52 AM By: Geralyn Corwin DO Entered By: Geralyn Corwin on 11/02/2022 09:02:21 -------------------------------------------------------------------------------- Problem List Details Patient Name: Date of Service: [name] Evans, [name] Evans 11/02/2022 8:00 A M Medical Record Number: [id number] Patient Account Number: [id number] Date of Birth/Sex: Treating RN: Feb 01, 1976 (46 y.o. F) Primary Care Provider: PA Zenovia Jordan, NO Other Clinician: Referring Provider: Treating Provider/Extender: Tilda Franco in Treatment: 5 Active Problems ICD-10 Encounter Code Description Active Date MDM Diagnosis L97.822 Non-pressure chronic ulcer of other part of left lower leg with fat layer 09/28/2022 No Yes exposed I87.312 Chronic venous hypertension (idiopathic) with ulcer of left lower extremity  09/28/2022 No Yes Inactive Problems Resolved Problems Electronic Signature(s) Signed: 11/02/2022 9:03:52 AM By: Geralyn Corwin DO Entered By: Geralyn Corwin on 11/02/2022 09:00:39 -------------------------------------------------------------------------------- Progress Note Details Patient Name: Date of Service: [name] Evans 11/02/2022 8:00 A M Medical Record Number: [id number] Patient Account Number: [id number] [name] Evans, [name] Evans ([id number]) [phone].pdf Page 4 of 7 Date of Birth/Sex: Treating RN: 10/27/1976 (46 y.o. F) Primary Care Provider: PA Zenovia Jordan, NO Other Clinician: Referring Provider: Treating Provider/Extender: Tilda Franco  in Treatment: 5 Subjective Chief Complaint Information obtained from Patient 09/28/2022; left lower extremity wound History of Present Illness (HPI) Admission 09/28/2022 Ms. 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 She states that her mother had a history of venous insufficiency with wounds as well. She has been using bacitracin to the wound bed. She has chronic pain to the area. She was seen in the ED for this issue on 10/21 and 10/24. She completed a 10-day course of doxycycline and Keflex for this issue. She states she works at [employer] and is on her feet for over 12 hours a day. She is not using compression therapy. She currently denies systemic signs of infection. 11/20; patient presents for follow-up. She has been using Medihoney and Hydrofera Blue to the wound bed. She reports improvement in pain however still has tenderness to the wound sites. She states that she has been elevating her leg and using an Ace bandage. She has been out of work for the past week. She had a PCR culture done at last clinic visit that grew coag  negative staph and Candida parapsilosis. Keystone antibiotic ointment has been ordered. 11/27; patient presents for follow-up. She received the [facility] antibiotic ointment and started this 4 days ago. She states that the wound site is still very tender. She has been using an Ace wrap for compression as she cannot tolerate anything tighter. 12/4; patient presents for follow-up. She has been using Keystone antibiotic with Hydrofera Blue under Ace wrap. She states the chronic pain has improved. She brought in short-term disability papers to be filled out. 12/11; patient presents for follow-up. We have been using Keystone antibiotic ointment and Hydrofera Blue under Kerlix/Coban. This is the first time with a true compression wrap. She tolerated this well. She has no issues or complaints today. She was contacted by vein and vascular today for her venous reflux studies. She states she is going to call them back to schedule these. 12/18; patient presents for follow-up. We have been using Keystone antibiotic and Hydrofera Blue under 3 layer compression. She tolerated the wrap well. She has no issues or complaints today. She states she has scheduled her venous reflux studies. Patient History Information obtained from Patient, Chart. Family History Unknown History, Cancer - Father, Hypertension - Mother, Lung Disease - Father, Thyroid Problems - Mother,Siblings, No family history of Tuberculosis. Social History Current every day smoker - e-ciggs, Alcohol Use - Never, Drug Use - No History, Caffeine Use - Rarely. Medical History Cardiovascular Patient has history of Peripheral Venous Disease Hospitalization/Surgery History - IVC filter. - tubal ligation. Medical A Surgical History Notes nd Hematologic/Lymphatic DVT Musculoskeletal scoliosis Objective Constitutional respirations regular, non-labored and within target range for patient.. Vitals Time Taken: 8:08 AM, Height: 65 in, Weight: 115 lbs,  BMI: 19.1, Temperature: 98.8 F, Pulse: 99 bpm, Respiratory Rate: 18 breaths/min, Blood Pressure: 136/89 mmHg. Cardiovascular 2+ dorsalis pedis/posterior tibialis pulses. Psychiatric pleasant and cooperative. General Notes: Left lower extremity: T the distal medial aspect there is an open wound granulation tissue and scant nonviable tissue. No signs of surrounding o infection. Hemosiderin staining. [name] Evans, [name] Evans ([id number]) [id number] Page 5 of 7 Integumentary (Hair, Skin) Wound #1 status is Open. Original cause of wound was Laceration. The date acquired was: 04/16/2022. The wound has been in treatment 5 weeks. The  wound is located on the Left,Medial Lower Leg. The wound measures 3.3cm length x 1.7cm width x 0.1cm depth; 4.406cm^2 area and 0.441cm^3 volume. There is Fat Layer (Subcutaneous Tissue) exposed. There is no tunneling or undermining noted. There is a medium amount of serosanguineous drainage noted. The wound margin is distinct with the outline attached to the wound base. There is large (67-100%) red, hyper - granulation within the wound bed. There is a small (1-33%) amount of necrotic tissue within the wound bed including Adherent Slough. The periwound skin appearance exhibited: Scarring, Ecchymosis, Hemosiderin Staining. The periwound skin appearance did not exhibit: Callus, Crepitus, Excoriation, Induration, Rash, Dry/Scaly, Maceration, Atrophie Blanche, Cyanosis, Mottled, Pallor, Rubor, Erythema. Assessment Active Problems ICD-10 Non-pressure chronic ulcer of other part of left lower leg with fat layer exposed Chronic venous hypertension (idiopathic) with ulcer of left lower extremity Patient's wound has shown improvement in size in appearance since last clinic visit. Since she tolerated the 3 layer compression well I recommended going up to 4 layer compression. Continue Keystone antibiotic and Hydrofera Blue. Follow-up in 1  week. Procedures Wound #1 Pre-procedure diagnosis of Wound #1 is a Venous Leg Ulcer located on the Left,Medial Lower Leg . There was a Three Layer Compression Therapy Procedure by Fonnie Mu, RN. Post procedure Diagnosis Wound #1: Same as Pre-Procedure Plan Follow-up Appointments: Return Appointment in 1 week. - w/ Dr. Mikey Bussing Return Appointment in 2 weeks. - Dr. Mikey Bussing Other: - Continue to bring in topical antibiotics at each appt time. Anesthetic: (In clinic) Topical Lidocaine 5% applied to wound bed Bathing/ Shower/ Hygiene: May shower with protection but do not get wound dressing(s) wet. Edema Control - Lymphedema / SCD / Other: Elevate legs to the level of the heart or above for 30 minutes daily and/or when sitting, a frequency of: Avoid standing for long periods of time. WOUND #1: - Lower Leg Wound Laterality: Left, Medial Cleanser: Soap and Water 1 x Per Day/15 Days Discharge Instructions: May shower and wash wound with dial antibacterial soap and water prior to dressing change. Cleanser: Wound Cleanser (Generic) 1 x Per Day/15 Days Discharge Instructions: Cleanse the wound with wound cleanser prior to applying a clean dressing using gauze sponges, not tissue or cotton balls. Topical: Keystone Compounding T opical antibiotics 1 x Per Day/15 Days Discharge Instructions: apply directly to wound bed. Prim Dressing: Hydrofera Blue Ready Foam, 4x5 in (Generic) 1 x Per Day/15 Days ary Discharge Instructions: Apply over the topical antibiotics. Secondary Dressing: ABD Pad, 5x9 (Generic) 1 x Per Day/15 Days Discharge Instructions: Apply over primary dressing as directed. Secondary Dressing: Woven Gauze Sponge, Non-Sterile 4x4 in (Generic) 1 x Per Day/15 Days Discharge Instructions: Apply over primary dressing as directed. Com pression Wrap: ThreePress (3 layer compression wrap) 1 x Per Day/15 Days Discharge Instructions: Apply three layer compression as directed. 1. Hydrofera  Blue with Keystone antibiotic under 4-layer compressionooleft lower extremity 2. Follow-up in 1 week Electronic Signature(s) Signed: 11/02/2022 9:03:52 AM By: Geralyn Corwin DO Entered By: Geralyn Corwin on 11/02/2022 09:03:04 [name] ([id number]) [id number] Page 6 of 7 -------------------------------------------------------------------------------- HxROS Details Patient Name: Date of Service: [name] Evans, [name] Evans 11/02/2022 8:00 A M Medical Record Number: [id number] Patient Account Number: [id number] Date of Birth/Sex: Treating RN: Feb 15, 1976 (46 y.o. F) Primary Care Provider: PA Zenovia Jordan, NO Other Clinician: Referring Provider: Treating Provider/Extender: Tilda Franco in Treatment: 5 Information Obtained From Patient Chart Hematologic/Lymphatic Medical History: Past Medical History Notes: DVT Cardiovascular Medical History: Positive for: Peripheral Venous Disease Musculoskeletal  Medical History: Past Medical History Notes: scoliosis Immunizations Pneumococcal Vaccine: Received Pneumococcal Vaccination: No Implantable Devices None Hospitalization / Surgery History Type of Hospitalization/Surgery IVC filter tubal ligation Family and Social History Unknown History: Yes; Cancer: Yes - Father; Hypertension: Yes - Mother; Lung Disease: Yes - Father; Thyroid Problems: Yes - Mother,Siblings; Tuberculosis: No; Current every day smoker - e-ciggs; Alcohol Use: Never; Drug Use: No History; Caffeine Use: Rarely; Financial Concerns: No; Food, Clothing or Shelter Needs: No; Support System Lacking: No; Transportation Concerns: No Electronic Signature(s) Signed: 11/02/2022 9:03:52 AM By: Geralyn CorwinHoffman, Ismeal Heider DO Entered By: Geralyn CorwinHoffman, Darshawn Boateng on 11/02/2022 09:01:42 -------------------------------------------------------------------------------- SuperBill Details Patient Name: Date of Service: [name] PettiesITTS, [name] Evans 11/02/2022 Medical Record  Number: [id number] Patient Account Number: [id number] Date of Birth/Sex: Treating RN: 07/21/1976 (46 y.o. Ardis RowanF) [name] Evans, Lauren Primary Care Provider: PA Zenovia JordanIENT, [place] Other Clinician: Referring Provider: Treating Provider/Extender: Tilda FrancoHoffman, Christien Frankl Weeks in Treatment: 5 [name] Evans, [name] LankJACQUELINE ([id number]) [id number] Page 7 of 7 Diagnosis Coding ICD-10 Codes Code Description [phone] Non-pressure chronic ulcer of other part of left lower leg with fat layer exposed I87.312 Chronic venous hypertension (idiopathic) with ulcer of left lower extremity Facility Procedures : CPT4 Code: [id number] Description: (Facility Use Only) 29581LT - APPLY MULTLAY COMPRS LWR LT LEG Modifier: Quantity: 1 Physician Procedures : CPT4 Code Description Modifier [id number] - WC PHYS LEVEL 3 - EST PT ICD-10 Diagnosis Description L97.822 Non-pressure chronic ulcer of other part of left lower leg with fat layer exposed I87.312 Chronic venous hypertension (idiopathic) with ulcer  of left lower extremity Quantity: 1 Electronic Signature(s) Signed: 11/02/2022 9:03:52 AM By: Geralyn CorwinHoffman, Braelynne Garinger DO Entered By: Geralyn CorwinHoffman, Daymion Nazaire on 11/02/2022 09:03:17

## 2022-11-05 NOTE — Progress Notes (Signed)
LACHRISHA, ZIEBARTH ([id number]) [id number] Page 1 of 6 Visit Report for 11/02/2022 Arrival Information Details Patient Name: Date of Service: [name] Evans, [name] Evans 11/02/2022 8:00 A M Medical Record Number: [id number] Patient Account Number: [id number] Date of Birth/Sex: Treating RN: Apr 09, 1976 (46 y.o. Orville Govern Primary Care Mar Zettler: PA Zenovia Jordan, NO Other Clinician: Referring Alondra Sahni: Treating Rose Hippler/Extender: Tilda Franco in Treatment: 5 Visit Information History Since Last Visit Added or deleted any medications: No Patient Arrived: Ambulatory Any new allergies or adverse reactions: No Arrival Time: 08:07 Had a fall or experienced change in No Accompanied By: Self activities of daily living that may affect Transfer Assistance: None risk of falls: Patient Identification Verified: Yes Signs or symptoms of abuse/neglect since last visito No Secondary Verification Process Completed: Yes Hospitalized since last visit: No Patient Requires Transmission-Based Precautions: No Implantable device outside of the clinic excluding No Patient Has Alerts: No cellular tissue based products placed in the center since last visit: Has Dressing in Place as Prescribed: Yes Has Compression in Place as Prescribed: Yes Pain Present Now: No Electronic Signature(s) Signed: 11/02/2022 3:55:49 PM By: Redmond Pulling RN, BSN Entered By: Redmond Pulling on 11/02/2022 08:08:20 -------------------------------------------------------------------------------- Compression Therapy Details Patient Name: Date of Service: [name] Evans, [name] Evans 11/02/2022 8:00 A M Medical Record Number: [id number] Patient Account Number: [id number] Date of Birth/Sex: Treating RN: [birth year] (46 y.o. Toniann Fail Primary Care Jackqulyn Mendel: PA Zenovia Jordan, NO Other Clinician: Referring Mercede Rollo: Treating Summer Mccolgan/Extender: Tilda Franco in Treatment: 5 Compression Therapy Performed for  Wound Assessment: Wound #1 Left,Medial Lower Leg Performed By: Clinician Fonnie Mu, RN Compression Type: Three Layer Post Procedure Diagnosis Same as Pre-procedure Electronic Signature(s) Signed: 11/04/2022 5:39:23 PM By: Fonnie Mu RN Entered By: Fonnie Mu on 11/02/2022 08:37:46 Feigenbaum, [name] Evans ([id number]) [id number] Page 2 of 6 -------------------------------------------------------------------------------- Lower Extremity Assessment Details Patient Name: Date of Service: [name] Evans, [name] Evans 11/02/2022 8:00 A M Medical Record Number: [id number] Patient Account Number: [id number] Date of Birth/Sex: Treating RN: 11-06-1976 (46 y.o. Orville Govern Primary Care Maiko Salais: PA Zenovia Jordan, NO Other Clinician: Referring Nasreen Goedecke: Treating Tarrance Januszewski/Extender: Tilda Franco in Treatment: 5 Edema Assessment Assessed: [Left: No] [Right: No] Edema: [Left: N] [Right: o] Calf Left: Right: Point of Measurement: 28 cm From Medial Instep 31 cm Ankle Left: Right: Point of Measurement: 10 cm From Medial Instep 21 cm Vascular Assessment Pulses: Dorsalis Pedis Palpable: [Left:Yes] Electronic Signature(s) Signed: 11/02/2022 3:55:49 PM By: Redmond Pulling RN, BSN Entered By: Redmond Pulling on 11/02/2022 08:14:34 -------------------------------------------------------------------------------- Multi Wound Chart Details Patient Name: Date of Service: [name] Evans 11/02/2022 8:00 A M Medical Record Number: [id number] Patient Account Number: [id number] Date of Birth/Sex: Treating RN: [birth year] (46 y.o. F) Primary Care Aamari West: PA [name] Evans, NO Other Clinician: Referring Ara Mano: Treating Sejla Marzano/Extender: Tilda Franco in Treatment: 5 Vital Signs Height(in): 65 Pulse(bpm): 99 Weight(lbs): 115 Blood Pressure(mmHg): 136/89 Body Mass Index(BMI): 19.1 Temperature(F): 98.8 Respiratory Rate(breaths/min): 18 [1:Photos:]  [N/A:N/A] Left, Medial Lower Leg N/A N/A Wound Location: Laceration N/A N/A Wounding Event: Venous Leg Ulcer N/A N/A Primary Etiology: Peripheral Venous Disease N/A N/A Comorbid History: 04/16/2022 N/A N/A Date AcquiredJUANA, [name] Evans ([id number]) [id number] Page 3 of 6 5 N/A N/A Weeks of Treatment: Open N/A N/A Wound Status: No N/A N/A Wound Recurrence: 3.3x1.7x0.1 N/A N/A Measurements L x W x D (cm) 4.406 N/A N/A A (cm) : rea 0.441 N/A N/A Volume (cm) : 80.00% N/A N/A % Reduction in Area: 90.00% N/A N/A % Reduction in Volume: Full Thickness Without Exposed N/A  N/A Classification: Support Structures Medium N/A N/A Exudate Amount: Serosanguineous N/A N/A Exudate Type: red, brown N/A N/A Exudate Color: Distinct, outline attached N/A N/A Wound Margin: Large (67-100%) N/A N/A Granulation Amount: Red, Hyper-granulation N/A N/A Granulation Quality: Small (1-33%) N/A N/A Necrotic Amount: Fat Layer (Subcutaneous Tissue): Yes N/A N/A Exposed Structures: Fascia: No Tendon: No Muscle: No Joint: No Bone: No Medium (34-66%) N/A N/A Epithelialization: Scarring: Yes N/A N/A Periwound Skin Texture: Excoriation: No Induration: No Callus: No Crepitus: No Rash: No Maceration: No N/A N/A Periwound Skin Moisture: Dry/Scaly: No Ecchymosis: Yes N/A N/A Periwound Skin Color: Hemosiderin Staining: Yes Atrophie Blanche: No Cyanosis: No Erythema: No Mottled: No Pallor: No Rubor: No Compression Therapy N/A N/A Procedures Performed: Treatment Notes Electronic Signature(s) Signed: 11/02/2022 9:03:52 AM By: Geralyn Corwin DO Entered By: Geralyn Corwin on 11/02/2022 09:00:44 -------------------------------------------------------------------------------- Multi-Disciplinary Care Plan Details Patient Name: Date of Service: [name] Evans, [name] Evans 11/02/2022 8:00 A M Medical Record Number: [id number] Patient Account Number:  [id number] Date of Birth/Sex: Treating RN: 1976-04-13 (46 y.o. [name], Lauren Primary Care Yanelis Osika: PA Zenovia Jordan, NO Other Clinician: Referring Hazelyn Kallen: Treating Adnan Vanvoorhis/Extender: Tilda Franco in Treatment: 5 Active Inactive Wound/Skin Impairment Nursing Diagnoses: Impaired tissue integrity Knowledge deficit related to ulceration/compromised skin integrity Goals: Patient will have a decrease in wound volume by X% from date: (specify in notes) Date Initiated: 09/16/2022 Target Resolution Date: 12/11/2022 Goal Status: Active Patient/caregiver will verbalize understanding of skin care regimen Remlinger, [name] Evans ([id number]) [id number] Page 4 of 6 Date Initiated: 09/16/2022 Target Resolution Date: 12/11/2022 Goal Status: Active Ulcer/skin breakdown will have a volume reduction of 30% by week 4 Date Initiated: 09/16/2022 Target Resolution Date: 11/14/2022 Goal Status: Active Interventions: Assess patient/caregiver ability to obtain necessary supplies Assess patient/caregiver ability to perform ulcer/skin care regimen upon admission and as needed Assess ulceration(s) every visit Notes: Electronic Signature(s) Signed: 11/04/2022 5:39:23 PM By: Fonnie Mu RN Entered By: Fonnie Mu on 11/02/2022 08:29:17 -------------------------------------------------------------------------------- Pain Assessment Details Patient Name: Date of Service: [name] Evans, [name] Evans 11/02/2022 8:00 A M Medical Record Number: [id number] Patient Account Number: [id number] Date of Birth/Sex: Treating RN: [birth year] (46 y.o. Orville Govern Primary Care Cindy Fullman: PA Zenovia Jordan, NO Other Clinician: Referring Luciano Cinquemani: Treating Jamoni Hewes/Extender: Tilda Franco in Treatment: 5 Active Problems Location of Pain Severity and Description of Pain Patient Has Paino No Site Locations Pain Management and Medication Current Pain Management: Electronic  Signature(s) Signed: 11/02/2022 3:55:49 PM By: Redmond Pulling RN, BSN Entered By: Redmond Pulling on 11/02/2022 08:08:56 Patient/Caregiver Education Details -------------------------------------------------------------------------------- [name] Evans ([id number]) [id number] Page 5 of 6 Patient Name: Date of Service: [name] Evans, [name] Evans 12/18/2023andnbsp8:00 A M Medical Record Number: [id number] Patient Account Number: [id number] Date of Birth/Gender: Treating RN: [birth year] (46 y.o. Toniann Fail Primary Care Physician: PA Zenovia Jordan, NO Other Clinician: Referring Physician: Treating Physician/Extender: Tilda Franco in Treatment: 5 Education Assessment Education Provided To: Patient Education Topics Provided Wound/Skin Impairment: Methods: Explain/Verbal Responses: State content correctly Electronic Signature(s) Signed: 11/04/2022 5:39:23 PM By: Fonnie Mu RN Entered By: Fonnie Mu on 11/02/2022 08:29:30 -------------------------------------------------------------------------------- Wound Assessment Details Patient Name: Date of Service: [name] Evans, [name] Evans 11/02/2022 8:00 A M Medical Record Number: [id number] Patient Account Number: [id number] Date of Birth/Sex: Treating RN: April 20, 1976 (46 y.o. Orville Govern Primary Care Taylor Spilde: PA Zenovia Jordan, NO Other Clinician: Referring Rechelle Niebla: Treating Shyanne Mcclary/Extender: Tilda Franco in Treatment: 5 Wound Status Wound Number: 1 Primary Etiology: Venous Leg Ulcer Wound Location: Left, Medial Lower Leg Wound Status: Open Wounding Event: Laceration Comorbid  History: Peripheral Venous Disease Date Acquired: 04/16/2022 Weeks Of Treatment: 5 Clustered Wound: No Photos Wound Measurements Length: (cm) 3.3 Width: (cm) 1.7 Depth: (cm) 0.1 Area: (cm) 4.406 Volume: (cm) 0.441 % Reduction in Area: 80% % Reduction in Volume: 90% Epithelialization: Medium (34-66%) Tunneling:  No Undermining: No Wound Description Classification: Full Thickness Without Exposed Support Structures Wound Margin: Distinct, outline attached [name] Evans, [name] Evans ([id number]) Exudate Amount: Medium Exudate Type: Serosanguineous Exudate Color: red, brown Foul Odor After Cleansing: No Slough/Fibrino Yes [id number] Page 6 of 6 Wound Bed Granulation Amount: Large (67-100%) Exposed Structure Granulation Quality: Red, Hyper-granulation Fascia Exposed: No Necrotic Amount: Small (1-33%) Fat Layer (Subcutaneous Tissue) Exposed: Yes Necrotic Quality: Adherent Slough Tendon Exposed: No Muscle Exposed: No Joint Exposed: No Bone Exposed: No Periwound Skin Texture Texture Color No Abnormalities Noted: No No Abnormalities Noted: No Callus: No Atrophie Blanche: No Crepitus: No Cyanosis: No Excoriation: No Ecchymosis: Yes Induration: No Erythema: No Rash: No Hemosiderin Staining: Yes Scarring: Yes Mottled: No Pallor: No Moisture Rubor: No No Abnormalities Noted: No Dry / Scaly: No Maceration: No Electronic Signature(s) Signed: 11/02/2022 3:55:49 PM By: Redmond Pulling RN, BSN Entered By: Redmond Pulling on 11/02/2022 08:16:18 -------------------------------------------------------------------------------- Vitals Details Patient Name: Date of Service: [name] Evans 11/02/2022 8:00 A M Medical Record Number: [id number] Patient Account Number: [id number] Date of Birth/Sex: Treating RN: December 05, 1975 (46 y.o. Orville Govern Primary Care Aaliyah Gavel: PA [name] Evans, NO Other Clinician: Referring Porter Nakama: Treating Sinclair Arrazola/Extender: Tilda Franco in Treatment: 5 Vital Signs Time Taken: 08:08 Temperature (F): 98.8 Height (in): 65 Pulse (bpm): 99 Weight (lbs): 115 Respiratory Rate (breaths/min): 18 Body Mass Index (BMI): 19.1 Blood Pressure (mmHg): 136/89 Reference Range: 80 - 120 mg / dl Electronic Signature(s) Signed: 11/02/2022 3:55:49 PM  By: Redmond Pulling RN, BSN Entered By: Redmond Pulling on 11/02/2022 08:08:50

## 2022-11-10 ENCOUNTER — Encounter (HOSPITAL_BASED_OUTPATIENT_CLINIC_OR_DEPARTMENT_OTHER): Payer: BC Managed Care – PPO | Admitting: Internal Medicine

## 2022-11-10 DIAGNOSIS — I87312 Chronic venous hypertension (idiopathic) with ulcer of left lower extremity: Secondary | ICD-10-CM

## 2022-11-10 DIAGNOSIS — L97822 Non-pressure chronic ulcer of other part of left lower leg with fat layer exposed: Secondary | ICD-10-CM | POA: Diagnosis not present

## 2022-11-10 NOTE — Progress Notes (Signed)
[name] Evans ([id number]) [id number] Page 1 of 7 Visit Report for 11/10/2022 Chief Complaint Document Details Patient Name: Date of Service: [name] Evans, [name] Evans 11/10/2022 9:00 [place] Record Number: [id number] Patient Account Number: [id number] Date of Birth/Sex: Treating RN: 1976-10-29 (46 y.o. F) Primary Care Provider: PA Haig Prophet, NO Other Clinician: Referring Provider: Treating Provider/Extender: Yaakov Guthrie in Treatment: 6 Information Obtained from: Patient Chief Complaint 09/28/2022; left lower extremity wound Electronic Signature(s) Signed: 11/10/2022 10:52:01 AM By: Kalman Shan DO Entered By: Kalman Shan on 11/10/2022 10:09:05 -------------------------------------------------------------------------------- HPI Details Patient Name: Date of Service: [name] Evans, [name] Evans 11/10/2022 9:00 [place] Record Number: [id number] Patient Account Number: [id number] Date of Birth/Sex: Treating RN: January 27, 1976 (46 y.o. 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 She states that her mother had a history of venous insufficiency with wounds as well. She has been using bacitracin to the wound bed. She has chronic pain to the area. She was seen in the ED for this issue on 10/21 and 10/24. She completed a 10-day course of doxycycline and Keflex for this issue. She states she works at [employer] and is on her feet for over 12 hours a day. She is not using compression therapy. She currently denies systemic signs of infection. 11/20; patient presents for  follow-up. She has been using Medihoney and Hydrofera Blue to the wound bed. She reports improvement in pain however still has tenderness to the wound sites. She states that she has been elevating her leg and using an Ace bandage. She has been out of work for the past week. She had a PCR culture done at last clinic visit that grew coag negative staph and Candida parapsilosis. Keystone antibiotic ointment has been ordered. 11/27; patient presents for follow-up. She received the [facility] antibiotic ointment and started this 4 days ago. She states that the wound site is still very tender. She has been using an Ace wrap for compression as she cannot tolerate anything tighter. 12/4; patient presents for follow-up. She has been using Keystone antibiotic with Hydrofera Blue under Ace wrap. She states the chronic pain has improved. She brought in short-term disability papers to be filled out. 12/11; patient presents for follow-up. We have been using Keystone antibiotic ointment and Hydrofera Blue under Kerlix/Coban. This is the first time with a true compression wrap. She tolerated this well. She has no issues or complaints today. She was contacted by vein and vascular today for her venous reflux studies. She states she is going to call them back to schedule these. 12/18; patient presents for follow-up. We have been using Keystone antibiotic and Hydrofera Blue under 3 layer compression. She tolerated the wrap well. She has no issues or complaints today. She states she has scheduled her venous reflux studies. 12/26; patient presents for follow-up. We have been using Keystone antibiotic with Hydrofera Blue under 4-layer compression. She tolerated the increase in compression well. She has no issues or complaints today. Electronic Signature(s) Signed: 11/10/2022 10:52:01 AM By: Dyanne Carrel ([id number]) AM By: Kalman Shan DO [id number] Page 2 of  7 Signed: 11/10/2022 10:52:01 Entered By: Kalman Shan on 11/10/2022  10:12:45 -------------------------------------------------------------------------------- Physical Exam Details Patient Name: Date of Service: [name] Evans, [name] Evans 11/10/2022 9:00 A M Medical Record Number: [id number] Patient Account Number: [id number] Date of Birth/Sex: Treating RN: [birth year] (46 y.o. F) Primary Care Provider: PA Haig Prophet, NO Other Clinician: Referring Provider: Treating Provider/Extender: Yaakov Guthrie in Treatment: 6 Constitutional respirations regular, non-labored and within target range for patient.. Cardiovascular 2+ dorsalis pedis/posterior tibialis pulses. Psychiatric pleasant and cooperative. Notes Left lower extremity: T the distal medial aspect there is an open wound with granulation tissue throughout. No surrounding signs of infection. Hemosiderin o staining. Electronic Signature(s) Signed: 11/10/2022 10:52:01 AM By: Kalman Shan DO Entered By: Kalman Shan on 11/10/2022 10:13:38 -------------------------------------------------------------------------------- Physician Orders Details Patient Name: Date of Service: [name] Evans, [name] Evans 11/10/2022 9:00 A M Medical Record Number: [id number] Patient Account Number: [id number] Date of Birth/Sex: Treating RN: 1976/09/19 (46 y.o. [name] Evans Primary Care Provider: PA TIENT, NO Other Clinician: Referring Provider: Treating Provider/Extender: Yaakov Guthrie in Treatment: 6 Verbal / Phone Orders: No Diagnosis Coding ICD-10 Coding Code Description [phone] Non-pressure chronic ulcer of other part of left lower leg with fat layer exposed I87.312 Chronic venous hypertension (idiopathic) with ulcer of left lower extremity Follow-up Appointments ppointment in 1 week. - w/ Dr. Heber [place] Return A ppointment in 2 weeks. - Dr. Heber [place] Return A Other: - Continue to bring in topical antibiotics at each appt  time. Anesthetic (In clinic) Topical Lidocaine 5% applied to wound bed Bathing/ Shower/ Hygiene May shower with protection but do not get wound dressing(s) wet. Protect dressing(s) with water repellant cover (for example, large plastic bag) or a cast cover and may then take shower. Edema Control - Lymphedema / SCD / Other Szymborski, [name] ([id number]) [id number] Page 3 of 7 Avoid standing for long periods of time. Wound Treatment Wound #1 - Lower Leg Wound Laterality: Left, Medial Cleanser: Soap and Water 1 x Per Week/30 Days Discharge Instructions: May shower and wash wound with dial antibacterial soap and water prior to dressing change. Cleanser: Wound Cleanser (Generic) 1 x Per Week/30 Days Discharge Instructions: Cleanse the wound with wound cleanser prior to applying a clean dressing using gauze sponges, not tissue or cotton balls. Peri-Wound Care: Triamcinolone 15 (g) 1 x Per Week/30 Days Discharge Instructions: Use triamcinolone 15 (g) as directed Peri-Wound Care: Sween Lotion (Moisturizing lotion) 1 x Per Week/30 Days Discharge Instructions: Apply moisturizing lotion as directed Topical: Keystone Compounding T opical antibiotics 1 x Per Week/30 Days Discharge Instructions: apply directly to wound bed. Prim Dressing: FIBRACOL Plus Dressing, 2x2 in (collagen) 1 x Per Week/30 Days ary Discharge Instructions: Moisten collagen with saline or hydrogel Secondary Dressing: ABD Pad, 5x9 (Generic) 1 x Per Week/30 Days Discharge Instructions: Apply over primary dressing as directed. Secondary Dressing: Woven Gauze Sponge, Non-Sterile 4x4 in (Generic) 1 x Per Week/30 Days Discharge Instructions: Apply over primary dressing as directed. Compression Wrap: FourPress (4 layer compression wrap) 1 x Per Week/30 Days Discharge Instructions: Apply four layer compression. Electronic Signature(s) Signed: 11/10/2022 10:52:01 AM By: Kalman Shan DO Entered By:  Kalman Shan on 11/10/2022 10:13:54 -------------------------------------------------------------------------------- Problem List Details Patient Name: Date of Service: [name] Evans, [name] Evans 11/10/2022 9:00 A M Medical Record Number: [id number] Patient Account Number: [id number] Date of Birth/Sex: Treating RN: [birth year] (46 y.o. [name] Evans Primary Care Provider: PA Haig Prophet, [place] Other Clinician: Referring Provider: Treating Provider/Extender: Yaakov Guthrie in Treatment: 6 Active Problems ICD-10 Encounter Code Description Active Date MDM Diagnosis L97.822 Non-pressure chronic ulcer of other part  of left lower leg with fat layer 09/28/2022 No Yes exposed I87.312 Chronic venous hypertension (idiopathic) with ulcer of left lower extremity 09/28/2022 No Yes Inactive Problems Resolved Problems Electronic Signature(s) Signed: 11/10/2022 10:52:01 AM By: Era Skeen ([id number]) AM By: Geralyn Corwin DO [id number] Page 4 of 7 Signed: 11/10/2022 10:52:01 Entered By: Geralyn Corwin on 11/10/2022 10:08:52 -------------------------------------------------------------------------------- Progress Note Details Patient Name: Date of Service: [name] Evans, [name] Evans 11/10/2022 9:00 A M Medical Record Number: [id number] Patient Account Number: [id number] Date of Birth/Sex: Treating RN: 12/15/1975 (46 y.o. F) Primary Care Provider: PA Zenovia Jordan, NO Other Clinician: Referring Provider: Treating Provider/Extender: Tilda Franco in Treatment: 6 Subjective Chief Complaint Information obtained from Patient 09/28/2022; left lower extremity wound History of Present Illness (HPI) Admission 09/28/2022 Ms. 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  She states that her mother had a history of venous insufficiency with wounds as well. She has been using bacitracin to the wound bed. She has chronic pain to the area. She was seen in the ED for this issue on 10/21 and 10/24. She completed a 10-day course of doxycycline and Keflex for this issue. She states she works at [employer] and is on her feet for over 12 hours a day. She is not using compression therapy. She currently denies systemic signs of infection. 11/20; patient presents for follow-up. She has been using Medihoney and Hydrofera Blue to the wound bed. She reports improvement in pain however still has tenderness to the wound sites. She states that she has been elevating her leg and using an Ace bandage. She has been out of work for the past week. She had a PCR culture done at last clinic visit that grew coag negative staph and Candida parapsilosis. Keystone antibiotic ointment has been ordered. 11/27; patient presents for follow-up. She received the [facility] antibiotic ointment and started this 4 days ago. She states that the wound site is still very tender. She has been using an Ace wrap for compression as she cannot tolerate anything tighter. 12/4; patient presents for follow-up. She has been using Keystone antibiotic with Hydrofera Blue under Ace wrap. She states the chronic pain has improved. She brought in short-term disability papers to be filled out. 12/11; patient presents for follow-up. We have been using Keystone antibiotic ointment and Hydrofera Blue under Kerlix/Coban. This is the first time with a true compression wrap. She tolerated this well. She has no issues or complaints today. She was contacted by vein and vascular today for her venous reflux studies. She states she is going to call them back to schedule these. 12/18; patient presents for follow-up. We have been using Keystone antibiotic and Hydrofera Blue under 3 layer compression. She tolerated the wrap well. She has no  issues or complaints today. She states she has scheduled her venous reflux studies. 12/26; patient presents for follow-up. We have been using Keystone antibiotic with Hydrofera Blue under 4-layer compression. She tolerated the increase in compression well. She has no issues or complaints today. Patient History Information obtained from Patient, Chart. Family History Unknown History, Cancer - Father, Hypertension - Mother, Lung Disease - Father, Thyroid Problems - Mother,Siblings, No family history of Tuberculosis. Social History Current every day smoker - e-ciggs, Alcohol Use - Never, Drug Use - No History, Caffeine Use -  Rarely. Medical History Cardiovascular Patient has history of Peripheral Venous Disease Hospitalization/Surgery History - IVC filter. - tubal ligation. Medical A Surgical History Notes nd Hematologic/Lymphatic DVT Musculoskeletal scoliosis [name] Evans, [name] Evans ([id number]) [id number] Page 5 of 7 Objective Constitutional respirations regular, non-labored and within target range for patient.. Vitals Time Taken: 9:12 AM, Height: 65 in, Weight: 115 lbs, BMI: 19.1, Temperature: 98.4 F, Pulse: 92 bpm, Respiratory Rate: 18 breaths/min, Blood Pressure: 128/84 mmHg. Cardiovascular 2+ dorsalis pedis/posterior tibialis pulses. Psychiatric pleasant and cooperative. General Notes: Left lower extremity: T the distal medial aspect there is an open wound with granulation tissue throughout. No surrounding signs of infection. o Hemosiderin staining. Integumentary (Hair, Skin) Wound #1 status is Open. Original cause of wound was Laceration. The date acquired was: 04/16/2022. The wound has been in treatment 6 weeks. The wound is located on the Left,Medial Lower Leg. The wound measures 2.8cm length x 1.1cm width x 0.1cm depth; 2.419cm^2 area and 0.242cm^3 volume. There is Fat Layer (Subcutaneous Tissue) exposed. There is no tunneling or undermining noted.  There is a medium amount of serosanguineous drainage noted. The wound margin is distinct with the outline attached to the wound base. There is large (67-100%) red granulation within the wound bed. There is no necrotic tissue within the wound bed. The periwound skin appearance exhibited: Scarring, Ecchymosis, Hemosiderin Staining. The periwound skin appearance did not exhibit: Callus, Crepitus, Excoriation, Induration, Rash, Dry/Scaly, Maceration, Atrophie Blanche, Cyanosis, Mottled, Pallor, Rubor, Erythema. 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 I recommended switching the dressing from Hydrofera Blue to collagen at this time. Continue 4-layer compression. Follow-up in 1 week. Procedures Wound #1 Pre-procedure diagnosis of Wound #1 is a Venous Leg Ulcer located on the Left,Medial Lower Leg . There was a Four Layer Compression Therapy Procedure by Deon Pilling, RN. Post procedure Diagnosis Wound #1: Same as Pre-Procedure Plan Follow-up Appointments: Return Appointment in 1 week. - w/ Dr. Heber [place] Return Appointment in 2 weeks. - Dr. Heber [place] Other: - Continue to bring in topical antibiotics at each appt time. Anesthetic: (In clinic) Topical Lidocaine 5% applied to wound bed Bathing/ Shower/ Hygiene: May shower with protection but do not get wound dressing(s) wet. Protect dressing(s) with water repellant cover (for example, large plastic bag) or a cast cover and may then take shower. Edema Control - Lymphedema / SCD / Other: Avoid standing for long periods of time. WOUND #1: - Lower Leg Wound Laterality: Left, Medial Cleanser: Soap and Water 1 x Per Week/30 Days Discharge Instructions: May shower and wash wound with dial antibacterial soap and water prior  to dressing change. Cleanser: Wound Cleanser (Generic) 1 x Per Week/30 Days Discharge Instructions: Cleanse the wound with wound cleanser prior to applying a clean dressing using gauze sponges, not tissue or cotton balls. Peri-Wound Care: Triamcinolone 15 (g) 1 x Per Week/30 Days Discharge Instructions: Use triamcinolone 15 (g) as directed Peri-Wound Care: Sween Lotion (Moisturizing lotion) 1 x Per Week/30 Days Discharge Instructions: Apply moisturizing lotion as directed Topical: Keystone Compounding T opical antibiotics 1 x Per Week/30 Days Discharge Instructions: apply directly to wound bed. Prim Dressing: FIBRACOL Plus Dressing, 2x2 in (collagen) 1 x Per Week/30 Days ary Discharge Instructions: Moisten collagen with saline or hydrogel Secondary Dressing: ABD Pad, 5x9 (Generic) 1 x Per Week/30 Days Discharge Instructions: Apply over  primary dressing as directed. [name] Evans, [name] Evans ([id number]) [id number] Page 6 of 7 Secondary Dressing: Woven Gauze Sponge, Non-Sterile 4x4 in (Generic) 1 x Per Week/30 Days Discharge Instructions: Apply over primary dressing as directed. Compression Wrap: FourPress (4 layer compression wrap) 1 x Per Week/30 Days Discharge Instructions: Apply four layer compression. 1. Collagen under 4-layer compressionooleft lower extremity 2. Follow-up in 1 week Electronic Signature(s) Signed: 11/10/2022 10:52:01 AM By: Kalman Shan DO Entered By: Kalman Shan on 11/10/2022 10:16:20 -------------------------------------------------------------------------------- HxROS Details Patient Name: Date of Service: [name] Evans, [name] Evans 11/10/2022 9:00 A M Medical Record Number: [id number] Patient Account Number: [id number] Date of Birth/Sex: Treating RN: 1975-12-21 (46 y.o. F) Primary Care Provider: PA Haig Prophet, NO Other Clinician: Referring Provider: Treating Provider/Extender: Yaakov Guthrie in Treatment: 6 Information Obtained  From Patient Chart Hematologic/Lymphatic Medical History: Past Medical History Notes: DVT Cardiovascular Medical History: Positive for: Peripheral Venous Disease Musculoskeletal Medical History: Past Medical History Notes: scoliosis Immunizations Pneumococcal Vaccine: Received Pneumococcal Vaccination: No Implantable Devices None Hospitalization / Surgery History Type of Hospitalization/Surgery IVC filter tubal ligation Family and Social History Unknown History: Yes; Cancer: Yes - Father; Hypertension: Yes - Mother; Lung Disease: Yes - Father; Thyroid Problems: Yes - Mother,Siblings; Tuberculosis: No; Current every day smoker - e-ciggs; Alcohol Use: Never; Drug Use: No History; Caffeine Use: Rarely; Financial Concerns: No; Food, Clothing or Shelter Needs: No; Support System Lacking: No; Transportation Concerns: No Electronic Signature(s) Signed: 11/10/2022 10:52:01 AM By: Kalman Shan DO Entered By: Kalman Shan on 11/10/2022 10:12:54 Nakamura, [name] ([id number]) [id number] Page 7 of 7 -------------------------------------------------------------------------------- SuperBill Details Patient Name: Date of Service: [name] Evans, [name] Evans 11/10/2022 Medical Record Number: [id number] Patient Account Number: [id number] Date of Birth/Sex: Treating RN: [birth year] (46 y.o. [name] Evans Primary Care Provider: PA TIENT, [place] Other Clinician: Referring Provider: Treating Provider/Extender: Yaakov Guthrie in Treatment: 6 Diagnosis Coding ICD-10 Codes Code Description [phone] Non-pressure chronic ulcer of other part of left lower leg with fat layer exposed I87.312 Chronic venous hypertension (idiopathic) with ulcer of left lower extremity Facility Procedures : CPT4 Code: [id number] Description: (Facility Use Only) [id number] - [facility] LWR LT LEG Modifier: Quantity: 1 Physician Procedures : CPT4 Code Description Modifier [id number]  [id number] - WC PHYS LEVEL 3 - EST PT ICD-10 Diagnosis Description L97.822 Non-pressure chronic ulcer of other part of left lower leg with fat layer exposed I87.312 Chronic venous hypertension (idiopathic) with ulcer  of left lower extremity Quantity: 1 Electronic Signature(s) Signed: 11/10/2022 10:52:01 AM By: Kalman Shan DO Entered By: Kalman Shan on 11/10/2022 10:16:30

## 2022-11-10 NOTE — Progress Notes (Signed)
Celedonio MiyamotoITTS, Millenia ([id number]) [id number] Page 1 of 8 Visit Report for 11/10/2022 Arrival Information Details Patient Name: Date of Service: Lowella PettiesITTS, JA CQ UELINE 11/10/2022 9:00 A M Medical Record Number: [id number] Patient Account Number: [id number] Date of Birth/Sex: Treating RN: 08/09/1976 (46 y.o. F) Primary Care Troyce Gieske: PA TIENT, NO Other Clinician: Referring Birney Belshe: Treating Shahed Yeoman/Extender: Tilda FrancoHoffman, Jessica Weeks in Treatment: 6 Visit Information History Since Last Visit Added or deleted any medications: No Patient Arrived: Ambulatory Any new allergies or adverse reactions: No Arrival Time: 09:12 Had a fall or experienced change in No Accompanied By: self activities of daily living that may affect Transfer Assistance: None risk of falls: Patient Identification Verified: Yes Signs or symptoms of abuse/neglect since last visito No Secondary Verification Process Completed: Yes Hospitalized since last visit: No Patient Requires Transmission-Based Precautions: No Implantable device outside of the clinic excluding No Patient Has Alerts: No cellular tissue based products placed in the center since last visit: Has Compression in Place as Prescribed: Yes Pain Present Now: No Electronic Signature(s) Signed: 11/10/2022 4:02:42 PM By: Thayer Dallasick, Kimberly Entered By: Thayer Dallasick, Kimberly on 11/10/2022 09:12:45 -------------------------------------------------------------------------------- Compression Therapy Details Patient Name: Date of Service: Lowella PettiesITTS, JA CQ UELINE 11/10/2022 9:00 A M Medical Record Number: [id number] Patient Account Number: [id number] Date of Birth/Sex: Treating RN: 06/07/1976 (46 y.o. Arta SilenceF) Deaton, Bobbi Primary Care Jaelin Devincentis: PA Zenovia JordanIENT, [place] Other Clinician: Referring Aleka Twitty: Treating Kathleen Tamm/Extender: Tilda FrancoHoffman, Jessica Weeks in Treatment: 6 Compression Therapy Performed for Wound Assessment: Wound #1 Left,Medial Lower Leg Performed By:  Clinician Shawn Stalleaton, Bobbi, RN Compression Type: Four Layer Post Procedure Diagnosis Same as Pre-procedure Electronic Signature(s) Signed: 11/10/2022 1:12:16 PM By: Shawn Stalleaton, Bobbi RN, BSN Entered By: Shawn Stalleaton, Bobbi on 11/10/2022 10:06:43 Celedonio MiyamotoPITTS, Jazlynne ([id number]) [id number] Page 2 of 8 -------------------------------------------------------------------------------- Encounter Discharge Information Details Patient Name: Date of Service: Lowella PettiesITTS, JA CQ UELINE 11/10/2022 9:00 A M Medical Record Number: [id number] Patient Account Number: [id number] Date of Birth/Sex: Treating RN: 02/05/1976 (46 y.o. Arta SilenceF) Deaton, Bobbi Primary Care Afsheen Antony: PA Zenovia JordanIENT, NO Other Clinician: Referring Maceo Hernan: Treating Michaelia Beilfuss/Extender: Tilda FrancoHoffman, Jessica Weeks in Treatment: 6 Encounter Discharge Information Items Discharge Condition: Stable Ambulatory Status: Ambulatory Discharge Destination: Home Transportation: Private Auto Accompanied By: self Schedule Follow-up Appointment: Yes Clinical Summary of Care: Electronic Signature(s) Signed: 11/10/2022 1:12:16 PM By: Shawn Stalleaton, Bobbi RN, BSN Entered By: Shawn Stalleaton, Bobbi on 11/10/2022 10:08:29 -------------------------------------------------------------------------------- Lower Extremity Assessment Details Patient Name: Date of Service: Lowella PettiesITTS, JA CQ UELINE 11/10/2022 9:00 A M Medical Record Number: [id number] Patient Account Number: [id number] Date of Birth/Sex: Treating RN: 10/20/1976 (46 y.o. F) Primary Care Riaz Onorato: PA Zenovia JordanIENT, NO Other Clinician: Referring Tayson Schnelle: Treating Aerielle Stoklosa/Extender: Tilda FrancoHoffman, Jessica Weeks in Treatment: 6 Edema Assessment Assessed: [Left: No] [Right: No] Edema: [Left: N] [Right: o] Calf Left: Right: Point of Measurement: 28 cm From Medial Instep 32 cm Ankle Left: Right: Point of Measurement: 10 cm From Medial Instep 21 cm Electronic Signature(s) Signed: 11/10/2022 4:02:42 PM By: Thayer Dallasick, Kimberly Entered  By: Thayer Dallasick, Kimberly on 11/10/2022 09:19:04 -------------------------------------------------------------------------------- Multi Wound Chart Details Patient Name: Date of Service: Lowella PettiesPITTS, JA CQ UELINE 11/10/2022 9:00 A M Medical Record Number: [id number] Patient Account Number: [id number] Date of Birth/Sex: Treating RN: 10/22/1976 (46 y.o. F) Primary Care Jozalynn Noyce: PA Zenovia JordanIENT, NO Other Clinician: Referring Makaylah Oddo: Treating Tymara Saur/Extender: Tilda FrancoHoffman, Jessica Weeks in Treatment: 6 Ishmael, Adela LankJACQUELINE ([id number]) [id number] Page 3 of 8 Vital Signs Height(in): 65 Pulse(bpm): 92 Weight(lbs): 115 Blood Pressure(mmHg): 128/84 Body Mass Index(BMI): 19.1 Temperature(F): 98.4 Respiratory Rate(breaths/min): 18 [1:Photos:] [N/A:N/A] Left, Medial Lower Leg N/A N/A Wound Location: Laceration N/A N/A  Wounding Event: Venous Leg Ulcer N/A N/A Primary Etiology: Peripheral Venous Disease N/A N/A Comorbid History: 04/16/2022 N/A N/A Date Acquired: 6 N/A N/A Weeks of Treatment: Open N/A N/A Wound Status: No N/A N/A Wound Recurrence: 2.8x1.1x0.1 N/A N/A Measurements L x W x D (cm) 2.419 N/A N/A A (cm) : rea 0.242 N/A N/A Volume (cm) : 89.00% N/A N/A % Reduction in A rea: 94.50% N/A N/A % Reduction in Volume: Full Thickness Without Exposed N/A N/A Classification: Support Structures Medium N/A N/A Exudate Amount: Serosanguineous N/A N/A Exudate Type: red, brown N/A N/A Exudate Color: Distinct, outline attached N/A N/A Wound Margin: Large (67-100%) N/A N/A Granulation Amount: Red N/A N/A Granulation Quality: None Present (0%) N/A N/A Necrotic Amount: Fat Layer (Subcutaneous Tissue): Yes N/A N/A Exposed Structures: Fascia: No Tendon: No Muscle: No Joint: No Bone: No Medium (34-66%) N/A N/A Epithelialization: Scarring: Yes N/A N/A Periwound Skin Texture: Excoriation: No Induration: No Callus: No Crepitus: No Rash: No Maceration: No N/A  N/A Periwound Skin Moisture: Dry/Scaly: No Ecchymosis: Yes N/A N/A Periwound Skin Color: Hemosiderin Staining: Yes Atrophie Blanche: No Cyanosis: No Erythema: No Mottled: No Pallor: No Rubor: No Compression Therapy N/A N/A Procedures Performed: Treatment Notes Wound #1 (Lower Leg) Wound Laterality: Left, Medial Cleanser Soap and Water Discharge Instruction: May shower and wash wound with dial antibacterial soap and water prior to dressing change. Wound Cleanser Discharge Instruction: Cleanse the wound with wound cleanser prior to applying a clean dressing using gauze sponges, not tissue or cotton balls. Peri-Wound Care Triamcinolone 15 (g) Discharge Instruction: Use triamcinolone 15 (g) as directed Sween Lotion (Moisturizing lotion) Fruth, Patirica ([id number]) [id number] Page 4 of 8 Discharge Instruction: Apply moisturizing lotion as directed Topical Keystone Compounding T opical antibiotics Discharge Instruction: apply directly to wound bed. Primary Dressing FIBRACOL Plus Dressing, 2x2 in (collagen) Discharge Instruction: Moisten collagen with saline or hydrogel Secondary Dressing ABD Pad, 5x9 Discharge Instruction: Apply over primary dressing as directed. Woven Gauze Sponge, Non-Sterile 4x4 in Discharge Instruction: Apply over primary dressing as directed. Secured With Compression Wrap FourPress (4 layer compression wrap) Discharge Instruction: Apply four layer compression. Compression Stockings Add-Ons Electronic Signature(s) Signed: 11/10/2022 10:52:01 AM By: Geralyn Corwin DO Entered By: Geralyn Corwin on 11/10/2022 10:08:57 -------------------------------------------------------------------------------- Multi-Disciplinary Care Plan Details Patient Name: Date of Service: ELYSABETH, AUST 11/10/2022 9:00 A M Medical Record Number: [id number] Patient Account Number: [id number] Date of Birth/Sex: Treating RN: 06-Feb-1976 (46 y.o.  Arta Silence Primary Care Ivianna Notch: PA Zenovia Jordan, NO Other Clinician: Referring Yolani Vo: Treating Olanda Downie/Extender: Tilda Franco in Treatment: 6 Active Inactive Wound/Skin Impairment Nursing Diagnoses: Impaired tissue integrity Knowledge deficit related to ulceration/compromised skin integrity Goals: Patient will have a decrease in wound volume by X% from date: (specify in notes) Date Initiated: 09/16/2022 Target Resolution Date: 12/11/2022 Goal Status: Active Patient/caregiver will verbalize understanding of skin care regimen Date Initiated: 09/16/2022 Target Resolution Date: 12/11/2022 Goal Status: Active Ulcer/skin breakdown will have a volume reduction of 30% by week 4 Date Initiated: 09/16/2022 Target Resolution Date: 11/14/2022 Goal Status: Active Interventions: Assess patient/caregiver ability to obtain necessary supplies Assess patient/caregiver ability to perform ulcer/skin care regimen upon admission and as needed Assess ulceration(s) every visit Notes: FAWNE, HUGHLEY ([id number]) [phone].pdf Page 5 of 8 Electronic Signature(s) Signed: 11/10/2022 1:12:16 PM By: Shawn Stall RN, BSN Entered By: Shawn Stall on 11/10/2022 10:07:46 -------------------------------------------------------------------------------- Pain Assessment Details Patient Name: Date of Service: AYALA, RIBBLE 11/10/2022 9:00 A M Medical Record Number: [id number] Patient Account Number: [id number] Date of  Birth/Sex: Treating RN: Jan 03, 1976 (46 y.o. F) Primary Care Colsen Modi: PA Zenovia Jordan, NO Other Clinician: Referring Kandi Brusseau: Treating Jaliel Deavers/Extender: Tilda Franco in Treatment: 6 Active Problems Location of Pain Severity and Description of Pain Patient Has Paino No Site Locations Pain Management and Medication Current Pain Management: Electronic Signature(s) Signed: 11/10/2022 4:02:42 PM By: Thayer Dallas Entered By: Thayer Dallas on  11/10/2022 09:13:10 -------------------------------------------------------------------------------- Patient/Caregiver Education Details Patient Name: Date of Service: Lowella Petties 12/26/2023andnbsp9:00 A M Medical Record Number: [id number] Patient Account Number: [id number] Date of Birth/Gender: Treating RN: 03-Aug-1976 (46 y.o. Arta Silence Primary Care Physician: PA Zenovia Jordan, [place] Other Clinician: Referring Physician: Treating Physician/Extender: Tilda Franco in Treatment: 6 Education Assessment Education Provided ToGREIDY, SHERARD ([id number]) [id number] Page 6 of 8 Patient Education Topics Provided Wound/Skin Impairment: Handouts: Caring for Your Ulcer Methods: Explain/Verbal Responses: Reinforcements needed Electronic Signature(s) Signed: 11/10/2022 1:12:16 PM By: Shawn Stall RN, BSN Entered By: Shawn Stall on 11/10/2022 10:08:00 -------------------------------------------------------------------------------- Wound Assessment Details Patient Name: Date of Service: KIAIRA, POINTER 11/10/2022 9:00 A M Medical Record Number: [id number] Patient Account Number: [id number] Date of Birth/Sex: Treating RN: 1976/05/19 (46 y.o. F) Primary Care Teyah Rossy: PA TIENT, NO Other Clinician: Referring Che Below: Treating Ambre Kobayashi/Extender: Tilda Franco in Treatment: 6 Wound Status Wound Number: 1 Primary Etiology: Venous Leg Ulcer Wound Location: Left, Medial Lower Leg Wound Status: Open Wounding Event: Laceration Comorbid History: Peripheral Venous Disease Date Acquired: 04/16/2022 Weeks Of Treatment: 6 Clustered Wound: No Photos Wound Measurements Length: (cm) 2.8 Width: (cm) 1.1 Depth: (cm) 0.1 Area: (cm) 2.419 Volume: (cm) 0.242 % Reduction in Area: 89% % Reduction in Volume: 94.5% Epithelialization: Medium (34-66%) Tunneling: No Undermining: No Wound Description Classification: Full Thickness Without Exposed  Suppor Wound Margin: Distinct, outline attached Exudate Amount: Medium Exudate Type: Serosanguineous Exudate Color: red, brown t Structures Foul Odor After Cleansing: No Slough/Fibrino Yes Wound Bed Granulation Amount: Large (67-100%) Exposed Structure Granulation Quality: Red Fascia Exposed: No Necrotic Amount: None Present (0%) Fat Layer (Subcutaneous Tissue) Exposed: Yes Tendon Exposed: No Muscle Exposed: No Joint Exposed: No Elvin, Nayeliz ([id number]) [id number] Page 7 of 8 Bone Exposed: No Periwound Skin Texture Texture Color No Abnormalities Noted: No No Abnormalities Noted: No Callus: No Atrophie Blanche: No Crepitus: No Cyanosis: No Excoriation: No Ecchymosis: Yes Induration: No Erythema: No Rash: No Hemosiderin Staining: Yes Scarring: Yes Mottled: No Pallor: No Moisture Rubor: No No Abnormalities Noted: No Dry / Scaly: No Maceration: No Treatment Notes Wound #1 (Lower Leg) Wound Laterality: Left, Medial Cleanser Soap and Water Discharge Instruction: May shower and wash wound with dial antibacterial soap and water prior to dressing change. Wound Cleanser Discharge Instruction: Cleanse the wound with wound cleanser prior to applying a clean dressing using gauze sponges, not tissue or cotton balls. Peri-Wound Care Triamcinolone 15 (g) Discharge Instruction: Use triamcinolone 15 (g) as directed Sween Lotion (Moisturizing lotion) Discharge Instruction: Apply moisturizing lotion as directed Topical Keystone Compounding T opical antibiotics Discharge Instruction: apply directly to wound bed. Primary Dressing FIBRACOL Plus Dressing, 2x2 in (collagen) Discharge Instruction: Moisten collagen with saline or hydrogel Secondary Dressing ABD Pad, 5x9 Discharge Instruction: Apply over primary dressing as directed. Woven Gauze Sponge, Non-Sterile 4x4 in Discharge Instruction: Apply over primary dressing as directed. Secured  With Compression Wrap FourPress (4 layer compression wrap) Discharge Instruction: Apply four layer compression. Compression Stockings Add-Ons Electronic Signature(s) Signed: 11/10/2022 4:02:42 PM By: Thayer Dallas Entered By: Thayer Dallas on 11/10/2022 09:31:30 -------------------------------------------------------------------------------- Vitals Details Patient Name: Date of Service: Prasad,  JA CQ UELINE 11/10/2022 9:00 A M Medical Record Number: [id number] Patient Account Number: [id number] Date of Birth/Sex: Treating RN: 1976-01-08 (46 y.o. F) Primary Care Alisha Bacus: PA Fidela [place] Other ClinicianCARMALITA, WAKEFIELD ([id number]) [id number] Page 8 of 8 Referring Haddie Bruhl: Treating Devonne Kitchen/Extender: Tilda Franco in Treatment: 6 Vital Signs Time Taken: 09:12 Temperature (F): 98.4 Height (in): 65 Pulse (bpm): 92 Weight (lbs): 115 Respiratory Rate (breaths/min): 18 Body Mass Index (BMI): 19.1 Blood Pressure (mmHg): 128/84 Reference Range: 80 - 120 mg / dl Electronic Signature(s) Signed: 11/10/2022 4:02:42 PM By: Thayer Dallas Entered By: Thayer Dallas on 11/10/2022 09:13:04

## 2022-11-17 ENCOUNTER — Encounter (HOSPITAL_BASED_OUTPATIENT_CLINIC_OR_DEPARTMENT_OTHER): Payer: BC Managed Care – PPO | Attending: Internal Medicine | Admitting: Internal Medicine

## 2022-11-17 DIAGNOSIS — Z86718 Personal history of other venous thrombosis and embolism: Secondary | ICD-10-CM | POA: Diagnosis not present

## 2022-11-17 DIAGNOSIS — I872 Venous insufficiency (chronic) (peripheral): Secondary | ICD-10-CM | POA: Diagnosis not present

## 2022-11-17 DIAGNOSIS — G8929 Other chronic pain: Secondary | ICD-10-CM | POA: Diagnosis not present

## 2022-11-17 DIAGNOSIS — L97822 Non-pressure chronic ulcer of other part of left lower leg with fat layer exposed: Secondary | ICD-10-CM | POA: Diagnosis not present

## 2022-11-17 DIAGNOSIS — I87312 Chronic venous hypertension (idiopathic) with ulcer of left lower extremity: Secondary | ICD-10-CM | POA: Diagnosis not present

## 2022-11-17 DIAGNOSIS — F17290 Nicotine dependence, other tobacco product, uncomplicated: Secondary | ICD-10-CM | POA: Diagnosis not present

## 2022-11-17 NOTE — Progress Notes (Signed)
[name], Evans ([id number]) [id number] Page 1 of 7 Visit Report for 11/17/2022 Chief Complaint Document Details Patient Name: Date of Service: [name] Evans, [name] Evans 11/17/2022 9:15 A M Medical Record Number: [id number] Patient Account Number: [id number] Date of Birth/Sex: Treating RN: February 10, 1976 (47 y.o. F) Primary Care Provider: PA Zenovia Jordan, NO Other Clinician: Referring Provider: Treating Provider/Extender: Tilda Franco in Treatment: 7 Information Obtained from: Patient Chief Complaint 09/28/2022; left lower extremity wound Electronic Signature(s) Signed: 11/17/2022 12:20:08 PM By: Geralyn Corwin DO Entered By: Geralyn Corwin on 11/17/2022 10:44:02 -------------------------------------------------------------------------------- HPI Details Patient Name: Date of Service: [name] Evans, [name] Evans 11/17/2022 9:15 A M Medical Record Number: [id number] Patient Account Number: [id number] Date of Birth/Sex: Treating RN: [birth year] (47 y.o. F) Primary Care Provider: PA Zenovia Jordan, NO Other Clinician: Referring Provider: Treating Provider/Extender: Tilda Franco in Treatment: 7 History of Present Illness HPI Description: Admission 09/28/2022 Ms. 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 She states that her mother had a history of venous insufficiency with wounds as well. She has been using bacitracin to the wound bed. She has chronic pain to the area. She was seen in the ED for this issue on 10/21 and 10/24. She completed a 10-day course of doxycycline and Keflex for this issue. She states she works at [employer] and is on her feet for over 12 hours a day. She is not using compression therapy. She currently denies systemic signs of infection. 11/20; patient presents for follow-up.  She has been using Medihoney and Hydrofera Blue to the wound bed. She reports improvement in pain however still has tenderness to the wound sites. She states that she has been elevating her leg and using an Ace bandage. She has been out of work for the past week. She had a PCR culture done at last clinic visit that grew coag negative staph and Candida parapsilosis. Keystone antibiotic ointment has been ordered. 11/27; patient presents for follow-up. She received the [facility] antibiotic ointment and started this 4 days ago. She states that the wound site is still very tender. She has been using an Ace wrap for compression as she cannot tolerate anything tighter. 12/4; patient presents for follow-up. She has been using Keystone antibiotic with Hydrofera Blue under Ace wrap. She states the chronic pain has improved. She brought in short-term disability papers to be filled out. 12/11; patient presents for follow-up. We have been using Keystone antibiotic ointment and Hydrofera Blue under Kerlix/Coban. This is the first time with a true compression wrap. She tolerated this well. She has no issues or complaints today. She was contacted by vein and vascular today for her venous reflux studies. She states she is going to call them back to schedule these. 12/18; patient presents for follow-up. We have been using Keystone antibiotic and Hydrofera Blue under 3 layer compression. She tolerated the wrap well. She has no issues or complaints today. She states she has scheduled her venous reflux studies. 12/26; patient presents for follow-up. We have been using Keystone antibiotic with Hydrofera Blue under 4-layer compression. She tolerated the increase in compression well. She has no issues or complaints today. 1/2; patient presents for follow-up. We have been using Keystone antibiotic with collagen under 4-layer compression. She has tolerated this well. She would like to return back to work. [name], Evans  ([id number])  [id number] Page 2 of 7 Electronic Signature(s) Signed: 11/17/2022 12:20:08 PM By: Kalman Shan DO Entered By: Kalman Shan on 11/17/2022 10:45:19 -------------------------------------------------------------------------------- Physical Exam Details Patient Name: Date of Service: [name] Evans, [name] Evans 11/17/2022 9:15 A M Medical Record Number: [id number] Patient Account Number: [id number] Date of Birth/Sex: Treating RN: [birth year] (47 y.o. F) Primary Care Provider: PA Haig Prophet, NO Other Clinician: Referring Provider: Treating Provider/Extender: Yaakov Guthrie in Treatment: 7 Constitutional respirations regular, non-labored and within target range for patient.. Cardiovascular 2+ dorsalis pedis/posterior tibialis pulses. Psychiatric pleasant and cooperative. Notes Left lower extremity: T the distal medial aspect there is an open wound with granulation tissue throughout. No surrounding signs of infection. Hemosiderin o staining. Electronic Signature(s) Signed: 11/17/2022 12:20:08 PM By: Kalman Shan DO Entered By: Kalman Shan on 11/17/2022 10:45:44 -------------------------------------------------------------------------------- Physician Orders Details Patient Name: Date of Service: [name] Evans, [name] Evans 11/17/2022 9:15 A M Medical Record Number: [id number] Patient Account Number: [id number] Date of Birth/Sex: Treating RN: Mar 26, 1976 (47 y.o. [name] Primary Care Provider: PA TIENT, NO Other Clinician: Referring Provider: Treating Provider/Extender: Yaakov Guthrie in Treatment: 7 Verbal / Phone Orders: No Diagnosis Coding ICD-10 Coding Code Description [phone] Non-pressure chronic ulcer of other part of left lower leg with fat layer exposed I87.312 Chronic venous hypertension (idiopathic) with ulcer of left lower extremity Follow-up Appointments ppointment in 1 week. - w/ Dr. Heber [place] Return A ppointment in 2  weeks. - Dr. Heber [place] Return A Other: - Continue to bring in topical antibiotics at each appt time. ***May return to work full time with no restrictions.**** Anesthetic (In clinic) Topical Lidocaine 5% applied to wound bed [address] [name] Evans, [name] Evans ([id number]) [id number] Page 3 of 7 May shower with protection but do not get wound dressing(s) wet. Protect dressing(s) with water repellant cover (for example, large plastic bag) or a cast cover and may then take shower. Edema Control - Lymphedema / SCD / Other Avoid standing for long periods of time. Wound Treatment Wound #1 - Lower Leg Wound Laterality: Left, Medial Cleanser: Soap and Water 1 x Per Week/30 Days Discharge Instructions: May shower and wash wound with dial antibacterial soap and water prior to dressing change. Cleanser: Wound Cleanser (Generic) 1 x Per Week/30 Days Discharge Instructions: Cleanse the wound with wound cleanser prior to applying a clean dressing using gauze sponges, not tissue or cotton balls. Peri-Wound Care: Triamcinolone 15 (g) 1 x Per Week/30 Days Discharge Instructions: Use triamcinolone 15 (g) as directed Peri-Wound Care: Sween Lotion (Moisturizing lotion) 1 x Per Week/30 Days Discharge Instructions: Apply moisturizing lotion as directed Topical: Keystone Compounding T opical antibiotics 1 x Per Week/30 Days Discharge Instructions: apply directly to wound bed. Prim Dressing: FIBRACOL Plus Dressing, 2x2 in (collagen) 1 x Per Week/30 Days ary Discharge Instructions: Moisten collagen with saline or hydrogel Secondary Dressing: ABD Pad, 5x9 (Generic) 1 x Per Week/30 Days Discharge Instructions: Apply over primary dressing as directed. Secondary Dressing: Woven Gauze Sponge, Non-Sterile 4x4 in (Generic) 1 x Per Week/30 Days Discharge Instructions: Apply over primary dressing as directed. Compression Wrap: FourPress (4 layer compression wrap) 1 x Per Week/30  Days Discharge Instructions: Apply four layer compression. Electronic Signature(s) Signed: 11/17/2022 12:20:08 PM By: Kalman Shan DO Entered By: Kalman Shan on 11/17/2022 10:45:52 -------------------------------------------------------------------------------- Problem List Details Patient Name: Date of Service: ZONYA, GUDGER 11/17/2022 9:15 A M Medical Record Number: [id number] Patient Account Number: [id number] Date of Birth/Sex: Treating RN: [birth year] (47 y.o. [name] Primary Care Provider: PA TIENT,  NO Other Clinician: Referring Provider: Treating Provider/Extender: Tilda Franco in Treatment: 7 Active Problems ICD-10 Encounter Code Description Active Date MDM Diagnosis L97.822 Non-pressure chronic ulcer of other part of left lower leg with fat layer 09/28/2022 No Yes exposed I87.312 Chronic venous hypertension (idiopathic) with ulcer of left lower extremity 09/28/2022 No Yes Inactive Problems Resolved Problems Wehmeyer, [name] ([id number]) [id number] Page 4 of 7 Electronic Signature(s) Signed: 11/17/2022 12:20:08 PM By: Geralyn Corwin DO Entered By: Geralyn Corwin on 11/17/2022 10:43:47 -------------------------------------------------------------------------------- Progress Note Details Patient Name: Date of Service: SHONNA, DEITER 11/17/2022 9:15 A M Medical Record Number: [id number] Patient Account Number: [id number] Date of Birth/Sex: Treating RN: 18-Jul-1976 (47 y.o. F) Primary Care Provider: PA Zenovia Jordan, NO Other Clinician: Referring Provider: Treating Provider/Extender: Tilda Franco in Treatment: 7 Subjective Chief Complaint Information obtained from Patient 09/28/2022; left lower extremity wound History of Present Illness (HPI) Admission 09/28/2022 Ms. 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 She states that her mother had a history of venous insufficiency with wounds as well. She has been using bacitracin to the wound bed. She has chronic pain to the area. She was seen in the ED for this issue on 10/21 and 10/24. She completed a 10-day course of doxycycline and Keflex for this issue. She states she works at [employer] and is on her feet for over 12 hours a day. She is not using compression therapy. She currently denies systemic signs of infection. 11/20; patient presents for follow-up. She has been using Medihoney and Hydrofera Blue to the wound bed. She reports improvement in pain however still has tenderness to the wound sites. She states that she has been elevating her leg and using an Ace bandage. She has been out of work for the past week. She had a PCR culture done at last clinic visit that grew coag negative staph and Candida parapsilosis. Keystone antibiotic ointment has been ordered. 11/27; patient presents for follow-up. She received the [facility] antibiotic ointment and started this 4 days ago. She states that the wound site is still very tender. She has been using an Ace wrap for compression as she cannot tolerate anything tighter. 12/4; patient presents for follow-up. She has been using Keystone antibiotic with Hydrofera Blue under Ace wrap. She states the chronic pain has improved. She brought in short-term disability papers to be filled out. 12/11; patient presents for follow-up. We have been using Keystone antibiotic ointment and Hydrofera Blue under Kerlix/Coban. This is the first time with a true compression wrap. She tolerated this well. She has no issues or complaints today. She was contacted by vein and vascular today for her venous reflux studies. She states she is going to call them back to schedule these. 12/18; patient presents for follow-up. We have been using Keystone antibiotic and  Hydrofera Blue under 3 layer compression. She tolerated the wrap well. She has no issues or complaints today. She states she has scheduled her venous reflux studies. 12/26; patient presents for follow-up. We have been using Keystone antibiotic with Hydrofera Blue under 4-layer compression. She tolerated the increase in compression well. She has no issues or complaints today. 1/2; patient presents for follow-up. We have been using Keystone antibiotic with collagen under 4-layer compression. She has tolerated this well. She would like to return back to  work. Patient History Information obtained from Patient, Chart. Family History Unknown History, Cancer - Father, Hypertension - Mother, Lung Disease - Father, Thyroid Problems - Mother,Siblings, No family history of Tuberculosis. Social History Current every day smoker - e-ciggs, Alcohol Use - Never, Drug Use - No History, Caffeine Use - Rarely. Medical History Cardiovascular Patient has history of Peripheral Venous Disease Hospitalization/Surgery History - IVC filter. - tubal ligation. Medical A Surgical History Notes nd Hematologic/Lymphatic DVT Musculoskeletal scoliosis GWENETTE, WELLONS ([id number]) [id number] Page 5 of 7 Objective Constitutional respirations regular, non-labored and within target range for patient.. Vitals Time Taken: 9:30 AM, Height: 65 in, Weight: 115 lbs, BMI: 19.1, Temperature: 99.1 F, Pulse: 114 bpm, Respiratory Rate: 20 breaths/min, Blood Pressure: 139/90 mmHg. Cardiovascular 2+ dorsalis pedis/posterior tibialis pulses. Psychiatric pleasant and cooperative. General Notes: Left lower extremity: T the distal medial aspect there is an open wound with granulation tissue throughout. No surrounding signs of infection. o Hemosiderin staining. Integumentary (Hair, Skin) Wound #1 status is Open. Original cause of wound was Laceration. The date acquired was: 04/16/2022. The wound has been  in treatment 7 weeks. The wound is located on the Left,Medial Lower Leg. The wound measures 1.9cm length x 1.5cm width x 0.1cm depth; 2.238cm^2 area and 0.224cm^3 volume. There is Fat Layer (Subcutaneous Tissue) exposed. There is no tunneling or undermining noted. There is a medium amount of serosanguineous drainage noted. The wound margin is distinct with the outline attached to the wound base. There is large (67-100%) red, friable granulation within the wound bed. There is no necrotic tissue within the wound bed. The periwound skin appearance exhibited: Scarring, Ecchymosis, Hemosiderin Staining. The periwound skin appearance did not exhibit: Callus, Crepitus, Excoriation, Induration, Rash, Dry/Scaly, Maceration, Atrophie Blanche, Cyanosis, Mottled, Pallor, Rubor, Erythema. 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 I recommended continuing the course with collagen And Keystone antibiotic ointment under 4-layer compression. She would like to return back to work. She can do so without restrictions. She knows to elevate her legs if she experiences increased swelling. Follow-up in 1 week. Procedures Wound #1 Pre-procedure diagnosis of Wound #1 is a Venous Leg Ulcer located on the Left,Medial Lower Leg . There was a Four Layer Compression Therapy Procedure by Shawn Stall, RN. Post procedure Diagnosis Wound #1: Same as Pre-Procedure Plan Follow-up Appointments: Return Appointment in 1 week. - w/ Dr. Mikey Bussing Return Appointment in 2 weeks. - Dr. Mikey Bussing Other: - Continue to bring in topical antibiotics at each appt time. ***May return to work full time with no restrictions.**** Anesthetic: (In clinic) Topical Lidocaine 5% applied to wound bed Bathing/ Shower/  Hygiene: May shower with protection but do not get wound dressing(s) wet. Protect dressing(s) with water repellant cover (for example, large plastic bag) or a cast cover and may then take shower. Edema Control - Lymphedema / SCD / Other: Avoid standing for long periods of time. WOUND #1: - Lower Leg Wound Laterality: Left, Medial Cleanser: Soap and Water 1 x Per Week/30 Days Discharge Instructions: May shower and wash wound with dial antibacterial soap and water prior to dressing change. Cleanser: Wound Cleanser (Generic) 1 x Per Week/30 Days Discharge Instructions: Cleanse the wound with wound cleanser prior to applying a clean dressing using gauze sponges, not tissue or cotton balls. Peri-Wound Care: Triamcinolone 15 (g) 1 x  Per Week/30 Days [name] Evans, [name] Evans ([id number]) [id number] Page 6 of 7 Discharge Instructions: Use triamcinolone 15 (g) as directed Peri-Wound Care: Sween Lotion (Moisturizing lotion) 1 x Per Week/30 Days Discharge Instructions: Apply moisturizing lotion as directed Topical: Keystone Compounding T opical antibiotics 1 x Per Week/30 Days Discharge Instructions: apply directly to wound bed. Prim Dressing: FIBRACOL Plus Dressing, 2x2 in (collagen) 1 x Per Week/30 Days ary Discharge Instructions: Moisten collagen with saline or hydrogel Secondary Dressing: ABD Pad, 5x9 (Generic) 1 x Per Week/30 Days Discharge Instructions: Apply over primary dressing as directed. Secondary Dressing: Woven Gauze Sponge, Non-Sterile 4x4 in (Generic) 1 x Per Week/30 Days Discharge Instructions: Apply over primary dressing as directed. Com pression Wrap: FourPress (4 layer compression wrap) 1 x Per Week/30 Days Discharge Instructions: Apply four layer compression. 1. Collagen With Keystone antibiotic ointment under 4-layer compressionooleft lower extremity 2. Know to return back to work 3. Follow-up in 1 week Electronic Signature(s) Signed: 11/17/2022 12:20:08  PM By: Kalman Shan DO Entered By: Kalman Shan on 11/17/2022 10:48:44 -------------------------------------------------------------------------------- HxROS Details Patient Name: Date of Service: [name] Evans, [name] Evans 11/17/2022 9:15 A M Medical Record Number: [id number] Patient Account Number: [id number] Date of Birth/Sex: Treating RN: 07/18/1976 (47 y.o. F) Primary Care Provider: PA Haig Prophet, NO Other Clinician: Referring Provider: Treating Provider/Extender: Yaakov Guthrie in Treatment: 7 Information Obtained From Patient Chart Hematologic/Lymphatic Medical History: Past Medical History Notes: DVT Cardiovascular Medical History: Positive for: Peripheral Venous Disease Musculoskeletal Medical History: Past Medical History Notes: scoliosis Immunizations Pneumococcal Vaccine: Received Pneumococcal Vaccination: No Implantable Devices None Hospitalization / Surgery History Type of Hospitalization/Surgery IVC filter tubal ligation Family and Social History Unknown History: Yes; Cancer: Yes - Father; Hypertension: Yes - Mother; Lung Disease: Yes - Father; Thyroid Problems: Yes - Mother,Siblings; Tuberculosis: No; Current every day smoker - e-ciggs; Alcohol Use: Never; Drug Use: No History; Caffeine Use: Rarely; Financial Concerns: No; [name] Evans, [name] Evans, [name] Evans ([id number]) [id number] Page 7 of 7 [name] Evans or Shelter Needs: No; Support System Lacking: No; Transportation Concerns: No Electronic Signature(s) Signed: 11/17/2022 12:20:08 PM By: Kalman Shan DO Entered By: Kalman Shan on 11/17/2022 10:45:23 -------------------------------------------------------------------------------- SuperBill Details Patient Name: Date of Service: MERRIAM, BRANDNER 11/17/2022 Medical Record Number: [id number] Patient Account Number: [id number] Date of Birth/Sex: Treating RN: [birth year] (47 y.o. [name] Primary Care Provider: PA TIENT, NO Other  Clinician: Referring Provider: Treating Provider/Extender: Yaakov Guthrie in Treatment: 7 Diagnosis Coding ICD-10 Codes Code Description [phone] Non-pressure chronic ulcer of other part of left lower leg with fat layer exposed I87.312 Chronic venous hypertension (idiopathic) with ulcer of left lower extremity Facility Procedures : CPT4 Code: [id number] Description: (Facility Use Only) 29581LT - [place] LWR LT LEG Modifier: Quantity: 1 Physician Procedures : CPT4 Code Description Modifier [id number] - WC PHYS LEVEL 3 - EST PT ICD-10 Diagnosis Description L97.822 Non-pressure chronic ulcer of other part of left lower leg with fat layer exposed I87.312 Chronic venous hypertension (idiopathic) with ulcer  of left lower extremity Quantity: 1 Electronic Signature(s) Signed: 11/17/2022 12:20:08 PM By: Kalman Shan DO Entered By: Kalman Shan on 11/17/2022 10:49:05

## 2022-11-18 NOTE — Progress Notes (Signed)
COBI, ALDAPE ([id number]) [id number] Page 1 of 8 Visit Report for 11/17/2022 Arrival Information Details Patient Name: Date of Service: [name] Evans, [name] Evans 11/17/2022 9:15 A M Medical Record Number: [id number] Patient Account Number: [id number] Date of Birth/Sex: Treating RN: August 31, 1976 (47 y.o. Helene Shoe, Meta.Reding Primary Care Melton Walls: PA TIENT, NO Other Clinician: Referring Amory Simonetti: Treating Sirus Labrie/Extender: Yaakov Guthrie in Treatment: 7 Visit Information History Since Last Visit Added or deleted any medications: No Patient Arrived: Ambulatory Any new allergies or adverse reactions: No Arrival Time: 09:24 Had a fall or experienced change in No Accompanied By: self activities of daily living that may affect Transfer Assistance: None risk of falls: Patient Identification Verified: Yes Signs or symptoms of abuse/neglect since last visito No Secondary Verification Process Completed: Yes Hospitalized since last visit: No Patient Requires Transmission-Based Precautions: No Implantable device outside of the clinic excluding No Patient Has Alerts: No cellular tissue based products placed in the center since last visit: Has Dressing in Place as Prescribed: Yes Has Compression in Place as Prescribed: Yes Pain Present Now: No Electronic Signature(s) Signed: 11/17/2022 5:53:54 PM By: Deon Pilling RN, BSN Entered By: Deon Pilling on 11/17/2022 09:24:22 -------------------------------------------------------------------------------- Compression Therapy Details Patient Name: Date of Service: [name] Evans, [name] Evans 11/17/2022 9:15 A M Medical Record Number: [id number] Patient Account Number: [id number] Date of Birth/Sex: Treating RN: February 04, 1976 (47 y.o. [name] Primary Care Rubel Heckard: PA Haig Prophet, [place] Other Clinician: Referring Lindy Garczynski: Treating Otha Rickles/Extender: Yaakov Guthrie in Treatment: 7 Compression Therapy Performed for Wound Assessment:  Wound #1 Left,Medial Lower Leg Performed By: Clinician Deon Pilling, RN Compression Type: Four Layer Post Procedure Diagnosis Same as Pre-procedure Electronic Signature(s) Signed: 11/17/2022 5:53:54 PM By: Deon Pilling RN, BSN Entered By: Deon Pilling on 11/17/2022 09:45:44 Haydon, [name] ([id number]) [id number] Page 2 of 8 -------------------------------------------------------------------------------- Encounter Discharge Information Details Patient Name: Date of Service: [name] Evans, [name] Evans 11/17/2022 9:15 A M Medical Record Number: [id number] Patient Account Number: [id number] Date of Birth/Sex: Treating RN: 05/24/1976 (47 y.o. [name] Primary Care Harolyn Cocker: PA Haig Prophet, NO Other Clinician: Referring Lonzo Saulter: Treating Ruby Logiudice/Extender: Yaakov Guthrie in Treatment: 7 Encounter Discharge Information Items Discharge Condition: Stable Ambulatory Status: Ambulatory Discharge Destination: Home Transportation: Private Auto Accompanied By: self Schedule Follow-up Appointment: Yes Clinical Summary of Care: Electronic Signature(s) Signed: 11/17/2022 5:53:54 PM By: Deon Pilling RN, BSN Entered By: Deon Pilling on 11/17/2022 09:46:59 -------------------------------------------------------------------------------- Lower Extremity Assessment Details Patient Name: Date of Service: [name] Evans, SPURRIER 11/17/2022 9:15 A M Medical Record Number: [id number] Patient Account Number: [id number] Date of Birth/Sex: Treating RN: 1976-09-03 (47 y.o. [name] Primary Care Neta Upadhyay: PA Haig Prophet, NO Other Clinician: Referring Tuan Tippin: Treating Vihana Kydd/Extender: Yaakov Guthrie in Treatment: 7 Edema Assessment Assessed: [Left: Yes] [Right: No] Edema: [Left: N] [Right: o] Calf Left: Right: Point of Measurement: 28 cm From Medial Instep 30.5 cm Ankle Left: Right: Point of Measurement: 10 cm From Medial Instep 20 cm Vascular  Assessment Pulses: Dorsalis Pedis Palpable: [Left:Yes] Electronic Signature(s) Signed: 11/17/2022 5:53:54 PM By: Deon Pilling RN, BSN Entered By: Deon Pilling on 11/17/2022 09:29:02 Multi Wound Chart Details -------------------------------------------------------------------------------- [name] Evans ([id number]) [id number] Page 3 of 8 Patient Name: Date of Service: [name] Evans, [name] Evans 11/17/2022 9:15 A M Medical Record Number: [id number] Patient Account Number: [id number] Date of Birth/Sex: Treating RN: [birth year] (47 y.o. F) Primary Care Kajuana Shareef: PA Haig Prophet, NO Other Clinician: Referring Asuncion Tapscott: Treating Othmar Ringer/Extender: Yaakov Guthrie in Treatment: 7 Vital Signs Height(in): 65 Pulse(bpm): 114 Weight(lbs): 115 Blood Pressure(mmHg): 139/90  Body Mass Index(BMI): 19.1 Temperature(F): 99.1 Respiratory Rate(breaths/min): 20 Wound Assessments Wound Number: 1 N/A N/A Photos: N/A N/A Left, Medial Lower Leg N/A N/A Wound Location: Laceration N/A N/A Wounding Event: Venous Leg Ulcer N/A N/A Primary Etiology: Peripheral Venous Disease N/A N/A Comorbid History: 04/16/2022 N/A N/A Date Acquired: 7 N/A N/A Weeks of Treatment: Open N/A N/A Wound Status: No N/A N/A Wound Recurrence: 1.9x1.5x0.1 N/A N/A Measurements L x W x D (cm) 2.238 N/A N/A A (cm) : rea 0.224 N/A N/A Volume (cm) : 89.80% N/A N/A % Reduction in A rea: 94.90% N/A N/A % Reduction in Volume: Full Thickness Without Exposed N/A N/A Classification: Support Structures Medium N/A N/A Exudate Amount: Serosanguineous N/A N/A Exudate Type: red, brown N/A N/A Exudate Color: Distinct, outline attached N/A N/A Wound Margin: Large (67-100%) N/A N/A Granulation Amount: Red, Friable N/A N/A Granulation Quality: None Present (0%) N/A N/A Necrotic Amount: Fat Layer (Subcutaneous Tissue): Yes N/A N/A Exposed Structures: Fascia: No Tendon: No Muscle: No Joint:  No Bone: No Medium (34-66%) N/A N/A Epithelialization: Scarring: Yes N/A N/A Periwound Skin Texture: Excoriation: No Induration: No Callus: No Crepitus: No Rash: No Maceration: No N/A N/A Periwound Skin Moisture: Dry/Scaly: No Ecchymosis: Yes N/A N/A Periwound Skin Color: Hemosiderin Staining: Yes Atrophie Blanche: No Cyanosis: No Erythema: No Mottled: No Pallor: No Rubor: No Compression Therapy N/A N/A Procedures Performed: Treatment Notes Wound #1 (Lower Leg) Wound Laterality: Left, Medial Cleanser Soap and Water Discharge Instruction: May shower and wash wound with dial antibacterial soap and water prior to dressing change. Wound Cleanser RAISHA, BRABENDER ([id number]) [id number] Page 4 of 8 Discharge Instruction: Cleanse the wound with wound cleanser prior to applying a clean dressing using gauze sponges, not tissue or cotton balls. Peri-Wound Care Triamcinolone 15 (g) Discharge Instruction: Use triamcinolone 15 (g) as directed Sween Lotion (Moisturizing lotion) Discharge Instruction: Apply moisturizing lotion as directed Topical Keystone Compounding T opical antibiotics Discharge Instruction: apply directly to wound bed. Primary Dressing FIBRACOL Plus Dressing, 2x2 in (collagen) Discharge Instruction: Moisten collagen with saline or hydrogel Secondary Dressing ABD Pad, 5x9 Discharge Instruction: Apply over primary dressing as directed. Woven Gauze Sponge, Non-Sterile 4x4 in Discharge Instruction: Apply over primary dressing as directed. Secured With Compression Wrap FourPress (4 layer compression wrap) Discharge Instruction: Apply four layer compression. Compression Stockings Add-Ons Electronic Signature(s) Signed: 11/17/2022 12:20:08 PM By: Geralyn Corwin DO Entered By: Geralyn Corwin on 11/17/2022 10:43:53 -------------------------------------------------------------------------------- Multi-Disciplinary Care Plan  Details Patient Name: Date of Service: [name] Evans, [name] Evans 11/17/2022 9:15 A M Medical Record Number: [id number] Patient Account Number: [id number] Date of Birth/Sex: Treating RN: September 18, 1976 (47 y.o. Arta Silence Primary Care Clearance Chenault: PA Zenovia Jordan, [place] Other Clinician: Referring Kealohilani Maiorino: Treating Pihu Basil/Extender: Tilda Franco in Treatment: 7 Active Inactive Wound/Skin Impairment Nursing Diagnoses: Impaired tissue integrity Knowledge deficit related to ulceration/compromised skin integrity Goals: Patient will have a decrease in wound volume by X% from date: (specify in notes) Date Initiated: 09/16/2022 Target Resolution Date: 12/11/2022 Goal Status: Active Patient/caregiver will verbalize understanding of skin care regimen Date Initiated: 09/16/2022 Target Resolution Date: 12/11/2022 Goal Status: Active Ulcer/skin breakdown will have a volume reduction of 30% by week 4 Date Initiated: 09/16/2022 Target Resolution Date: 11/14/2022 Goal Status: Active ROREY, HODGES ([id number]) [id number] Page 5 of 8 Interventions: Assess patient/caregiver ability to obtain necessary supplies Assess patient/caregiver ability to perform ulcer/skin care regimen upon admission and as needed Assess ulceration(s) every visit Notes: Electronic Signature(s) Signed: 11/17/2022 5:53:54 PM By: Shawn Stall RN, BSN Entered By: Elesa Hacker,  Bobbi on 11/17/2022 09:34:01 -------------------------------------------------------------------------------- Pain Assessment Details Patient Name: Date of Service: [name] Evans, [name] Evans 11/17/2022 9:15 A M Medical Record Number: [id number] Patient Account Number: [id number] Date of Birth/Sex: Treating RN: 1976-02-22 (47 y.o. Arta Silence Primary Care Kiegan Macaraeg: PA Zenovia Jordan, NO Other Clinician: Referring Laneya Gasaway: Treating Kolsen Choe/Extender: Tilda Franco in Treatment: 7 Active Problems Location of Pain Severity and Description of  Pain Patient Has Paino No Site Locations Rate the pain. Current Pain Level: 0 Pain Management and Medication Current Pain Management: Medication: No Cold Application: No Rest: No Massage: No Activity: No T.E.N.S.: No Heat Application: No Leg drop or elevation: No Is the Current Pain Management Adequate: Adequate How does your wound impact your activities of daily livingo Sleep: No Bathing: No Appetite: No Relationship With Others: No Bladder Continence: No Emotions: No Bowel Continence: No Work: No Toileting: No Drive: No Dressing: No Hobbies: No Electronic Signature(s) Signed: 11/17/2022 5:53:54 PM By: Shawn Stall RN, BSN Entered By: Shawn Stall on 11/17/2022 09:24:38 Hannum, [name] ([id number]) [id number] Page 6 of 8 -------------------------------------------------------------------------------- Patient/Caregiver Education Details Patient Name: Date of Service: [name] Evans, [name] Evans 1/2/2024andnbsp9:15 A M Medical Record Number: [id number] Patient Account Number: [id number] Date of Birth/Gender: Treating RN: 02-Apr-1976 (47 y.o. Arta Silence Primary Care Physician: PA Zenovia Jordan, [place] Other Clinician: Referring Physician: Treating Physician/Extender: Tilda Franco in Treatment: 7 Education Assessment Education Provided To: Patient Education Topics Provided Wound/Skin Impairment: Handouts: Smoking and Wound Healing Methods: Explain/Verbal Responses: Reinforcements needed Electronic Signature(s) Signed: 11/17/2022 5:53:54 PM By: Shawn Stall RN, BSN Entered By: Shawn Stall on 11/17/2022 09:34:11 -------------------------------------------------------------------------------- Wound Assessment Details Patient Name: Date of Service: [name] Evans, [name] Evans 11/17/2022 9:15 A M Medical Record Number: [id number] Patient Account Number: [id number] Date of Birth/Sex: Treating RN: Mar 28, 1976 (47 y.o. Arta Silence Primary Care Merrell Borsuk:  PA Zenovia Jordan, [place] Other Clinician: Referring Mileigh Tilley: Treating Goran Olden/Extender: Tilda Franco in Treatment: 7 Wound Status Wound Number: 1 Primary Etiology: Venous Leg Ulcer Wound Location: Left, Medial Lower Leg Wound Status: Open Wounding Event: Laceration Comorbid History: Peripheral Venous Disease Date Acquired: 04/16/2022 Weeks Of Treatment: 7 Clustered Wound: No Photos Wound Measurements Length: (cm) 1.9 Mase, Lauralye ([id number]) Width: (cm) Depth: (cm) Area: (cm) Volume: (cm) % Reduction in Area: 89.8% [id number] Page 7 of 8 1.5 % Reduction in Volume: 94.9% 0.1 Epithelialization: Medium (34-66%) 2.238 Tunneling: No 0.224 Undermining: No Wound Description Classification: Full Thickness Without Exposed Support Structures Wound Margin: Distinct, outline attached Exudate Amount: Medium Exudate Type: Serosanguineous Exudate Color: red, brown Foul Odor After Cleansing: No Slough/Fibrino No Wound Bed Granulation Amount: Large (67-100%) Exposed Structure Granulation Quality: Red, Friable Fascia Exposed: No Necrotic Amount: None Present (0%) Fat Layer (Subcutaneous Tissue) Exposed: Yes Tendon Exposed: No Muscle Exposed: No Joint Exposed: No Bone Exposed: No Periwound Skin Texture Texture Color No Abnormalities Noted: No No Abnormalities Noted: No Callus: No Atrophie Blanche: No Crepitus: No Cyanosis: No Excoriation: No Ecchymosis: Yes Induration: No Erythema: No Rash: No Hemosiderin Staining: Yes Scarring: Yes Mottled: No Pallor: No Moisture Rubor: No No Abnormalities Noted: No Dry / Scaly: No Maceration: No Treatment Notes Wound #1 (Lower Leg) Wound Laterality: Left, Medial Cleanser Soap and Water Discharge Instruction: May shower and wash wound with dial antibacterial soap and water prior to dressing change. Wound Cleanser Discharge Instruction: Cleanse the wound with wound cleanser prior to applying a  clean dressing using gauze sponges, not tissue or cotton balls. Peri-Wound Care Triamcinolone 15 (g) Discharge Instruction: Use triamcinolone 15 (g) as directed  Sween Lotion (Moisturizing lotion) Discharge Instruction: Apply moisturizing lotion as directed Topical Keystone Compounding T opical antibiotics Discharge Instruction: apply directly to wound bed. Primary Dressing FIBRACOL Plus Dressing, 2x2 in (collagen) Discharge Instruction: Moisten collagen with saline or hydrogel Secondary Dressing ABD Pad, 5x9 Discharge Instruction: Apply over primary dressing as directed. Woven Gauze Sponge, Non-Sterile 4x4 in Discharge Instruction: Apply over primary dressing as directed. Secured With Compression Wrap FourPress (4 layer compression wrap) Discharge Instruction: Apply four layer compression. Compression Stockings [name] Evans, [name] Evans ([id number]) [id number] Page 8 of 8 Add-Ons Electronic Signature(s) Signed: 11/17/2022 5:53:54 PM By: Deon Pilling RN, BSN Entered By: Deon Pilling on 11/17/2022 09:31:22 -------------------------------------------------------------------------------- Vitals Details Patient Name: Date of Service: [name] Evans, [name] Evans 11/17/2022 9:15 A M Medical Record Number: [id number] Patient Account Number: [id number] Date of Birth/Sex: Treating RN: 11-17-1975 (47 y.o. Helene Shoe, Meta.Reding Primary Care Craige Patel: PA TIENT, NO Other Clinician: Referring Kamden Reber: Treating Zenda Herskowitz/Extender: Yaakov Guthrie in Treatment: 7 Vital Signs Time Taken: 09:30 Temperature (F): 99.1 Height (in): 65 Pulse (bpm): 114 Weight (lbs): 115 Respiratory Rate (breaths/min): 20 Body Mass Index (BMI): 19.1 Blood Pressure (mmHg): 139/90 Reference Range: 80 - 120 mg / dl Electronic Signature(s) Signed: 11/17/2022 5:53:54 PM By: Deon Pilling RN, BSN Entered By: Deon Pilling on 11/17/2022 09:32:23

## 2022-11-20 ENCOUNTER — Telehealth (HOSPITAL_COMMUNITY): Payer: Self-pay

## 2022-11-20 NOTE — Telephone Encounter (Signed)
Attempted to contact patient for scheudling ultrasound ordered by Dr.Hoffman, I have been unable to contact patient, after multiple attempts. 10/26/22, 10/30/22, 11/12/22.   Attempted to contact office this morning to inform unable to contact patient for scheduling.

## 2022-11-23 ENCOUNTER — Encounter (HOSPITAL_BASED_OUTPATIENT_CLINIC_OR_DEPARTMENT_OTHER): Payer: BC Managed Care – PPO | Admitting: Internal Medicine

## 2022-11-23 DIAGNOSIS — L97822 Non-pressure chronic ulcer of other part of left lower leg with fat layer exposed: Secondary | ICD-10-CM | POA: Diagnosis not present

## 2022-11-23 DIAGNOSIS — I87312 Chronic venous hypertension (idiopathic) with ulcer of left lower extremity: Secondary | ICD-10-CM

## 2022-11-23 NOTE — Progress Notes (Signed)
LAKASHA, MCFALL ([id number]) [id number] Page 1 of 7 Visit Report for 11/23/2022 Arrival Information Details Patient Name: Date of Service: [name] Evans, [name] Evans 11/23/2022 9:15 A M Medical Record Number: [id number] Patient Account Number: [id number] Date of Birth/Sex: Treating RN: 1976-07-19 (47 y.o. F) Primary Care Kenedee Molesky: PA TIENT, NO Other Clinician: Referring Montavius Subramaniam: Treating Janiyla Long/Extender: Tilda Franco in Treatment: 8 Visit Information History Since Last Visit All ordered tests and consults were completed: No Patient Arrived: Ambulatory Added or deleted any medications: No Arrival Time: 09:32 Any new allergies or adverse reactions: No Accompanied By: self Had a fall or experienced change in No Transfer Assistance: None activities of daily living that may affect Patient Identification Verified: Yes risk of falls: Secondary Verification Process Completed: Yes Signs or symptoms of abuse/neglect since last visito No Patient Requires Transmission-Based Precautions: No Hospitalized since last visit: No Patient Has Alerts: No Implantable device outside of the clinic excluding No cellular tissue based products placed in the center since last visit: Pain Present Now: No Electronic Signature(s) Signed: 11/23/2022 9:54:25 AM By: Dayton Scrape Entered By: Dayton Scrape on 11/23/2022 09:33:14 -------------------------------------------------------------------------------- Compression Therapy Details Patient Name: Date of Service: [name] Evans, [name] Evans 11/23/2022 9:15 A M Medical Record Number: [id number] Patient Account Number: [id number] Date of Birth/Sex: Treating RN: 1976-08-04 (47 y.o. Toniann Fail Primary Care Abeera Flannery: PA Zenovia Jordan, NO Other Clinician: Referring Tyniah Kastens: Treating Rakeen Gaillard/Extender: Tilda Franco in Treatment: 8 Compression Therapy Performed for Wound Assessment: Wound #1 Left,Medial Lower Leg Performed By: Clinician  Fonnie Mu, RN Compression Type: Four Layer Post Procedure Diagnosis Same as Pre-procedure Electronic Signature(s) Signed: 11/23/2022 4:22:55 PM By: Fonnie Mu RN Entered By: Fonnie Mu on 11/23/2022 10:16:15 Bunyard, [name] ([id number]) [id number] Page 2 of 7 -------------------------------------------------------------------------------- Encounter Discharge Information Details Patient Name: Date of Service: [name] Evans, [name] Evans 11/23/2022 9:15 A M Medical Record Number: [id number] Patient Account Number: [id number] Date of Birth/Sex: Treating RN: [birth year] (47 y.o. [name], Lauren Primary Care Karam Dunson: PA Zenovia Jordan, NO Other Clinician: Referring Emylee Decelle: Treating Cassandre Oleksy/Extender: Tilda Franco in Treatment: 8 Encounter Discharge Information Items Discharge Condition: Stable Ambulatory Status: Ambulatory Discharge Destination: Home Transportation: Private Auto Accompanied By: self Schedule Follow-up Appointment: Yes Clinical Summary of Care: Patient Declined Electronic Signature(s) Signed: 11/23/2022 4:22:55 PM By: Fonnie Mu RN Entered By: Fonnie Mu on 11/23/2022 10:16:49 -------------------------------------------------------------------------------- Lower Extremity Assessment Details Patient Name: Date of Service: [name] Evans, [name] Evans 11/23/2022 9:15 A M Medical Record Number: [id number] Patient Account Number: [id number] Date of Birth/Sex: Treating RN: 1976-03-01 (47 y.o. [name], Lauren Primary Care Donnice Nielsen: PA Zenovia Jordan, NO Other Clinician: Referring Derward Marple: Treating Brittie Whisnant/Extender: Tilda Franco in Treatment: 8 Edema Assessment Assessed: [Left: Yes] [Right: No] Edema: [Left: N] [Right: o] Calf Left: Right: Point of Measurement: 28 cm From Medial Instep 30.5 cm Ankle Left: Right: Point of Measurement: 10 cm From Medial Instep 20 cm Vascular Assessment Pulses: Dorsalis  Pedis Palpable: [Left:Yes] Posterior Tibial Palpable: [Left:Yes] Electronic Signature(s) Signed: 11/23/2022 4:22:55 PM By: Fonnie Mu RN Entered By: Fonnie Mu on 11/23/2022 09:51:41 Thoma, [name] ([id number]) [id number] Page 3 of 7 -------------------------------------------------------------------------------- Multi Wound Chart Details Patient Name: Date of Service: [name] Evans, [name] Evans 11/23/2022 9:15 A M Medical Record Number: [id number] Patient Account Number: [id number] Date of Birth/Sex: Treating RN: [birth year] (47 y.o. F) Primary Care Rosaire Cueto: PA TIENT, NO Other Clinician: Referring Nephi Savage: Treating Zeidy Tayag/Extender: Tilda Franco in Treatment: 8 Vital Signs Height(in): 65 Pulse(bpm): 97 Weight(lbs): 115 Blood Pressure(mmHg): 143/87 Body Mass Index(BMI): 19.1 Temperature(F): 98.3 Respiratory  Rate(breaths/min): 20 [1:Photos:] [N/A:N/A] Left, Medial Lower Leg N/A N/A Wound Location: Laceration N/A N/A Wounding Event: Venous Leg Ulcer N/A N/A Primary Etiology: Peripheral Venous Disease N/A N/A Comorbid History: 04/16/2022 N/A N/A Date Acquired: 8 N/A N/A Weeks of Treatment: Open N/A N/A Wound Status: No N/A N/A Wound Recurrence: 3.5x2x0.1 N/A N/A Measurements L x W x D (cm) 5.498 N/A N/A A (cm) : rea 0.55 N/A N/A Volume (cm) : 75.00% N/A N/A % Reduction in A rea: 87.50% N/A N/A % Reduction in Volume: Full Thickness Without Exposed N/A N/A Classification: Support Structures Medium N/A N/A Exudate Amount: Serosanguineous N/A N/A Exudate Type: red, brown N/A N/A Exudate Color: Distinct, outline attached N/A N/A Wound Margin: Large (67-100%) N/A N/A Granulation Amount: Red, Friable N/A N/A Granulation Quality: None Present (0%) N/A N/A Necrotic Amount: Fat Layer (Subcutaneous Tissue): Yes N/A N/A Exposed Structures: Fascia: No Tendon: No Muscle: No Joint: No Bone: No Medium (34-66%) N/A  N/A Epithelialization: Scarring: Yes N/A N/A Periwound Skin Texture: Excoriation: No Induration: No Callus: No Crepitus: No Rash: No Maceration: No N/A N/A Periwound Skin Moisture: Dry/Scaly: No Ecchymosis: Yes N/A N/A Periwound Skin Color: Hemosiderin Staining: Yes Atrophie Blanche: No Cyanosis: No Erythema: No Mottled: No Pallor: No Rubor: No Treatment Notes Electronic Signature(s) Signed: 11/23/2022 12:33:43 PM By: Kalman Shan DO Entered By: Kalman Shan on 11/23/2022 10:05:14 Scholze, [name] ([id number]) [id number] Page 4 of 7 -------------------------------------------------------------------------------- Multi-Disciplinary Care Plan Details Patient Name: Date of Service: [name] Evans, [name] Evans 11/23/2022 9:15 A M Medical Record Number: [id number] Patient Account Number: [id number] Date of Birth/Sex: Treating RN: [birth year] (47 y.o. Tonita Phoenix, Lauren Primary Care Zamyia Gowell: PA Haig Prophet, NO Other Clinician: Referring Esdras Delair: Treating Aspen Lawrance/Extender: Yaakov Guthrie in Treatment: 8 Active Inactive Wound/Skin Impairment Nursing Diagnoses: Impaired tissue integrity Knowledge deficit related to ulceration/compromised skin integrity Goals: Patient will have a decrease in wound volume by X% from date: (specify in notes) Date Initiated: 09/16/2022 Target Resolution Date: 12/11/2022 Goal Status: Active Patient/caregiver will verbalize understanding of skin care regimen Date Initiated: 09/16/2022 Target Resolution Date: 12/11/2022 Goal Status: Active Ulcer/skin breakdown will have a volume reduction of 30% by week 4 Date Initiated: 09/16/2022 Target Resolution Date: 12/12/2022 Goal Status: Active Interventions: Assess patient/caregiver ability to obtain necessary supplies Assess patient/caregiver ability to perform ulcer/skin care regimen upon admission and as needed Assess ulceration(s) every visit Notes: Electronic  Signature(s) Signed: 11/23/2022 4:22:55 PM By: Rhae Hammock RN Entered By: Rhae Hammock on 11/23/2022 09:40:12 -------------------------------------------------------------------------------- Pain Assessment Details Patient Name: Date of Service: [name] Evans, [name] Evans 11/23/2022 9:15 A M Medical Record Number: [id number] Patient Account Number: [id number] Date of Birth/Sex: Treating RN: Apr 05, 1976 (47 y.o. F) Primary Care Edwards Mckelvie: PA Haig Prophet, NO Other Clinician: Referring Savva Beamer: Treating Cormick Moss/Extender: Yaakov Guthrie in Treatment: 8 Active Problems Location of Pain Severity and Description of Pain Patient Has Paino No Site Locations [facility], [name] ([id number]) [id number] Page 5 of 7 Pain Management and Medication Current Pain Management: Electronic Signature(s) Signed: 11/23/2022 9:54:25 AM By: Worthy Rancher Entered By: Worthy Rancher on 11/23/2022 09:33:45 -------------------------------------------------------------------------------- Patient/Caregiver Education Details Patient Name: Date of Service: [name] Evans 1/8/2024andnbsp9:15 [place] Record Number: [id number] Patient Account Number: [id number] Date of Birth/Gender: Treating RN: 08/19/1976 (47 y.o. Tonita Phoenix, Lauren Primary Care Physician: PA Haig Prophet, NO Other Clinician: Referring Physician: Treating Physician/Extender: Yaakov Guthrie in Treatment: 8 Education Assessment Education Provided To: Patient Education Topics Provided Wound/Skin Impairment: Methods: Explain/Verbal Responses: Reinforcements needed, State content correctly Electronic Signature(s) Signed: 11/23/2022 4:22:55 PM  By: Fonnie Mu RN Entered By: Fonnie Mu on 11/23/2022 09:40:22 -------------------------------------------------------------------------------- Wound Assessment Details Patient Name: Date of Service: [name] Evans, [name] Evans 11/23/2022 9:15 A M Medical Record Number:  [id number] Patient Account Number: [id number] Date of Birth/Sex: Treating RN: [birth year] (47 y.o. F) Primary Care Sundance Moise: PA Zenovia Jordan, NO Other Clinician: Referring Hersh Minney: Treating Sidra Oldfield/Extender: Nellene, Courtois, [name] ([id number]) [id number] Page 6 of 7 Weeks in Treatment: 8 Wound Status Wound Number: 1 Primary Etiology: Venous Leg Ulcer Wound Location: Left, Medial Lower Leg Wound Status: Open Wounding Event: Laceration Comorbid History: Peripheral Venous Disease Date Acquired: 04/16/2022 Weeks Of Treatment: 8 Clustered Wound: No Photos Wound Measurements Length: (cm) 3.5 Width: (cm) 2 Depth: (cm) 0.1 Area: (cm) 5.498 Volume: (cm) 0.55 % Reduction in Area: 75% % Reduction in Volume: 87.5% Epithelialization: Medium (34-66%) Tunneling: No Undermining: No Wound Description Classification: Full Thickness Without Exposed Support Structures Wound Margin: Distinct, outline attached Exudate Amount: Medium Exudate Type: Serosanguineous Exudate Color: red, brown Foul Odor After Cleansing: No Slough/Fibrino No Wound Bed Granulation Amount: Large (67-100%) Exposed Structure Granulation Quality: Red, Friable Fascia Exposed: No Necrotic Amount: None Present (0%) Fat Layer (Subcutaneous Tissue) Exposed: Yes Tendon Exposed: No Muscle Exposed: No Joint Exposed: No Bone Exposed: No Periwound Skin Texture Texture Color No Abnormalities Noted: No No Abnormalities Noted: No Callus: No Atrophie Blanche: No Crepitus: No Cyanosis: No Excoriation: No Ecchymosis: Yes Induration: No Erythema: No Rash: No Hemosiderin Staining: Yes Scarring: Yes Mottled: No Pallor: No Moisture Rubor: No No Abnormalities Noted: No Dry / Scaly: No Maceration: No Treatment Notes Wound #1 (Lower Leg) Wound Laterality: Left, Medial Cleanser Soap and Water Discharge Instruction: May shower and wash wound with dial antibacterial soap and water prior to  dressing change. Wound Cleanser Discharge Instruction: Cleanse the wound with wound cleanser prior to applying a clean dressing using gauze sponges, not tissue or cotton balls. Peri-Wound Care Triamcinolone 15 (g) [name] Evans, [name] Evans ([id number]) [id number] Page 7 of 7 Discharge Instruction: Use triamcinolone 15 (g) as directed Sween Lotion (Moisturizing lotion) Discharge Instruction: Apply moisturizing lotion as directed Topical Keystone Compounding T opical antibiotics Discharge Instruction: apply directly to wound bed. Primary Dressing FIBRACOL Plus Dressing, 2x2 in (collagen) Discharge Instruction: Moisten collagen with saline or hydrogel Secondary Dressing ABD Pad, 5x9 Discharge Instruction: Apply over primary dressing as directed. Woven Gauze Sponge, Non-Sterile 4x4 in Discharge Instruction: Apply over primary dressing as directed. Secured With Compression Wrap FourPress (4 layer compression wrap) Discharge Instruction: Apply four layer compression. Compression Stockings Add-Ons Electronic Signature(s) Signed: 11/23/2022 4:22:55 PM By: Fonnie Mu RN Entered By: Fonnie Mu on 11/23/2022 09:50:16 -------------------------------------------------------------------------------- Vitals Details Patient Name: Date of Service: [name] Evans, [name] Evans 11/23/2022 9:15 A M Medical Record Number: [id number] Patient Account Number: [id number] Date of Birth/Sex: Treating RN: May 02, 1976 (47 y.o. F) Primary Care Meygan Kyser: PA TIENT, NO Other Clinician: Referring Waver Dibiasio: Treating Telvin Reinders/Extender: Tilda Franco in Treatment: 8 Vital Signs Time Taken: 09:33 Temperature (F): 98.3 Height (in): 65 Pulse (bpm): 97 Weight (lbs): 115 Respiratory Rate (breaths/min): 20 Body Mass Index (BMI): 19.1 Blood Pressure (mmHg): 143/87 Reference Range: 80 - 120 mg / dl Electronic Signature(s) Signed: 11/23/2022 9:54:25 AM By: Dayton Scrape Entered By: Dayton Scrape on 11/23/2022 09:33:38

## 2022-11-23 NOTE — Progress Notes (Signed)
[name] Evans, [name] Evans ([id number]) [id number] Page 1 of 7 Visit Report for 11/23/2022 Chief Complaint Document Details Patient Name: Date of Service: [name] Evans, [name] Evans 11/23/2022 9:15 A M Medical Record Number: [id number] Patient Account Number: [id number] Date of Birth/Sex: Treating RN: 1976/01/04 (47 y.o. F) Primary Care Provider: PA Haig Prophet, NO Other Clinician: Referring Provider: Treating Provider/Extender: Yaakov Guthrie in Treatment: 8 Information Obtained from: Patient Chief Complaint 09/28/2022; left lower extremity wound Electronic Signature(s) Signed: 11/23/2022 12:33:43 PM By: Kalman Shan DO Entered By: Kalman Shan on 11/23/2022 10:05:33 -------------------------------------------------------------------------------- HPI Details Patient Name: Date of Service: [name] Evans, [name] Evans 11/23/2022 9:15 A M Medical Record Number: [id number] Patient Account Number: [id number] Date of Birth/Sex: Treating RN: [birth year] (47 y.o. F) Primary Care Provider: PA Haig Prophet, NO Other Clinician: Referring Provider: Treating Provider/Extender: Yaakov Guthrie in Treatment: 8 History of Present Illness HPI Description: Admission 09/28/2022 Ms. 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 She states that her mother had a history of venous insufficiency with wounds as well. She has been using bacitracin to the wound bed. She has chronic pain to the area. She was seen in the ED for this issue on 10/21 and 10/24. She completed a 10-day course of doxycycline and Keflex for this issue. She states she works at [employer] and is on her feet for over 12 hours a day. She is not using compression therapy. She currently denies systemic signs of infection. 11/20; patient presents for follow-up.  She has been using Medihoney and Hydrofera Blue to the wound bed. She reports improvement in pain however still has tenderness to the wound sites. She states that she has been elevating her leg and using an Ace bandage. She has been out of work for the past week. She had a PCR culture done at last clinic visit that grew coag negative staph and Candida parapsilosis. Keystone antibiotic ointment has been ordered. 11/27; patient presents for follow-up. She received the [facility] antibiotic ointment and started this 4 days ago. She states that the wound site is still very tender. She has been using an Ace wrap for compression as she cannot tolerate anything tighter. 12/4; patient presents for follow-up. She has been using Keystone antibiotic with Hydrofera Blue under Ace wrap. She states the chronic pain has improved. She brought in short-term disability papers to be filled out. 12/11; patient presents for follow-up. We have been using Keystone antibiotic ointment and Hydrofera Blue under Kerlix/Coban. This is the first time with a true compression wrap. She tolerated this well. She has no issues or complaints today. She was contacted by vein and vascular today for her venous reflux studies. She states she is going to call them back to schedule these. 12/18; patient presents for follow-up. We have been using Keystone antibiotic and Hydrofera Blue under 3 layer compression. She tolerated the wrap well. She has no issues or complaints today. She states she has scheduled her venous reflux studies. 12/26; patient presents for follow-up. We have been using Keystone antibiotic with Hydrofera Blue under 4-layer compression. She tolerated the increase in compression well. She has no issues or complaints today. 1/2; patient presents for follow-up. We have been using Keystone antibiotic with collagen under 4-layer compression. She has tolerated this well. She would like to return back to work. 1/8; patient presents  for follow-up. We have been using Keystone antibiotic ointment with collagen under 4-layer compression. She has no issues or complaints [name], [name] Evans ([id number]) [id number] Page 2 of 7 today. She does not have the [facility] antibiotic ointment with her today. She has returned to work and reports no issues with this. Electronic Signature(s) Signed: 11/23/2022 12:33:43 PM By: Geralyn Corwin DO Entered By: Geralyn Corwin on 11/23/2022 10:06:04 -------------------------------------------------------------------------------- Physical Exam Details Patient Name: Date of Service: [name] Evans, [name] Evans 11/23/2022 9:15 A M Medical Record Number: [id number] Patient Account Number: [id number] Date of Birth/Sex: Treating RN: 1976-05-04 (47 y.o. F) Primary Care Provider: PA Zenovia Jordan, NO Other Clinician: Referring Provider: Treating Provider/Extender: Tilda Franco in Treatment: 8 Constitutional respirations regular, non-labored and within target range for patient.. Cardiovascular 2+ dorsalis pedis/posterior tibialis pulses. Psychiatric pleasant and cooperative. Notes Left lower extremity: T the distal medial aspect there is an open wound with granulation tissue throughout. No surrounding signs of infection. Hemosiderin o staining. Electronic Signature(s) Signed: 11/23/2022 12:33:43 PM By: Geralyn Corwin DO Entered By: Geralyn Corwin on 11/23/2022 10:12:22 -------------------------------------------------------------------------------- Physician Orders Details Patient Name: Date of Service: [name], Evans 11/23/2022 9:15 A M Medical Record Number: [id number] Patient Account Number: [id number] Date of Birth/Sex: Treating RN: 04-08-1976 (47 y.o. [name] Evans, [name] Evans Primary Care Provider: PA Zenovia Jordan, NO Other Clinician: Referring Provider: Treating Provider/Extender: Tilda Franco in Treatment: 8 Verbal / Phone Orders: No Diagnosis Coding Follow-up  Appointments ppointment in 1 week. - w/ Dr. Mikey Bussing Return A ppointment in 2 weeks. - Dr. Mikey Bussing Return A Other: - Continue to bring in topical antibiotics at each appt time. ***May return to work full time with no restrictions.**** Anesthetic (In clinic) Topical Lidocaine 5% applied to wound bed Bathing/ Shower/ Hygiene May shower with protection but do not get wound dressing(s) wet. Protect dressing(s) with water repellant cover (for example, large plastic bag) or a cast cover and may then take shower. Edema Control - Lymphedema / SCD / Other Vrba, Feleica ([id number]) [id number] Page 3 of 7 Avoid standing for long periods of time. Wound Treatment Wound #1 - Lower Leg Wound Laterality: Left, Medial Cleanser: Soap and Water 1 x Per Week/30 Days Discharge Instructions: May shower and wash wound with dial antibacterial soap and water prior to dressing change. Cleanser: Wound Cleanser (Generic) 1 x Per Week/30 Days Discharge Instructions: Cleanse the wound with wound cleanser prior to applying a clean dressing using gauze sponges, not tissue or cotton balls. Peri-Wound Care: Triamcinolone 15 (g) 1 x Per Week/30 Days Discharge Instructions: Use triamcinolone 15 (g) as directed Peri-Wound Care: Sween Lotion (Moisturizing lotion) 1 x Per Week/30 Days Discharge Instructions: Apply moisturizing lotion as directed Topical: Keystone Compounding T opical antibiotics 1 x Per Week/30 Days Discharge Instructions: apply directly to wound bed. Prim Dressing: FIBRACOL Plus Dressing, 2x2 in (collagen) 1 x Per Week/30 Days ary Discharge Instructions: Moisten collagen with saline or hydrogel Secondary Dressing: ABD Pad, 5x9 (Generic) 1 x Per Week/30 Days Discharge Instructions: Apply over primary dressing as directed. Secondary Dressing: Woven Gauze Sponge, Non-Sterile 4x4 in (Generic) 1 x Per Week/30 Days Discharge Instructions: Apply over primary dressing as  directed. Compression Wrap: FourPress (4 layer compression wrap) 1 x Per Week/30 Days Discharge Instructions: Apply four layer compression. Electronic Signature(s) Signed: 11/23/2022 12:33:43 PM By: Geralyn Corwin DO Entered By: Geralyn Corwin on 11/23/2022 10:12:33 -------------------------------------------------------------------------------- Problem List Details Patient Name: Date of Service: [name] Evans, [name] Evans 11/23/2022 9:15 A M Medical Record Number: [id number] Patient Account Number: [id number]  Date of Birth/Sex: Treating RN: 09/21/1976 (47 y.o. 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/28/2022 No Yes exposed I87.312 Chronic venous hypertension (idiopathic) with ulcer of left lower extremity 09/28/2022 No Yes Inactive Problems Resolved Problems Electronic Signature(s) Signed: 11/23/2022 12:33:43 PM By: Dyanne Carrel [id number] By: Kalman Shan DO [id number] Page 4 of 7 Signed: 11/23/2022 12:33:43 Entered By: Kalman Shan on 11/23/2022 10:05:07 -------------------------------------------------------------------------------- Progress Note Details Patient Name: Date of Service: [name] Evans, [name] Evans 11/23/2022 9:15 A M Medical Record Number: [id number] Patient Account Number: [id number] Date of Birth/Sex: Treating RN: Apr 03, 1976 (47 y.o. F) Primary Care Provider: PA Haig Prophet, NO Other Clinician: Referring Provider: Treating Provider/Extender: Yaakov Guthrie in Treatment: 8 Subjective Chief Complaint Information obtained from Patient 09/28/2022; left lower extremity wound History of Present Illness (HPI) Admission 09/28/2022 Ms. 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 She states that her mother had a history of venous insufficiency with wounds as well. She has been using bacitracin to the wound bed. She has chronic pain to the area. She was seen in the ED for this issue on 10/21 and 10/24. She completed a 10-day course of doxycycline and Keflex for this issue. She states she works at [employer] and is on her feet for over 12 hours a day. She is not using compression therapy. She currently denies systemic signs of infection. 11/20; patient presents for follow-up. She has been using Medihoney and Hydrofera Blue to the wound bed. She reports improvement in pain however still has tenderness to the wound sites. She states that she has been elevating her leg and using an Ace bandage. She has been out of work for the past week. She had a PCR culture done at last clinic visit that grew coag negative staph and Candida parapsilosis. Keystone antibiotic ointment has been ordered. 11/27; patient presents for follow-up. She received the [facility] antibiotic ointment and started this 4 days ago. She states that the wound site is still very tender. She has been using an Ace wrap for compression as she cannot tolerate anything tighter. 12/4; patient presents for follow-up. She has been using Keystone antibiotic with Hydrofera Blue under Ace wrap. She states the chronic pain has improved. She brought in short-term disability papers to be filled out. 12/11; patient presents for follow-up. We have been using Keystone antibiotic ointment and Hydrofera Blue under Kerlix/Coban. This is the first time with a true compression wrap. She tolerated this well. She has no issues or complaints today. She was contacted by vein and vascular today for her venous reflux studies. She states she is  going to call them back to schedule these. 12/18; patient presents for follow-up. We have been using Keystone antibiotic and Hydrofera Blue under 3 layer compression. She tolerated the wrap well. She has no issues or complaints today. She states she has scheduled her venous reflux studies. 12/26; patient presents for follow-up. We have been using Keystone antibiotic with Hydrofera Blue under 4-layer compression. She tolerated the increase in compression well. She has no issues or complaints today. 1/2; patient presents for follow-up. We have  been using Keystone antibiotic with collagen under 4-layer compression. She has tolerated this well. She would like to return back to work. 1/8; patient presents for follow-up. We have been using Keystone antibiotic ointment with collagen under 4-layer compression. She has no issues or complaints today. She does not have the [facility] antibiotic ointment with her today. She has returned to work and reports no issues with this. Patient History Information obtained from Patient, Chart. Family History Unknown History, Cancer - Father, Hypertension - Mother, Lung Disease - Father, Thyroid Problems - Mother,Siblings, No family history of Tuberculosis. Social History Current every day smoker - e-ciggs, Alcohol Use - Never, Drug Use - No History, Caffeine Use - Rarely. Medical History Cardiovascular Patient has history of Peripheral Venous Disease Hospitalization/Surgery History - IVC filter. - tubal ligation. Medical A Surgical History Notes nd Hematologic/Lymphatic DVT Musculoskeletal scoliosis [name] Evans, [name] Evans ([id number]) [id number] Page 5 of 7 Objective Constitutional respirations regular, non-labored and within target range for patient.. Vitals Time Taken: 9:33 AM, Height: 65 in, Weight: 115 lbs, BMI: 19.1, Temperature: 98.3 F, Pulse: 97 bpm, Respiratory Rate: 20 breaths/min, Blood Pressure: 143/87  mmHg. Cardiovascular 2+ dorsalis pedis/posterior tibialis pulses. Psychiatric pleasant and cooperative. General Notes: Left lower extremity: T the distal medial aspect there is an open wound with granulation tissue throughout. No surrounding signs of infection. o Hemosiderin staining. Integumentary (Hair, Skin) Wound #1 status is Open. Original cause of wound was Laceration. The date acquired was: 04/16/2022. The wound has been in treatment 8 weeks. The wound is located on the Left,Medial Lower Leg. The wound measures 3.5cm length x 2cm width x 0.1cm depth; 5.498cm^2 area and 0.55cm^3 volume. There is Fat Layer (Subcutaneous Tissue) exposed. There is no tunneling or undermining noted. There is a medium amount of serosanguineous drainage noted. The wound margin is distinct with the outline attached to the wound base. There is large (67-100%) red, friable granulation within the wound bed. There is no necrotic tissue within the wound bed. The periwound skin appearance exhibited: Scarring, Ecchymosis, Hemosiderin Staining. The periwound skin appearance did not exhibit: Callus, Crepitus, Excoriation, Induration, Rash, Dry/Scaly, Maceration, Atrophie Blanche, Cyanosis, Mottled, Pallor, Rubor, Erythema. 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 She does not have her Keystone antibiotic ointment with her today. I recommended continuing with collagen under 4- layer compression for this week. She is back at work and is on her feet more. It does not appear that this is affecting her wound healing. Follow-up in 1 week. Plan Follow-up Appointments: Return Appointment in 1 week. - w/ Dr. Heber [place] Return Appointment in 2 weeks. - Dr. Heber [place] Other: - Continue to bring in topical antibiotics at each appt time. ***May return  to work full time with no restrictions.**** Anesthetic: (In clinic) Topical Lidocaine 5% applied to wound bed Bathing/ Shower/ Hygiene: May shower with protection but do not get wound dressing(s) wet. Protect dressing(s) with water repellant cover (for example, large plastic bag) or a cast cover and may then take shower. Edema Control - Lymphedema / SCD / Other: Avoid standing for long periods of time. WOUND #1: - Lower Leg Wound Laterality: Left, Medial Cleanser: Soap and Water 1 x Per Week/30 Days Discharge Instructions: May shower and wash wound with dial antibacterial soap and water prior to dressing change. Cleanser: Wound Cleanser (Generic) 1 x Per Week/30 Days Discharge Instructions: Cleanse the wound  with wound cleanser prior to applying a clean dressing using gauze sponges, not tissue or cotton balls. Peri-Wound Care: Triamcinolone 15 (g) 1 x Per Week/30 Days Discharge Instructions: Use triamcinolone 15 (g) as directed Peri-Wound Care: Sween Lotion (Moisturizing lotion) 1 x Per Week/30 Days Discharge Instructions: Apply moisturizing lotion as directed Topical: Keystone Compounding T opical antibiotics 1 x Per Week/30 Days Discharge Instructions: apply directly to wound bed. Prim Dressing: FIBRACOL Plus Dressing, 2x2 in (collagen) 1 x Per Week/30 Days ary Discharge Instructions: Moisten collagen with saline or hydrogel Secondary Dressing: ABD Pad, 5x9 (Generic) 1 x Per Week/30 Days Discharge Instructions: Apply over primary dressing as directed. Secondary Dressing: Woven Gauze Sponge, Non-Sterile 4x4 in (Generic) 1 x Per Week/30 Days Discharge Instructions: Apply over primary dressing as directed. Com pression Wrap: FourPress (4 layer compression wrap) 1 x Per Week/30 Days Discharge Instructions: Apply four layer compression. [name] Evans, [name] Evans ([id number]) [id number] Page 6 of 7 1. Collagen under 4-layer compressionooleft lower extremity 2. Follow-up  in 1 week Electronic Signature(s) Signed: 11/23/2022 12:33:43 PM By: Kalman Shan DO Entered By: Kalman Shan on 11/23/2022 10:14:41 -------------------------------------------------------------------------------- HxROS Details Patient Name: Date of Service: [name] Evans, [name] Evans 11/23/2022 9:15 A M Medical Record Number: [id number] Patient Account Number: [id number] Date of Birth/Sex: Treating RN: [birth year] (47 y.o. F) Primary Care Provider: PA Haig Prophet, NO Other Clinician: Referring Provider: Treating Provider/Extender: Yaakov Guthrie in Treatment: 8 Information Obtained From Patient Chart Hematologic/Lymphatic Medical History: Past Medical History Notes: DVT Cardiovascular Medical History: Positive for: Peripheral Venous Disease Musculoskeletal Medical History: Past Medical History Notes: scoliosis Immunizations Pneumococcal Vaccine: Received Pneumococcal Vaccination: No Implantable Devices None Hospitalization / Surgery History Type of Hospitalization/Surgery IVC filter tubal ligation Family and Social History Unknown History: Yes; Cancer: Yes - Father; Hypertension: Yes - Mother; Lung Disease: Yes - Father; Thyroid Problems: Yes - Mother,Siblings; Tuberculosis: No; Current every day smoker - e-ciggs; Alcohol Use: Never; Drug Use: No History; Caffeine Use: Rarely; Financial Concerns: No; Food, Clothing or Shelter Needs: No; Support System Lacking: No; Transportation Concerns: No Electronic Signature(s) Signed: 11/23/2022 12:33:43 PM By: Kalman Shan DO Entered By: Kalman Shan on 11/23/2022 10:10:42 Evans, [name] Bers ([id number]) [id number] Page 7 of 7 -------------------------------------------------------------------------------- SuperBill Details Patient Name: Date of Service: [name] Evans, [name] Evans 11/23/2022 Medical Record Number: [id number] Patient Account Number: [id number] Date of Birth/Sex: Treating RN: [birth year] (47 y.o.  F) Primary Care Provider: PA TIENT, NO Other Clinician: Referring Provider: Treating Provider/Extender: Yaakov Guthrie in Treatment: 8 Diagnosis Coding ICD-10 Codes Code Description [phone] Non-pressure chronic ulcer of other part of left lower leg with fat layer exposed I87.312 Chronic venous hypertension (idiopathic) with ulcer of left lower extremity Physician Procedures : CPT4 Code Description Modifier [id number] - WC PHYS LEVEL 3 - EST PT ICD-10 Diagnosis Description L97.822 Non-pressure chronic ulcer of other part of left lower leg with fat layer exposed I87.312 Chronic venous hypertension (idiopathic) with ulcer  of left lower extremity Quantity: 1 Electronic Signature(s) Signed: 11/23/2022 12:33:43 PM By: Kalman Shan DO Entered By: Kalman Shan on 11/23/2022 10:17:15

## 2022-11-30 ENCOUNTER — Encounter (HOSPITAL_BASED_OUTPATIENT_CLINIC_OR_DEPARTMENT_OTHER): Payer: BC Managed Care – PPO | Admitting: Internal Medicine

## 2022-11-30 DIAGNOSIS — I87312 Chronic venous hypertension (idiopathic) with ulcer of left lower extremity: Secondary | ICD-10-CM | POA: Diagnosis not present

## 2022-12-07 ENCOUNTER — Encounter (HOSPITAL_BASED_OUTPATIENT_CLINIC_OR_DEPARTMENT_OTHER): Payer: BC Managed Care – PPO | Admitting: Internal Medicine

## 2022-12-07 DIAGNOSIS — I87312 Chronic venous hypertension (idiopathic) with ulcer of left lower extremity: Secondary | ICD-10-CM

## 2022-12-07 DIAGNOSIS — L97822 Non-pressure chronic ulcer of other part of left lower leg with fat layer exposed: Secondary | ICD-10-CM

## 2022-12-09 NOTE — Progress Notes (Signed)
[name] Evans, [name] Evans ([id number]) [id number] Page 1 of 8 Visit Report for 12/07/2022 Chief Complaint Document Details Patient Name: Date of Service: [name] Evans, [name] Evans 12/07/2022 10:15 A M Medical Record Number: [id number] Patient Account Number: [id number] Date of Birth/Sex: Treating RN: 01/12/1976 (47 y.o. F) Primary Care Provider: PA Zenovia JordanIENT, NO Other Clinician: Referring Provider: Treating Provider/Extender: Tilda FrancoHoffman, Rafia Shedden Weeks in Treatment: 10 Information Obtained from: Patient Chief Complaint 09/28/2022; left lower extremity wound Electronic Signature(s) Signed: 12/07/2022 12:02:25 PM By: Geralyn CorwinHoffman, Myron Lona DO Entered By: Geralyn CorwinHoffman, Garlen Reinig on 12/07/2022 11:08:17 -------------------------------------------------------------------------------- Debridement Details Patient Name: Date of Service: [name] Evans, [name] Evans 12/07/2022 10:15 A M Medical Record Number: [id number] Patient Account Number: [id number] Date of Birth/Sex: Treating RN: 06/29/1976 (47 y.o. Debara PickettF) Deaton, Millard.LoaBobbi Primary Care Provider: PA TIENT, NO Other Clinician: Referring Provider: Treating Provider/Extender: Tilda FrancoHoffman, Jules Vidovich Weeks in Treatment: 10 Debridement Performed for Assessment: Wound #1 Left,Medial Lower Leg Performed By: Physician Geralyn CorwinHoffman, Auda Finfrock, DO Debridement Type: Debridement Severity of Tissue Pre Debridement: Fat layer exposed Level of Consciousness (Pre-procedure): Awake and Alert Pre-procedure Verification/Time Out Yes - 11:00 Taken: Start Time: 11:01 Pain Control: Lidocaine 4% T opical Solution T Area Debrided (L x W): otal 2 (cm) x 1 (cm) = 2 (cm) Tissue and other material debrided: Viable, Non-Viable, Slough, Subcutaneous, Skin: Dermis , Skin: Epidermis, Slough Level: Skin/Subcutaneous Tissue Debridement Description: Excisional Instrument: Curette Bleeding: Minimum Hemostasis Achieved: Pressure End Time: 11:04 Procedural Pain: 0 Post Procedural Pain: 0 Response to  Treatment: Procedure was tolerated well Level of Consciousness (Post- Awake and Alert procedure): Post Debridement Measurements of Total Wound Length: (cm) 1.8 Width: (cm) 0.5 Depth: (cm) 0.1 Volume: (cm) 0.071 Character of Wound/Ulcer Post Debridement: Improved Severity of Tissue Post Debridement: Fat layer exposed [name] Evans ([id number]) [id number] Page 2 of 8 Post Procedure Diagnosis Same as Pre-procedure Electronic Signature(s) Signed: 12/07/2022 12:02:25 PM By: Geralyn CorwinHoffman, Oakleigh Hesketh DO Signed: 12/08/2022 6:38:55 PM By: Shawn Stalleaton, Bobbi RN, BSN Entered By: Shawn Stalleaton, Bobbi on 12/07/2022 11:04:21 -------------------------------------------------------------------------------- HPI Details Patient Name: Date of Service: [name] Evans, [name] Evans 12/07/2022 10:15 A M Medical Record Number: [id number] Patient Account Number: [id number] Date of Birth/Sex: Treating RN: 07/01/1976 (47 y.o. F) Primary Care Provider: PA Zenovia JordanIENT, NO Other Clinician: Referring Provider: Treating Provider/Extender: Tilda FrancoHoffman, Klea Nall Weeks in Treatment: 10 History of Present Illness HPI Description: Admission 09/28/2022 Ms. 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 She states that her mother had a history of venous insufficiency with wounds as well. She has been using bacitracin to the wound bed. She has chronic pain to the area. She was seen in the ED for this issue on 10/21 and 10/24. She completed a 10-day course of doxycycline and Keflex for this issue. She states she works at [employer] and is on her feet for over 12 hours a day. She is not using compression therapy. She currently denies systemic signs of infection. 11/20; patient presents for follow-up. She has been using Medihoney and Hydrofera Blue to the  wound bed. She reports improvement in pain however still has tenderness to the wound sites. She states that she has been elevating her leg and using an Ace bandage. She has been out of work for the past week. She had a PCR culture done at last clinic visit that grew coag negative staph and Candida parapsilosis. Keystone antibiotic ointment has  been ordered. 11/27; patient presents for follow-up. She received the [facility] antibiotic ointment and started this 4 days ago. She states that the wound site is still very tender. She has been using an Ace wrap for compression as she cannot tolerate anything tighter. 12/4; patient presents for follow-up. She has been using Keystone antibiotic with Hydrofera Blue under Ace wrap. She states the chronic pain has improved. She brought in short-term disability papers to be filled out. 12/11; patient presents for follow-up. We have been using Keystone antibiotic ointment and Hydrofera Blue under Kerlix/Coban. This is the first time with a true compression wrap. She tolerated this well. She has no issues or complaints today. She was contacted by vein and vascular today for her venous reflux studies. She states she is going to call them back to schedule these. 12/18; patient presents for follow-up. We have been using Keystone antibiotic and Hydrofera Blue under 3 layer compression. She tolerated the wrap well. She has no issues or complaints today. She states she has scheduled her venous reflux studies. 12/26; patient presents for follow-up. We have been using Keystone antibiotic with Hydrofera Blue under 4-layer compression. She tolerated the increase in compression well. She has no issues or complaints today. 1/2; patient presents for follow-up. We have been using Keystone antibiotic with collagen under 4-layer compression. She has tolerated this well. She would like to return back to work. 1/8; patient presents for follow-up. We have been using Keystone antibiotic  ointment with collagen under 4-layer compression. She has no issues or complaints today. She does not have the [facility] antibiotic ointment with her today. She has returned to work and reports no issues with this. 1/15; patient presents for follow-up left lower leg wound using Keystone and collagen under 4-layer compression. Patient with significant chronic venous insufficiency. She also tells me she has a history of DVT during a pregnancy several years ago at which time she had an IVC filter which has since been removed. 1/22; patient presents for follow-up. We have been using Prisma under 4-layer compression to the left lower extremity. She has no issues or complaints today. There is been improvement in wound healing. Electronic Signature(s) Signed: 12/07/2022 12:02:25 PM By: Geralyn Corwin DO Entered By: Geralyn Corwin on 12/07/2022 11:08:51 [name] Miyamoto ([id number]) [id number] Page 3 of 8 -------------------------------------------------------------------------------- Physical Exam Details Patient Name: Date of Service: [name] Evans, [name] Evans 12/07/2022 10:15 A M Medical Record Number: [id number] Patient Account Number: [id number] Date of Birth/Sex: Treating RN: 1976-01-11 (47 y.o. F) Primary Care Provider: PA Zenovia Jordan, NO Other Clinician: Referring Provider: Treating Provider/Extender: Tilda Franco in Treatment: 10 Constitutional respirations regular, non-labored and within target range for patient.. Cardiovascular 2+ dorsalis pedis/posterior tibialis pulses. Psychiatric pleasant and cooperative. Notes Left lower extremity: Good edema control. Hemosiderin [name] Evans on the medial left lower leg. Open wound with granulation tissue, nonviable tissue and hyper granulated areas. No signs of infection. Electronic Signature(s) Signed: 12/07/2022 12:02:25 PM By: Geralyn Corwin DO Entered By: Geralyn Corwin on 12/07/2022  11:09:48 -------------------------------------------------------------------------------- Physician Orders Details Patient Name: Date of Service: EMILYGRACE, GROTHE 12/07/2022 10:15 A M Medical Record Number: [id number] Patient Account Number: [id number] Date of Birth/Sex: Treating RN: June 22, 1976 (47 y.o. Arta Silence Primary Care Provider: PA Zenovia Jordan, [place] Other Clinician: Referring Provider: Treating Provider/Extender: Tilda Franco in Treatment: 10 Verbal / Phone Orders: No Diagnosis Coding ICD-10 Coding Code Description [phone] Non-pressure chronic ulcer of other part of left lower leg with fat layer exposed I87.312 Chronic venous hypertension (  idiopathic) with ulcer of left lower extremity Follow-up Appointments ppointment in 1 week. - w/ Dr. Mikey Bussing Monday 12/14/2022 0930 Return A ppointment in 2 weeks. - Dr. Mikey Bussing 12/21/2022 0800 Monday Return A Anesthetic (In clinic) Topical Lidocaine 5% applied to wound bed Bathing/ Shower/ Hygiene May shower with protection but do not get wound dressing(s) wet. Protect dressing(s) with water repellant cover (for example, large plastic bag) or a cast cover and may then take shower. Edema Control - Lymphedema / SCD / Other Avoid standing for long periods of time. Wound Treatment Wound #1 - Lower Leg Wound Laterality: Left, Medial Cleanser: Soap and Water 1 x Per Week/30 Days Discharge Instructions: May shower and wash wound with dial antibacterial soap and water prior to dressing change. Cleanser: Wound Cleanser (Generic) 1 x Per Week/30 Days Discharge Instructions: Cleanse the wound with wound cleanser prior to applying a clean dressing using gauze sponges, not tissue or cotton balls. Peri-Wound Care: Triamcinolone 15 (g) 1 x Per Week/30 Days JOHARA, LODWICK ([id number]) [phone].pdf Page 4 of 8 Discharge Instructions: Use triamcinolone 15 (g) as directed Peri-Wound Care: Sween Lotion (Moisturizing  lotion) 1 x Per Week/30 Days Discharge Instructions: Apply moisturizing lotion as directed Prim Dressing: Promogran Prisma Matrix, 4.34 (sq in) (silver collagen) 1 x Per Week/30 Days ary Discharge Instructions: Moisten collagen with saline or hydrogel Secondary Dressing: Woven Gauze Sponge, Non-Sterile 4x4 in (Generic) 1 x Per Week/30 Days Discharge Instructions: Apply over primary dressing as directed. Compression Wrap: FourPress (4 layer compression wrap) 1 x Per Week/30 Days Discharge Instructions: Apply four layer compression. Electronic Signature(s) Signed: 12/07/2022 12:02:25 PM By: Geralyn Corwin DO Entered By: Geralyn Corwin on 12/07/2022 11:09:55 -------------------------------------------------------------------------------- Problem List Details Patient Name: Date of Service: [name] Evans, [name] Evans 12/07/2022 10:15 A M Medical Record Number: [id number] Patient Account Number: [id number] Date of Birth/Sex: Treating RN: 1975-12-18 (47 y.o. Arta Silence Primary Care Provider: PA Zenovia Jordan, [place] Other Clinician: Referring Provider: Treating Provider/Extender: Tilda Franco in Treatment: 10 Active Problems ICD-10 Encounter Code Description Active Date MDM Diagnosis L97.822 Non-pressure chronic ulcer of other part of left lower leg with fat layer 09/28/2022 No Yes exposed I87.312 Chronic venous hypertension (idiopathic) with ulcer of left lower extremity 09/28/2022 No Yes Inactive Problems Resolved Problems Electronic Signature(s) Signed: 12/07/2022 12:02:25 PM By: Geralyn Corwin DO Entered By: Geralyn Corwin on 12/07/2022 11:07:57 -------------------------------------------------------------------------------- Progress Note Details Patient Name: Date of Service: [name] Evans, [name] Evans 12/07/2022 10:15 A M Medical Record Number: [id number] Patient Account Number: [id number] Date of Birth/Sex: Treating RN: 1976-10-31 (47 y.o. F) Primary Care Provider: PA Fidela Juneau Other  ClinicianLADAN, [name] Evans ([id number]) [id number] Page 5 of 8 Referring Provider: Treating Provider/Extender: Tilda Franco in Treatment: 10 Subjective Chief Complaint Information obtained from Patient 09/28/2022; left lower extremity wound History of Present Illness (HPI) Admission 09/28/2022 Ms. 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 She states that her mother had a history of venous insufficiency with wounds as well. She has been using bacitracin to the wound bed. She has chronic pain to the area. She was seen in the ED for this issue on 10/21 and 10/24. She completed a 10-day course of doxycycline and Keflex for this issue. She states she works at [employer] and is on her feet for over 12 hours a day. She  is not using compression therapy. She currently denies systemic signs of infection. 11/20; patient presents for follow-up. She has been using Medihoney and Hydrofera Blue to the wound bed. She reports improvement in pain however still has tenderness to the wound sites. She states that she has been elevating her leg and using an Ace bandage. She has been out of work for the past week. She had a PCR culture done at last clinic visit that grew coag negative staph and Candida parapsilosis. Keystone antibiotic ointment has been ordered. 11/27; patient presents for follow-up. She received the [facility] antibiotic ointment and started this 4 days ago. She states that the wound site is still very tender. She has been using an Ace wrap for compression as she cannot tolerate anything tighter. 12/4; patient presents for follow-up. She has been using Keystone antibiotic with Hydrofera Blue under Ace wrap. She states the chronic pain has improved. She brought in short-term  disability papers to be filled out. 12/11; patient presents for follow-up. We have been using Keystone antibiotic ointment and Hydrofera Blue under Kerlix/Coban. This is the first time with a true compression wrap. She tolerated this well. She has no issues or complaints today. She was contacted by vein and vascular today for her venous reflux studies. She states she is going to call them back to schedule these. 12/18; patient presents for follow-up. We have been using Keystone antibiotic and Hydrofera Blue under 3 layer compression. She tolerated the wrap well. She has no issues or complaints today. She states she has scheduled her venous reflux studies. 12/26; patient presents for follow-up. We have been using Keystone antibiotic with Hydrofera Blue under 4-layer compression. She tolerated the increase in compression well. She has no issues or complaints today. 1/2; patient presents for follow-up. We have been using Keystone antibiotic with collagen under 4-layer compression. She has tolerated this well. She would like to return back to work. 1/8; patient presents for follow-up. We have been using Keystone antibiotic ointment with collagen under 4-layer compression. She has no issues or complaints today. She does not have the [facility] antibiotic ointment with her today. She has returned to work and reports no issues with this. 1/15; patient presents for follow-up left lower leg wound using Keystone and collagen under 4-layer compression. Patient with significant chronic venous insufficiency. She also tells me she has a history of DVT during a pregnancy several years ago at which time she had an IVC filter which has since been removed. 1/22; patient presents for follow-up. We have been using Prisma under 4-layer compression to the left lower extremity. She has no issues or complaints today. There is been improvement in wound healing. Patient History Information obtained from Patient,  Chart. Family History Unknown History, Cancer - Father, Hypertension - Mother, Lung Disease - Father, Thyroid Problems - Mother,Siblings, No family history of Tuberculosis. Social History Current every day smoker - e-ciggs, Alcohol Use - Never, Drug Use - No History, Caffeine Use - Rarely. Medical History Cardiovascular Patient has history of Peripheral Venous Disease Hospitalization/Surgery History - IVC filter. - tubal ligation. Medical A Surgical History Notes nd Hematologic/Lymphatic DVT Musculoskeletal scoliosis Objective Constitutional respirations regular, non-labored and within target range for patient.Marland Kitchen NATSUMI, WHITSITT ([id number]) [id number] Page 6 of 8 Vitals Time Taken: 10:28 AM, Height: 65 in, Weight: 115 lbs, BMI: 19.1, Temperature: 98.3 F, Pulse: 67 bpm, Respiratory Rate: 18 breaths/min, Blood Pressure: 167/82 mmHg. Cardiovascular 2+ dorsalis pedis/posterior tibialis pulses. Psychiatric pleasant and cooperative. General Notes: Left lower extremity: Good  edema control. Hemosiderin [name] Evans on the medial left lower leg. Open wound with granulation tissue, nonviable tissue and hyper granulated areas. No signs of infection. Integumentary (Hair, Skin) Wound #1 status is Open. Original cause of wound was Laceration. The date acquired was: 04/16/2022. The wound has been in treatment 10 weeks. The wound is located on the Left,Medial Lower Leg. The wound measures 1.8cm length x 0.5cm width x 0.1cm depth; 0.707cm^2 area and 0.071cm^3 volume. There is Fat Layer (Subcutaneous Tissue) exposed. There is no tunneling or undermining noted. There is a medium amount of serosanguineous drainage noted. The wound margin is distinct with the outline attached to the wound base. There is large (67-100%) red granulation within the wound bed. There is no necrotic tissue within the wound bed. The periwound skin appearance exhibited: Scarring, Ecchymosis,  Hemosiderin Staining. The periwound skin appearance did not exhibit: Callus, Crepitus, Excoriation, Induration, Rash, Dry/Scaly, Maceration, Atrophie Blanche, Cyanosis, Mottled, Pallor, Rubor, Erythema. Assessment Active Problems ICD-10 Non-pressure chronic ulcer of other part of left lower leg with fat layer exposed Chronic venous hypertension (idiopathic) with ulcer of left lower extremity Patient's wound has shown improvement in size since last clinic visit. Area appears well-healing. I debrided nonviable tissue And use silver nitrate to the hyper granulated areas. I recommended continuing the course with collagen under 4-layer compression to the left lower extremity. Follow-up in 1 week. Procedures Wound #1 Pre-procedure diagnosis of Wound #1 is a Venous Leg Ulcer located on the Left,Medial Lower Leg .Severity of Tissue Pre Debridement is: Fat layer exposed. There was a Excisional Skin/Subcutaneous Tissue Debridement with a total area of 2 sq cm performed by Geralyn Corwin, DO. With the following instrument(s): Curette to remove Viable and Non-Viable tissue/material. Material removed includes Subcutaneous Tissue, Slough, Skin: Dermis, and Skin: Epidermis after achieving pain control using Lidocaine 4% Topical Solution. A time out was conducted at 11:00, prior to the start of the procedure. A Minimum amount of bleeding was controlled with Pressure. The procedure was tolerated well with a pain level of 0 throughout and a pain level of 0 following the procedure. Post Debridement Measurements: 1.8cm length x 0.5cm width x 0.1cm depth; 0.071cm^3 volume. Character of Wound/Ulcer Post Debridement is improved. Severity of Tissue Post Debridement is: Fat layer exposed. Post procedure Diagnosis Wound #1: Same as Pre-Procedure Pre-procedure diagnosis of Wound #1 is a Venous Leg Ulcer located on the Left,Medial Lower Leg . There was a Four Layer Compression Therapy Procedure by Shawn Stall, RN. Post  procedure Diagnosis Wound #1: Same as Pre-Procedure Plan Follow-up Appointments: Return Appointment in 1 week. - w/ Dr. Mikey Bussing Monday 12/14/2022 0930 Return Appointment in 2 weeks. - Dr. Mikey Bussing 12/21/2022 0800 Monday Anesthetic: (In clinic) Topical Lidocaine 5% applied to wound bed Bathing/ Shower/ Hygiene: May shower with protection but do not get wound dressing(s) wet. Protect dressing(s) with water repellant cover (for example, large plastic bag) or a cast cover and may then take shower. Edema Control - Lymphedema / SCD / Other: Avoid standing for long periods of time. WOUND #1: - Lower Leg Wound Laterality: Left, Medial Cleanser: Soap and Water 1 x Per Week/30 Days Discharge Instructions: May shower and wash wound with dial antibacterial soap and water prior to dressing change. Cleanser: Wound Cleanser (Generic) 1 x Per Week/30 Days Discharge Instructions: Cleanse the wound with wound cleanser prior to applying a clean dressing using gauze sponges, not tissue or cotton balls. Peri-Wound Care: Triamcinolone 15 (g) 1 x Per Week/30 Days Discharge Instructions: Use  triamcinolone 15 (g) as directed Peri-Wound Care: Sween Lotion (Moisturizing lotion) 1 x Per Week/30 Days Discharge Instructions: Apply moisturizing lotion as directed Prim Dressing: Promogran Prisma Matrix, 4.34 (sq in) (silver collagen) 1 x Per Week/30 Days ary Discharge Instructions: Moisten collagen with saline or hydrogel Secondary Dressing: Woven Gauze Sponge, Non-Sterile 4x4 in (Generic) 1 x Per Week/30 Days Rylee, [name] Evans ([id number]) [id number] Page 7 of 8 Discharge Instructions: Apply over primary dressing as directed. Compression Wrap: FourPress (4 layer compression wrap) 1 x Per Week/30 Days Discharge Instructions: Apply four layer compression. 1. In office sharp debridement 2. Collagen under 4-layer compressionooleft lower extremity 3. Silver nitrate 4. Follow-up in 1  week Electronic Signature(s) Signed: 12/07/2022 12:02:25 PM By: Kalman Shan DO Entered By: Kalman Shan on 12/07/2022 11:12:30 -------------------------------------------------------------------------------- HxROS Details Patient Name: Date of Service: [name] Evans, [name] Evans 12/07/2022 10:15 A M Medical Record Number: [id number] Patient Account Number: [id number] Date of Birth/Sex: Treating RN: February 03, 1976 (47 y.o. F) Primary Care Provider: PA Haig Prophet, NO Other Clinician: Referring Provider: Treating Provider/Extender: Yaakov Guthrie in Treatment: 10 Information Obtained From Patient Chart Hematologic/Lymphatic Medical History: Past Medical History Notes: DVT Cardiovascular Medical History: Positive for: Peripheral Venous Disease Musculoskeletal Medical History: Past Medical History Notes: scoliosis Immunizations Pneumococcal Vaccine: Received Pneumococcal Vaccination: No Implantable Devices None Hospitalization / Surgery History Type of Hospitalization/Surgery IVC filter tubal ligation Family and Social History Unknown History: Yes; Cancer: Yes - Father; Hypertension: Yes - Mother; Lung Disease: Yes - Father; Thyroid Problems: Yes - Mother,Siblings; Tuberculosis: No; Current every day smoker - e-ciggs; Alcohol Use: Never; Drug Use: No History; Caffeine Use: Rarely; Financial Concerns: No; Food, Clothing or Shelter Needs: No; Support System Lacking: No; Transportation Concerns: No Electronic Signature(s) Signed: 12/07/2022 12:02:25 PM By: Kalman Shan DO Entered By: Kalman Shan on 12/07/2022 11:09:11 [name] Evans, [name] Evans ([id number]) [id number] Page 8 of 8 -------------------------------------------------------------------------------- SuperBill Details Patient Name: Date of Service: [name] Evans, [name] Evans 12/07/2022 Medical Record Number: [id number] Patient Account Number: [id number] Date of Birth/Sex: Treating RN: 18-Sep-1976 (47  y.o. Helene Shoe, Meta.Reding Primary Care Provider: PA TIENT, NO Other Clinician: Referring Provider: Treating Provider/Extender: Yaakov Guthrie in Treatment: 10 Diagnosis Coding ICD-10 Codes Code Description [phone] Non-pressure chronic ulcer of other part of left lower leg with fat layer exposed I87.312 Chronic venous hypertension (idiopathic) with ulcer of left lower extremity Facility Procedures : CPT4 Code: [id number] Description: 15176 - DEB SUBQ TISSUE 20 SQ CM/< ICD-10 Diagnosis Description L97.822 Non-pressure chronic ulcer of other part of left lower leg with fat layer expo I87.312 Chronic venous hypertension (idiopathic) with ulcer of left lower extremity Modifier: sed Quantity: 1 Physician Procedures : CPT4 Code Description Modifier [id number] - WC PHYS SUBQ TISS 20 SQ CM ICD-10 Diagnosis Description L97.822 Non-pressure chronic ulcer of other part of left lower leg with fat layer exposed I87.312 Chronic venous hypertension (idiopathic) with ulcer  of left lower extremity Quantity: 1 Electronic Signature(s) Signed: 12/07/2022 12:02:25 PM By: Kalman Shan DO Entered By: Kalman Shan on 12/07/2022 11:12:42

## 2022-12-09 NOTE — Progress Notes (Signed)
[name] Evans ([id number]) [id number] Page 1 of 8 Visit Report for 12/07/2022 Arrival Information Details Patient Name: Date of Service: [name] Evans, [name] Evans 12/07/2022 10:15 A M Medical Record Number: [id number] Patient Account Number: [id number] Date of Birth/Sex: Treating RN: 1976/05/05 (47 y.o. F) Primary Care Beverley Sherrard: PA TIENT, NO Other Clinician: Referring Tiwanna Tuch: Treating Cayne Yom/Extender: Yaakov Guthrie in Treatment: 10 Visit Information History Since Last Visit Added or deleted any medications: No Patient Arrived: Ambulatory Any new allergies or adverse reactions: No Arrival Time: 10:27 Had a fall or experienced change in No Accompanied By: self activities of daily living that may affect Transfer Assistance: None risk of falls: Patient Identification Verified: Yes Signs or symptoms of abuse/neglect since last visito No Secondary Verification Process Completed: Yes Hospitalized since last visit: No Patient Requires Transmission-Based Precautions: No Implantable device outside of the clinic excluding No Patient Has Alerts: No cellular tissue based products placed in the center since last visit: Has Compression in Place as Prescribed: Yes Pain Present Now: No Electronic Signature(s) Signed: 12/07/2022 4:49:41 PM By: Erenest Blank Entered By: Erenest Blank on 12/07/2022 10:28:06 -------------------------------------------------------------------------------- Compression Therapy Details Patient Name: Date of Service: [name] Evans, [name] Evans 12/07/2022 10:15 A M Medical Record Number: [id number] Patient Account Number: [id number] Date of Birth/Sex: Treating RN: [birth year] (47 y.o. [name] Primary Care Mora Pedraza: PA Haig Prophet, [place] Other Clinician: Referring Elease Swarm: Treating Zenith Kercheval/Extender: Yaakov Guthrie in Treatment: 10 Compression Therapy Performed for Wound Assessment: Wound #1 Left,Medial Lower Leg Performed By:  Clinician Deon Pilling, RN Compression Type: Four Layer Post Procedure Diagnosis Same as Pre-procedure Electronic Signature(s) Signed: 12/08/2022 6:38:55 PM By: Deon Pilling RN, BSN Entered By: Deon Pilling on 12/07/2022 11:04:56 [name], [name] Evans ([id number]) [id number] Page 2 of 8 -------------------------------------------------------------------------------- Encounter Discharge Information Details Patient Name: Date of Service: [name] Evans, [name] Evans 12/07/2022 10:15 A M Medical Record Number: [id number] Patient Account Number: [id number] Date of Birth/Sex: Treating RN: May 29, 1976 (47 y.o. [name] Primary Care Gussie Murton: PA Haig Prophet, NO Other Clinician: Referring Kevona Lupinacci: Treating Sylvester Salonga/Extender: Yaakov Guthrie in Treatment: 10 Encounter Discharge Information Items Post Procedure Vitals Discharge Condition: Stable Temperature (F): 98.3 Ambulatory Status: Ambulatory Pulse (bpm): 67 Discharge Destination: Home Respiratory Rate (breaths/min): 18 Transportation: Private Auto Blood Pressure (mmHg): 167/82 Accompanied By: self Schedule Follow-up Appointment: Yes Clinical Summary of Care: Electronic Signature(s) Signed: 12/08/2022 6:38:55 PM By: Deon Pilling RN, BSN Entered By: Deon Pilling on 12/07/2022 11:08:01 -------------------------------------------------------------------------------- Lower Extremity Assessment Details Patient Name: Date of Service: [name] Evans, [name] Evans 12/07/2022 10:15 A M Medical Record Number: [id number] Patient Account Number: [id number] Date of Birth/Sex: Treating RN: March 01, 1976 (47 y.o. F) Primary Care Aladdin Kollmann: PA Haig Prophet, NO Other Clinician: Referring Rickelle Sylvestre: Treating Waunita Sandstrom/Extender: Yaakov Guthrie in Treatment: 10 Edema Assessment Assessed: [Left: No] [Right: No] Edema: [Left: N] [Right: o] Calf Left: Right: Point of Measurement: 28 cm From Medial Instep 32 cm Ankle Left: Right: Point of  Measurement: 10 cm From Medial Instep 21.5 cm Electronic Signature(s) Signed: 12/07/2022 4:49:41 PM By: Erenest Blank Entered By: Erenest Blank on 12/07/2022 10:45:41 -------------------------------------------------------------------------------- Multi Wound Chart Details Patient Name: Date of Service: [name] Evans, [name] Evans 12/07/2022 10:15 A M Medical Record Number: [id number] Patient Account Number: [id number] Date of Birth/Sex: Treating RN: [birth year] (46 y.o. F) Primary Care Ethel Meisenheimer: PA Haig Prophet, NO Other Clinician: Referring Jazlyn Tippens: Treating Azharia Surratt/Extender: Yaakov Guthrie in Treatment: [address] [name] Evans ([id number]) [id number] Page 3 of 8 Vital Signs Height(in): 65 Pulse(bpm): 67 Weight(lbs): 115 Blood Pressure(mmHg): 167/82 Body Mass Index(BMI): 19.1 Temperature(F):  98.3 Respiratory Rate(breaths/min): 18 [1:Photos:] [N/A:N/A] Left, Medial Lower Leg N/A N/A Wound Location: Laceration N/A N/A Wounding Event: Venous Leg Ulcer N/A N/A Primary Etiology: Peripheral Venous Disease N/A N/A Comorbid History: 04/16/2022 N/A N/A Date Acquired: 10 N/A N/A Weeks of Treatment: Open N/A N/A Wound Status: No N/A N/A Wound Recurrence: 1.8x0.5x0.1 N/A N/A Measurements L x W x D (cm) 0.707 N/A N/A A (cm) : rea 0.071 N/A N/A Volume (cm) : 96.80% N/A N/A % Reduction in A rea: 98.40% N/A N/A % Reduction in Volume: Full Thickness Without Exposed N/A N/A Classification: Support Structures Medium N/A N/A Exudate A mount: Serosanguineous N/A N/A Exudate Type: red, brown N/A N/A Exudate Color: Distinct, outline attached N/A N/A Wound Margin: Large (67-100%) N/A N/A Granulation A mount: Red N/A N/A Granulation Quality: None Present (0%) N/A N/A Necrotic A mount: Fat Layer (Subcutaneous Tissue): Yes N/A N/A Exposed Structures: Fascia: No Tendon: No Muscle: No Joint: No Bone: No Medium (34-66%) N/A  N/A Epithelialization: Debridement - Excisional N/A N/A Debridement: Pre-procedure Verification/Time Out 11:00 N/A N/A Taken: Lidocaine 4% Topical Solution N/A N/A Pain Control: Subcutaneous, Slough N/A N/A Tissue Debrided: Skin/Subcutaneous Tissue N/A N/A Level: 2 N/A N/A Debridement A (sq cm): rea Curette N/A N/A Instrument: Minimum N/A N/A Bleeding: Pressure N/A N/A Hemostasis A chieved: 0 N/A N/A Procedural Pain: 0 N/A N/A Post Procedural Pain: Procedure was tolerated well N/A N/A Debridement Treatment Response: 1.8x0.5x0.1 N/A N/A Post Debridement Measurements L x W x D (cm) 0.071 N/A N/A Post Debridement Volume: (cm) Scarring: Yes N/A N/A Periwound Skin Texture: Excoriation: No Induration: No Callus: No Crepitus: No Rash: No Maceration: No N/A N/A Periwound Skin Moisture: Dry/Scaly: No Ecchymosis: Yes N/A N/A Periwound Skin Color: Hemosiderin Staining: Yes Atrophie Blanche: No Cyanosis: No Erythema: No Mottled: No Pallor: No Rubor: No Compression Therapy N/A N/A Procedures Performed: Debridement Vita, [name] Evans ([id number]) [id number] Page 4 of 8 Treatment Notes Wound #1 (Lower Leg) Wound Laterality: Left, Medial Cleanser Soap and Water Discharge Instruction: May shower and wash wound with dial antibacterial soap and water prior to dressing change. Wound Cleanser Discharge Instruction: Cleanse the wound with wound cleanser prior to applying a clean dressing using gauze sponges, not tissue or cotton balls. Peri-Wound Care Triamcinolone 15 (g) Discharge Instruction: Use triamcinolone 15 (g) as directed Sween Lotion (Moisturizing lotion) Discharge Instruction: Apply moisturizing lotion as directed Topical Primary Dressing Promogran Prisma Matrix, 4.34 (sq in) (silver collagen) Discharge Instruction: Moisten collagen with saline or hydrogel Secondary Dressing Woven Gauze Sponge, Non-Sterile 4x4 in Discharge  Instruction: Apply over primary dressing as directed. Secured With Compression Wrap FourPress (4 layer compression wrap) Discharge Instruction: Apply four layer compression. Compression Stockings Add-Ons Electronic Signature(s) Signed: 12/07/2022 12:02:25 PM By: Kalman Shan DO Entered By: Kalman Shan on 12/07/2022 11:08:03 -------------------------------------------------------------------------------- Multi-Disciplinary Care Plan Details Patient Name: Date of Service: [name] Evans, [name] Evans 12/07/2022 10:15 A M Medical Record Number: [id number] Patient Account Number: [id number] Date of Birth/Sex: Treating RN: 20-Jul-1976 (47 y.o. [name] Primary Care Caira Poche: PA Haig Prophet, [place] Other Clinician: Referring Priyana Mccarey: Treating Arleatha Philipps/Extender: Yaakov Guthrie in Treatment: 10 Active Inactive Wound/Skin Impairment Nursing Diagnoses: Impaired tissue integrity Knowledge deficit related to ulceration/compromised skin integrity Goals: Patient will have a decrease in wound volume by X% from date: (specify in notes) Date Initiated: 09/16/2022 Target Resolution Date: 12/11/2022 Goal Status: Active Patient/caregiver will verbalize understanding of skin care regimen Date Initiated: 09/16/2022 Target Resolution Date: 12/11/2022 Goal Status: Active IDORA, BROSIOUS ([id number]) [id number] Page 5 of 8  Ulcer/skin breakdown will have a volume reduction of 30% by week 4 Date Initiated: 09/16/2022 Date Inactivated: 12/07/2022 Target Resolution Date: 12/12/2022 Goal Status: Met Interventions: Assess patient/caregiver ability to obtain necessary supplies Assess patient/caregiver ability to perform ulcer/skin care regimen upon admission and as needed Assess ulceration(s) every visit Notes: Electronic Signature(s) Signed: 12/08/2022 6:38:55 PM By: Shawn Stall RN, BSN Entered By: Shawn Stall on 12/07/2022  10:37:15 -------------------------------------------------------------------------------- Pain Assessment Details Patient Name: Date of Service: [name] Evans, [name] Evans 12/07/2022 10:15 A M Medical Record Number: [id number] Patient Account Number: [id number] Date of Birth/Sex: Treating RN: 07/01/1976 (47 y.o. F) Primary Care Delton Stelle: PA Zenovia Jordan, [place] Other Clinician: Referring Lynna Zamorano: Treating Alika Saladin/Extender: Tilda Franco in Treatment: 10 Active Problems Location of Pain Severity and Description of Pain Patient Has Paino No Site Locations Pain Management and Medication Current Pain Management: Electronic Signature(s) Signed: 12/07/2022 4:49:41 PM By: Thayer Dallas Entered By: Thayer Dallas on 12/07/2022 10:28:41 -------------------------------------------------------------------------------- Patient/Caregiver Education Details Patient Name: Date of Service: [name] Evans 1/22/2024andnbsp10:15 Margie Ege ([id number]) [id number] Page 6 of 8 Medical Record Number: [id number] Patient Account Number: [id number] Date of Birth/Gender: Treating RN: 05-19-1976 (47 y.o. Arta Silence Primary Care Physician: PA Zenovia Jordan, [place] Other Clinician: Referring Physician: Treating Physician/Extender: Tilda Franco in Treatment: 10 Education Assessment Education Provided To: Patient Education Topics Provided Wound/Skin Impairment: Handouts: Caring for Your Ulcer Methods: Explain/Verbal Responses: Reinforcements needed Electronic Signature(s) Signed: 12/08/2022 6:38:55 PM By: Shawn Stall RN, BSN Entered By: Shawn Stall on 12/07/2022 10:37:33 -------------------------------------------------------------------------------- Wound Assessment Details Patient Name: Date of Service: [name] Evans, [name] Evans 12/07/2022 10:15 A M Medical Record Number: [id number] Patient Account Number: [id number] Date of Birth/Sex: Treating RN: 1975-12-26 (47 y.o.  F) Primary Care Dalani Mette: PA TIENT, NO Other Clinician: Referring Keshav Winegar: Treating Loreli Debruler/Extender: Tilda Franco in Treatment: 10 Wound Status Wound Number: 1 Primary Etiology: Venous Leg Ulcer Wound Location: Left, Medial Lower Leg Wound Status: Open Wounding Event: Laceration Comorbid History: Peripheral Venous Disease Date Acquired: 04/16/2022 Weeks Of Treatment: 10 Clustered Wound: No Photos Wound Measurements Length: (cm) 1.8 Width: (cm) 0.5 Depth: (cm) 0.1 Area: (cm) 0.707 Volume: (cm) 0.071 % Reduction in Area: 96.8% % Reduction in Volume: 98.4% Epithelialization: Medium (34-66%) Tunneling: No Undermining: No Wound Description Classification: Full Thickness Without Exposed Support Structures Wound Margin: Distinct, outline attached Exudate Amount: Medium Mahnken, Marcelline ([id number]) Exudate Type: Serosanguineous Exudate Color: red, brown Foul Odor After Cleansing: No Slough/Fibrino No [id number] Page 7 of 8 Wound Bed Granulation Amount: Large (67-100%) Exposed Structure Granulation Quality: Red Fascia Exposed: No Necrotic Amount: None Present (0%) Fat Layer (Subcutaneous Tissue) Exposed: Yes Tendon Exposed: No Muscle Exposed: No Joint Exposed: No Bone Exposed: No Periwound Skin Texture Texture Color No Abnormalities Noted: No No Abnormalities Noted: No Callus: No Atrophie Blanche: No Crepitus: No Cyanosis: No Excoriation: No Ecchymosis: Yes Induration: No Erythema: No Rash: No Hemosiderin Staining: Yes Scarring: Yes Mottled: No Pallor: No Moisture Rubor: No No Abnormalities Noted: No Dry / Scaly: No Maceration: No Treatment Notes Wound #1 (Lower Leg) Wound Laterality: Left, Medial Cleanser Soap and Water Discharge Instruction: May shower and wash wound with dial antibacterial soap and water prior to dressing change. Wound Cleanser Discharge Instruction: Cleanse the wound with wound cleanser  prior to applying a clean dressing using gauze sponges, not tissue or cotton balls. Peri-Wound Care Triamcinolone 15 (g) Discharge Instruction: Use triamcinolone 15 (g) as directed Sween Lotion (Moisturizing lotion) Discharge Instruction: Apply moisturizing lotion as directed Topical Primary Dressing Promogran  Prisma Matrix, 4.34 (sq in) (silver collagen) Discharge Instruction: Moisten collagen with saline or hydrogel Secondary Dressing Woven Gauze Sponge, Non-Sterile 4x4 in Discharge Instruction: Apply over primary dressing as directed. Secured With Compression Wrap FourPress (4 layer compression wrap) Discharge Instruction: Apply four layer compression. Compression Stockings Add-Ons Electronic Signature(s) Signed: 12/07/2022 4:49:41 PM By: Thayer Dallas Entered By: Thayer Dallas on 12/07/2022 10:58:30 [name] ([id number]) [id number] Page 8 of 8 -------------------------------------------------------------------------------- Vitals Details Patient Name: Date of Service: [name] Evans, [name] Evans 12/07/2022 10:15 A M Medical Record Number: [id number] Patient Account Number: [id number] Date of Birth/Sex: Treating RN: 1976-02-25 (47 y.o. F) Primary Care Ollen Rao: PA TIENT, NO Other Clinician: Referring Revis Whalin: Treating Marvin Maenza/Extender: Tilda Franco in Treatment: 10 Vital Signs Time Taken: 10:28 Temperature (F): 98.3 Height (in): 65 Pulse (bpm): 67 Weight (lbs): 115 Respiratory Rate (breaths/min): 18 Body Mass Index (BMI): 19.1 Blood Pressure (mmHg): 167/82 Reference Range: 80 - 120 mg / dl Electronic Signature(s) Signed: 12/07/2022 4:49:41 PM By: Thayer Dallas Entered By: Thayer Dallas on 12/07/2022 10:28:31

## 2022-12-14 ENCOUNTER — Encounter (HOSPITAL_BASED_OUTPATIENT_CLINIC_OR_DEPARTMENT_OTHER): Payer: BC Managed Care – PPO | Admitting: Internal Medicine

## 2022-12-14 DIAGNOSIS — L97822 Non-pressure chronic ulcer of other part of left lower leg with fat layer exposed: Secondary | ICD-10-CM | POA: Diagnosis not present

## 2022-12-14 DIAGNOSIS — I87312 Chronic venous hypertension (idiopathic) with ulcer of left lower extremity: Secondary | ICD-10-CM | POA: Diagnosis not present

## 2022-12-16 NOTE — Progress Notes (Signed)
[name] Evans, [name] Evans ([id number]) [id number] Page 1 of 8 Visit Report for 12/14/2022 Chief Complaint Document Details Patient Name: Date of Service: [name] Evans, [name] Evans 12/14/2022 9:30 A M Medical Record Number: [id number] Patient Account Number: [id number] Date of Birth/Sex: Treating RN: January 23, 1976 (47 y.o. F) Primary Care Provider: PA Haig Prophet, NO Other Clinician: Referring Provider: Treating Provider/Extender: Yaakov Guthrie in Treatment: 11 Information Obtained from: Patient Chief Complaint 09/28/2022; left lower extremity wound Electronic Signature(s) Signed: 12/14/2022 11:13:35 AM By: Kalman Shan DO Entered By: Kalman Shan on 12/14/2022 10:54:10 -------------------------------------------------------------------------------- Debridement Details Patient Name: Date of Service: [name] Evans, [name] Evans 12/14/2022 9:30 A M Medical Record Number: [id number] Patient Account Number: [id number] Date of Birth/Sex: Treating RN: [birth year] (47 y.o. [name] Evans, [name] Primary Care Provider: PA Evans, NO Other Clinician: Referring Provider: Treating Provider/Extender: Yaakov Guthrie in Treatment: 11 Debridement Performed for Assessment: Wound #1 Left,Medial Lower Leg Performed By: Physician Kalman Shan, DO Debridement Type: Debridement Severity of Tissue Pre Debridement: Fat layer exposed Level of Consciousness (Pre-procedure): Awake and Alert Pre-procedure Verification/Time Out Yes - 09:55 Taken: Start Time: 09:55 Pain Control: Lidocaine T Area Debrided (L x W): otal 0.7 (cm) x 0.5 (cm) = 0.35 (cm) Tissue and other material debrided: Viable, Non-Viable, Skin: Dermis , Skin: Epidermis Level: Skin/Epidermis Debridement Description: Selective/Open Wound Instrument: Curette Bleeding: Minimum Hemostasis Achieved: Pressure End Time: 09:55 Procedural Pain: 0 Post Procedural Pain: 0 Response to Treatment: Procedure was tolerated well Level  of Consciousness (Post- Awake and Alert procedure): Post Debridement Measurements of Total Wound Length: (cm) 0.7 Width: (cm) 0.5 Depth: (cm) 0.1 Volume: (cm) 0.027 Character of Wound/Ulcer Post Debridement: Improved Severity of Tissue Post Debridement: Fat layer exposed Evans, [name] ([id number]) [id number] Page 2 of 8 Post Procedure Diagnosis Same as Pre-procedure Electronic Signature(s) Signed: 12/14/2022 11:13:35 AM By: Kalman Shan DO Signed: 12/16/2022 8:39:24 AM By: Rhae Hammock RN Entered By: Rhae Hammock on 12/14/2022 09:55:46 -------------------------------------------------------------------------------- HPI Details Patient Name: Date of Service: [name] Evans, [name] Evans 12/14/2022 9:30 A M Medical Record Number: [id number] Patient Account Number: [id number] Date of Birth/Sex: Treating RN: 1976-08-07 (47 y.o. F) Primary Care Provider: PA Haig Prophet, NO Other Clinician: Referring Provider: Treating Provider/Extender: Yaakov Guthrie in Treatment: 11 History of Present Illness HPI Description: Admission 09/28/2022 Ms. 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 She states that her mother had a history of venous insufficiency with wounds as well. She has been using bacitracin to the wound bed. She has chronic pain to the area. She was seen in the ED for this issue on 10/21 and 10/24. She completed a 10-day course of doxycycline and Keflex for this issue. She states she works at [employer] and is on her feet for over 12 hours a day. She is not using compression therapy. She currently denies systemic signs of infection. 11/20; patient presents for follow-up. She has been using Medihoney and Hydrofera Blue to the wound bed. She reports improvement in pain  however still has tenderness to the wound sites. She states that she has been elevating her leg and using an Ace bandage. She has been out of work for the past week. She had a PCR culture done at last clinic visit that grew coag negative staph and Candida parapsilosis. Keystone antibiotic ointment has been ordered. 11/27; patient presents for follow-up. She  received the [facility] antibiotic ointment and started this 4 days ago. She states that the wound site is still very tender. She has been using an Ace wrap for compression as she cannot tolerate anything tighter. 12/4; patient presents for follow-up. She has been using Keystone antibiotic with Hydrofera Blue under Ace wrap. She states the chronic pain has improved. She brought in short-term disability papers to be filled out. 12/11; patient presents for follow-up. We have been using Keystone antibiotic ointment and Hydrofera Blue under Kerlix/Coban. This is the first time with a true compression wrap. She tolerated this well. She has no issues or complaints today. She was contacted by vein and vascular today for her venous reflux studies. She states she is going to call them back to schedule these. 12/18; patient presents for follow-up. We have been using Keystone antibiotic and Hydrofera Blue under 3 layer compression. She tolerated the wrap well. She has no issues or complaints today. She states she has scheduled her venous reflux studies. 12/26; patient presents for follow-up. We have been using Keystone antibiotic with Hydrofera Blue under 4-layer compression. She tolerated the increase in compression well. She has no issues or complaints today. 1/2; patient presents for follow-up. We have been using Keystone antibiotic with collagen under 4-layer compression. She has tolerated this well. She would like to return back to work. 1/8; patient presents for follow-up. We have been using Keystone antibiotic ointment with collagen under 4-layer  compression. She has no issues or complaints today. She does not have the [facility] antibiotic ointment with her today. She has returned to work and reports no issues with this. 1/15; patient presents for follow-up left lower leg wound using Keystone and collagen under 4-layer compression. Patient with significant chronic venous insufficiency. She also tells me she has a history of DVT during a pregnancy several years ago at which time she had an IVC filter which has since been removed. 1/22; patient presents for follow-up. We have been using Prisma under 4-layer compression to the left lower extremity. She has no issues or complaints today. There is been improvement in wound healing. 1/29; patient presents for follow-up. We have been using Prisma under 4-layer compression to the left lower extremity. She has tolerated this well. There continues to be improvement in wound healing. She states she is scheduled for her venous reflux studies the second week in February. Electronic Signature(s) Signed: 12/14/2022 11:13:35 AM By: Kalman Shan DO Entered By: Kalman Shan on 12/14/2022 10:54:37 Sheetz, [name] Evans ([id number]) [id number] Page 3 of 8 -------------------------------------------------------------------------------- Physical Exam Details Patient Name: Date of Service: [name] Evans, [name] Evans 12/14/2022 9:30 A M Medical Record Number: [id number] Patient Account Number: [id number] Date of Birth/Sex: Treating RN: Nov 05, 1976 (47 y.o. F) Primary Care Provider: PA Evans, NO Other Clinician: Referring Provider: Treating Provider/Extender: Yaakov Guthrie in Treatment: 11 Constitutional respirations regular, non-labored and within target range for patient.. Cardiovascular 2+ dorsalis pedis/posterior tibialis pulses. Psychiatric pleasant and cooperative. Notes Left lower extremity: Good edema control. Hemosiderin deposition on the medial left lower leg. Open  wound with granulation tissue and nonviable tissue. No signs of infection. Electronic Signature(s) Signed: 12/14/2022 11:13:35 AM By: Kalman Shan DO Entered By: Kalman Shan on 12/14/2022 10:55:35 -------------------------------------------------------------------------------- Physician Orders Details Patient Name: Date of Service: [name] Evans, [name] Evans 12/14/2022 9:30 A M Medical Record Number: [id number] Patient Account Number: [id number] Date of Birth/Sex: Treating RN: [birth year] (47 y.o. [name] Evans, [name] Primary Care Provider: PA Darnelle Spangle Other Clinician: Referring Provider: Treating Provider/Extender: Kalman Shan  Weeks in Treatment: 11 Verbal / Phone Orders: No Diagnosis Coding Follow-up Appointments ppointment in 1 week. - w/ Dr. Heber [place] Monday 12/21/22 @ 0800 Rm # 8 w/ bobbi Return A ppointment in 2 weeks. - Dr. Heber [place] Monday 12/28/22 @ 0930 Rm # 9 w/ lauran Return A Anesthetic (In clinic) Topical Lidocaine 5% applied to wound bed Bathing/ Shower/ Hygiene May shower with protection but do not get wound dressing(s) wet. Protect dressing(s) with water repellant cover (for example, large plastic bag) or a cast cover and may then take shower. Edema Control - Lymphedema / SCD / Other Avoid standing for long periods of time. Wound Treatment Wound #1 - Lower Leg Wound Laterality: Left, Medial Cleanser: Soap and Water 1 x Per Week/30 Days Discharge Instructions: May shower and wash wound with dial antibacterial soap and water prior to dressing change. Cleanser: Wound Cleanser (Generic) 1 x Per Week/30 Days Discharge Instructions: Cleanse the wound with wound cleanser prior to applying a clean dressing using gauze sponges, not tissue or cotton balls. Peri-Wound Care: Triamcinolone 15 (g) 1 x Per Week/30 Days Sacra, [name] Evans ([id number]) [id number] Page 4 of 8 Discharge Instructions: Use triamcinolone 15 (g) as directed Peri-Wound Care:  Sween Lotion (Moisturizing lotion) 1 x Per Week/30 Days Discharge Instructions: Apply moisturizing lotion as directed Prim Dressing: Promogran Prisma Matrix, 4.34 (sq in) (silver collagen) 1 x Per Week/30 Days ary Discharge Instructions: Moisten collagen with saline or hydrogel Secondary Dressing: Woven Gauze Sponge, Non-Sterile 4x4 in (Generic) 1 x Per Week/30 Days Discharge Instructions: Apply over primary dressing as directed. Compression Wrap: FourPress (4 layer compression wrap) 1 x Per Week/30 Days Discharge Instructions: Apply four layer compression. Electronic Signature(s) Signed: 12/14/2022 11:13:35 AM By: Kalman Shan DO Entered By: Kalman Shan on 12/14/2022 10:55:42 -------------------------------------------------------------------------------- Problem List Details Patient Name: Date of Service: [name] Evans, [name] Evans 12/14/2022 9:30 A M Medical Record Number: [id number] Patient Account Number: [id number] Date of Birth/Sex: Treating RN: 22-Sep-1976 (47 y.o. F) Primary Care Provider: PA Haig Prophet, NO Other Clinician: Referring Provider: Treating Provider/Extender: Yaakov Guthrie in Treatment: 11 Active Problems ICD-10 Encounter Code Description Active Date MDM Diagnosis L97.822 Non-pressure chronic ulcer of other part of left lower leg with fat layer 09/28/2022 No Yes exposed I87.312 Chronic venous hypertension (idiopathic) with ulcer of left lower extremity 09/28/2022 No Yes Inactive Problems Resolved Problems Electronic Signature(s) Signed: 12/14/2022 11:13:35 AM By: Kalman Shan DO Entered By: Kalman Shan on 12/14/2022 10:53:58 -------------------------------------------------------------------------------- Progress Note Details Patient Name: Date of Service: [name] Evans, [name] Evans 12/14/2022 9:30 A M Medical Record Number: [id number] Patient Account Number: [id number] Date of Birth/Sex: Treating RN: 28-Jan-1976 (47 y.o. F) Primary Care Provider: PA Darnelle Spangle Other ClinicianPRAIRIE, [name] Evans ([id number]) [id number] Page 5 of 8 Referring Provider: Treating Provider/Extender: Yaakov Guthrie in Treatment: 11 Subjective Chief Complaint Information obtained from Patient 09/28/2022; left lower extremity wound History of Present Illness (HPI) Admission 09/28/2022 Ms. 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 She states that her mother had a history of venous insufficiency with wounds as well. She has been using bacitracin to the wound bed. She has chronic pain to the area. She was seen in the ED for this issue on 10/21 and 10/24. She completed a 10-day course of doxycycline and Keflex for this issue. She states she works  at [employer] and is on her feet for over 12 hours a day. She is not using compression therapy. She currently denies systemic signs of infection. 11/20; patient presents for follow-up. She has been using Medihoney and Hydrofera Blue to the wound bed. She reports improvement in pain however still has tenderness to the wound sites. She states that she has been elevating her leg and using an Ace bandage. She has been out of work for the past week. She had a PCR culture done at last clinic visit that grew coag negative staph and Candida parapsilosis. Keystone antibiotic ointment has been ordered. 11/27; patient presents for follow-up. She received the [facility] antibiotic ointment and started this 4 days ago. She states that the wound site is still very tender. She has been using an Ace wrap for compression as she cannot tolerate anything tighter. 12/4; patient presents for follow-up. She has been using Keystone antibiotic with Hydrofera Blue under Ace wrap. She states the chronic pain has improved. She brought in  short-term disability papers to be filled out. 12/11; patient presents for follow-up. We have been using Keystone antibiotic ointment and Hydrofera Blue under Kerlix/Coban. This is the first time with a true compression wrap. She tolerated this well. She has no issues or complaints today. She was contacted by vein and vascular today for her venous reflux studies. She states she is going to call them back to schedule these. 12/18; patient presents for follow-up. We have been using Keystone antibiotic and Hydrofera Blue under 3 layer compression. She tolerated the wrap well. She has no issues or complaints today. She states she has scheduled her venous reflux studies. 12/26; patient presents for follow-up. We have been using Keystone antibiotic with Hydrofera Blue under 4-layer compression. She tolerated the increase in compression well. She has no issues or complaints today. 1/2; patient presents for follow-up. We have been using Keystone antibiotic with collagen under 4-layer compression. She has tolerated this well. She would like to return back to work. 1/8; patient presents for follow-up. We have been using Keystone antibiotic ointment with collagen under 4-layer compression. She has no issues or complaints today. She does not have the [facility] antibiotic ointment with her today. She has returned to work and reports no issues with this. 1/15; patient presents for follow-up left lower leg wound using Keystone and collagen under 4-layer compression. Patient with significant chronic venous insufficiency. She also tells me she has a history of DVT during a pregnancy several years ago at which time she had an IVC filter which has since been removed. 1/22; patient presents for follow-up. We have been using Prisma under 4-layer compression to the left lower extremity. She has no issues or complaints today. There is been improvement in wound healing. 1/29; patient presents for follow-up. We have been  using Prisma under 4-layer compression to the left lower extremity. She has tolerated this well. There continues to be improvement in wound healing. She states she is scheduled for her venous reflux studies the second week in February. Patient History Information obtained from Patient, Chart. Family History Unknown History, Cancer - Father, Hypertension - Mother, Lung Disease - Father, Thyroid Problems - Mother,Siblings, No family history of Tuberculosis. Social History Current every day smoker - e-ciggs, Alcohol Use - Never, Drug Use - No History, Caffeine Use - Rarely. Medical History Cardiovascular Patient has history of Peripheral Venous Disease Hospitalization/Surgery History - IVC filter. - tubal ligation. Medical A Surgical History Notes nd Hematologic/Lymphatic DVT Musculoskeletal scoliosis Objective Such,  [name] Evans ([id number]) [id number] Page 6 of 8 Constitutional respirations regular, non-labored and within target range for patient.. Vitals Time Taken: 9:37 AM, Height: 65 in, Weight: 115 lbs, BMI: 19.1, Temperature: 98.7 F, Pulse: 74 bpm, Respiratory Rate: 17 breaths/min. Cardiovascular 2+ dorsalis pedis/posterior tibialis pulses. Psychiatric pleasant and cooperative. General Notes: Left lower extremity: Good edema control. Hemosiderin deposition on the medial left lower leg. Open wound with granulation tissue and nonviable tissue. No signs of infection. Integumentary (Hair, Skin) Wound #1 status is Open. Original cause of wound was Laceration. The date acquired was: 04/16/2022. The wound has been in treatment 11 weeks. The wound is located on the Left,Medial Lower Leg. The wound measures 0.7cm length x 0.5cm width x 0.1cm depth; 0.275cm^2 area and 0.027cm^3 volume. There is Fat Layer (Subcutaneous Tissue) exposed. There is no tunneling or undermining noted. There is a medium amount of serosanguineous drainage noted. The wound margin is  distinct with the outline attached to the wound base. There is large (67-100%) red granulation within the wound bed. There is no necrotic tissue within the wound bed. The periwound skin appearance exhibited: Scarring, Ecchymosis, Hemosiderin Staining. The periwound skin appearance did not exhibit: Callus, Crepitus, Excoriation, Induration, Rash, Dry/Scaly, Maceration, Atrophie Blanche, Cyanosis, Mottled, Pallor, Rubor, Erythema. 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 I debrided nonviable tissue. I recommended continuing the course with collagen under 4-layer compression. Per patient she is scheduled the second week in February for her venous reflux studies. Procedures Wound #1 Pre-procedure diagnosis of Wound #1 is a Venous Leg Ulcer located on the Left,Medial Lower Leg .Severity of Tissue Pre Debridement is: Fat layer exposed. There was a Selective/Open Wound Skin/Epidermis Debridement with a total area of 0.35 sq cm performed by Geralyn Corwin, DO. With the following instrument(s): Curette to remove Viable and Non-Viable tissue/material. Material removed includes Skin: Dermis and Skin: Epidermis and after achieving pain control using Lidocaine. No specimens were taken. A time out was conducted at 09:55, prior to the start of the procedure. A Minimum amount of bleeding was controlled with Pressure. The procedure was tolerated well with a pain level of 0 throughout and a pain level of 0 following the procedure. Post Debridement Measurements: 0.7cm length x 0.5cm width x 0.1cm depth; 0.027cm^3 volume. Character of Wound/Ulcer Post Debridement is improved. Severity of Tissue Post Debridement is: Fat layer exposed. Post procedure Diagnosis Wound #1: Same as  Pre-Procedure Pre-procedure diagnosis of Wound #1 is a Venous Leg Ulcer located on the Left,Medial Lower Leg . There was a Four Layer Compression Therapy Procedure by Fonnie Mu, RN. Post procedure Diagnosis Wound #1: Same as Pre-Procedure Plan Follow-up Appointments: Return Appointment in 1 week. - w/ Dr. Mikey Bussing Monday 12/21/22 @ 0800 Rm # 8 w/ bobbi Return Appointment in 2 weeks. - Dr. Mikey Bussing Monday 12/28/22 @ 0930 Rm # 9 w/ lauran Anesthetic: (In clinic) Topical Lidocaine 5% applied to wound bed Bathing/ Shower/ Hygiene: May shower with protection but do not get wound dressing(s) wet. Protect dressing(s) with water repellant cover (for example, large plastic bag) or a cast cover and may then take shower. Edema Control - Lymphedema / SCD / Other: Avoid standing for long periods of time. WOUND #1: - Lower Leg Wound Laterality: Left, Medial Cleanser: Soap and Water 1 x Per Week/30 Days Discharge Instructions: May  shower and wash wound with dial antibacterial soap and water prior to dressing change. Cleanser: Wound Cleanser (Generic) 1 x Per Week/30 Days Discharge Instructions: Cleanse the wound with wound cleanser prior to applying a clean dressing using gauze sponges, not tissue or cotton balls. Peri-Wound Care: Triamcinolone 15 (g) 1 x Per Week/30 Days Discharge Instructions: Use triamcinolone 15 (g) as directed Peri-Wound Care: Sween Lotion (Moisturizing lotion) 1 x Per Week/30 Days Discharge Instructions: Apply moisturizing lotion as directed Prim Dressing: Promogran Prisma Matrix, 4.34 (sq in) (silver collagen) 1 x Per Week/30 Days BRYSSA, TONES ([id number]) [id number] Page 7 of 8 Discharge Instructions: Moisten collagen with saline or hydrogel Secondary Dressing: Woven Gauze Sponge, Non-Sterile 4x4 in (Generic) 1 x Per Week/30 Days Discharge Instructions: Apply over primary dressing as directed. Compression Wrap: FourPress (4 layer  compression wrap) 1 x Per Week/30 Days Discharge Instructions: Apply four layer compression. 1. In office sharp debridement 2. Collagen under 4-layer compressionooleft lower extremity 3. Follow-up in 1 week Electronic Signature(s) Signed: 12/14/2022 11:13:35 AM By: Geralyn Corwin DO Entered By: Geralyn Corwin on 12/14/2022 10:56:27 -------------------------------------------------------------------------------- HxROS Details Patient Name: Date of Service: [name] Evans, [name] Evans 12/14/2022 9:30 A M Medical Record Number: [id number] Patient Account Number: [id number] Date of Birth/Sex: Treating RN: 06-Nov-1976 (47 y.o. F) Primary Care Provider: PA Zenovia Jordan, NO Other Clinician: Referring Provider: Treating Provider/Extender: Tilda Franco in Treatment: 11 Information Obtained From Patient Chart Hematologic/Lymphatic Medical History: Past Medical History Notes: DVT Cardiovascular Medical History: Positive for: Peripheral Venous Disease Musculoskeletal Medical History: Past Medical History Notes: scoliosis Immunizations Pneumococcal Vaccine: Received Pneumococcal Vaccination: No Implantable Devices None Hospitalization / Surgery History Type of Hospitalization/Surgery IVC filter tubal ligation Family and Social History Unknown History: Yes; Cancer: Yes - Father; Hypertension: Yes - Mother; Lung Disease: Yes - Father; Thyroid Problems: Yes - Mother,Siblings; Tuberculosis: No; Current every day smoker - e-ciggs; Alcohol Use: Never; Drug Use: No History; Caffeine Use: Rarely; Financial Concerns: No; Food, Clothing or Shelter Needs: No; Support System Lacking: No; Transportation Concerns: No Electronic Signature(s) Signed: 12/14/2022 11:13:35 AM By: Geralyn Corwin DO Entered By: Geralyn Corwin on 12/14/2022 10:54:42 Azevedo, [name] Evans ([id number]) [id number] Page 8 of  8 -------------------------------------------------------------------------------- SuperBill Details Patient Name: Date of Service: KEISY, STRICKLER 12/14/2022 Medical Record Number: [id number] Patient Account Number: [id number] Date of Birth/Sex: Treating RN: 07-23-1976 (47 y.o. [name], [name] Primary Care Provider: PA Evans, NO Other Clinician: Referring Provider: Treating Provider/Extender: Tilda Franco in Treatment: 11 Diagnosis Coding ICD-10 Codes Code Description [phone] Non-pressure chronic ulcer of other part of left lower leg with fat layer exposed I87.312 Chronic venous hypertension (idiopathic) with ulcer of left lower extremity Facility Procedures : CPT4 Code: [id number] Description: 97597 - DEBRIDE WOUND 1ST 20 SQ CM OR < ICD-10 Diagnosis Description L97.822 Non-pressure chronic ulcer of other part of left lower leg with fat layer expose Modifier: d Quantity: 1 Physician Procedures : CPT4 Code Description Modifier [id number] - WC PHYS DEBR WO ANESTH 20 SQ CM ICD-10 Diagnosis Description L97.822 Non-pressure chronic ulcer of other part of left lower leg with fat layer exposed Quantity: 1 Electronic Signature(s) Signed: 12/14/2022 11:13:35 AM By: Geralyn Corwin DO Entered By: Geralyn Corwin on 12/14/2022 10:56:36

## 2022-12-16 NOTE — Progress Notes (Signed)
[name], [name] ([id number]) [id number] Page 1 of 7 Visit Report for 12/14/2022 Arrival Information Details Patient Name: Date of Service: [name] Evans, [name] Evans 12/14/2022 9:30 A M Medical Record Number: [id number] Patient Account Number: [id number] Date of Birth/Sex: Treating RN: 04-Oct-1976 (47 y.o. Tonita Phoenix, Lauren Primary Care Avel Ogawa: PA TIENT, NO Other Clinician: Referring Shareef Eddinger: Treating Daziyah Cogan/Extender: Yaakov Guthrie in Treatment: 11 Visit Information History Since Last Visit Added or deleted any medications: No Patient Arrived: Ambulatory Any new allergies or adverse reactions: No Arrival Time: 09:37 Had a fall or experienced change in No Accompanied By: self activities of daily living that may affect Transfer Assistance: None risk of falls: Patient Identification Verified: Yes Signs or symptoms of abuse/neglect since last visito No Secondary Verification Process Completed: Yes Hospitalized since last visit: No Patient Requires Transmission-Based Precautions: No Implantable device outside of the clinic excluding No Patient Has Alerts: No cellular tissue based products placed in the center since last visit: Has Dressing in Place as Prescribed: Yes Has Compression in Place as Prescribed: Yes Pain Present Now: No Electronic Signature(s) Signed: 12/16/2022 8:39:24 AM By: Rhae Hammock RN Entered By: Rhae Hammock on 12/14/2022 09:37:49 -------------------------------------------------------------------------------- Compression Therapy Details Patient Name: Date of Service: [name] Evans 12/14/2022 9:30 [place] Record Number: [id number] Patient Account Number: [id number] Date of Birth/Sex: Treating RN: 28-Jul-1976 (47 y.o. Benjaman Lobe Primary Care Alexsys Eskin: PA Haig Prophet, NO Other Clinician: Referring Maziyah Vessel: Treating Adlean Hardeman/Extender: Yaakov Guthrie in Treatment: 11 Compression Therapy Performed for  Wound Assessment: Wound #1 Left,Medial Lower Leg Performed By: Clinician Rhae Hammock, RN Compression Type: Four Layer Post Procedure Diagnosis Same as Pre-procedure Electronic Signature(s) Signed: 12/16/2022 8:39:24 AM By: Rhae Hammock RN Entered By: Rhae Hammock on 12/14/2022 10:09:00 [name] ([id number]) [id number] Page 2 of 7 -------------------------------------------------------------------------------- Lower Extremity Assessment Details Patient Name: Date of Service: TAQUILLA, DOWNUM 12/14/2022 9:30 A M Medical Record Number: [id number] Patient Account Number: [id number] Date of Birth/Sex: Treating RN: [birth year] (47 y.o. Tonita Phoenix, Lauren Primary Care Laken Rog: PA Haig Prophet, NO Other Clinician: Referring Dartanyon Frankowski: Treating Javious Hallisey/Extender: Yaakov Guthrie in Treatment: 11 Edema Assessment Assessed: [Left: Yes] [Right: No] Edema: [Left: N] [Right: o] Calf Left: Right: Point of Measurement: 28 cm From Medial Instep 32 cm Ankle Left: Right: Point of Measurement: 10 cm From Medial Instep 21.5 cm Vascular Assessment Pulses: Dorsalis Pedis Palpable: [Left:Yes] Posterior Tibial Palpable: [Left:Yes] Electronic Signature(s) Signed: 12/16/2022 8:39:24 AM By: Rhae Hammock RN Entered By: Rhae Hammock on 12/14/2022 09:39:57 -------------------------------------------------------------------------------- Multi Wound Chart Details Patient Name: Date of Service: [name] Evans 12/14/2022 9:30 A M Medical Record Number: [id number] Patient Account Number: [id number] Date of Birth/Sex: Treating RN: [birth year] (47 y.o. F) Primary Care Kauri Garson: PA TIENT, NO Other Clinician: Referring Clary Meeker: Treating Myking Sar/Extender: Yaakov Guthrie in Treatment: 11 Vital Signs Height(in): 65 Pulse(bpm): 74 Weight(lbs): 115 Blood Pressure(mmHg): Body Mass Index(BMI): 19.1 Temperature(F): 98.7 Respiratory  Rate(breaths/min): 17 [1:Photos:] [N/A:N/A] Left, Medial Lower Leg N/A N/A Wound Location: Laceration N/A N/A Wounding Event: Venous Leg Ulcer N/A N/A Primary Etiology: [name] Evans ([id number]) [id number] Page 3 of 7 Peripheral Venous Disease N/A N/A Comorbid History: 04/16/2022 N/A N/A Date Acquired: 80 N/A N/A Weeks of Treatment: Open N/A N/A Wound Status: No N/A N/A Wound Recurrence: 0.7x0.5x0.1 N/A N/A Measurements L x W x D (cm) 0.275 N/A N/A A (cm) : rea 0.027 N/A N/A Volume (cm) : 98.70% N/A N/A % Reduction in Area: 99.40% N/A N/A % Reduction in Volume: Full Thickness Without Exposed  N/A N/A Classification: Support Structures Medium N/A N/A Exudate A mount: Serosanguineous N/A N/A Exudate Type: red, brown N/A N/A Exudate Color: Distinct, outline attached N/A N/A Wound Margin: Large (67-100%) N/A N/A Granulation A mount: Red N/A N/A Granulation Quality: None Present (0%) N/A N/A Necrotic A mount: Fat Layer (Subcutaneous Tissue): Yes N/A N/A Exposed Structures: Fascia: No Tendon: No Muscle: No Joint: No Bone: No Medium (34-66%) N/A N/A Epithelialization: Debridement - Selective/Open Wound N/A N/A Debridement: Pre-procedure Verification/Time Out 09:55 N/A N/A Taken: Lidocaine N/A N/A Pain Control: Skin/Epidermis N/A N/A Level: 0.35 N/A N/A Debridement A (sq cm): rea Curette N/A N/A Instrument: Minimum N/A N/A Bleeding: Pressure N/A N/A Hemostasis A chieved: 0 N/A N/A Procedural Pain: 0 N/A N/A Post Procedural Pain: Procedure was tolerated well N/A N/A Debridement Treatment Response: 0.7x0.5x0.1 N/A N/A Post Debridement Measurements L x W x D (cm) 0.027 N/A N/A Post Debridement Volume: (cm) Scarring: Yes N/A N/A Periwound Skin Texture: Excoriation: No Induration: No Callus: No Crepitus: No Rash: No Maceration: No N/A N/A Periwound Skin Moisture: Dry/Scaly: No Ecchymosis: Yes N/A  N/A Periwound Skin Color: Hemosiderin Staining: Yes Atrophie Blanche: No Cyanosis: No Erythema: No Mottled: No Pallor: No Rubor: No Compression Therapy N/A N/A Procedures Performed: Debridement Treatment Notes Electronic Signature(s) Signed: 12/14/2022 11:13:35 AM By: Kalman Shan DO Entered By: Kalman Shan on 12/14/2022 10:54:03 -------------------------------------------------------------------------------- Multi-Disciplinary Care Plan Details Patient Name: Date of Service: DAVEN, PINCKNEY 12/14/2022 9:30 A M Medical Record Number: [id number] Patient Account Number: [id number] Date of Birth/Sex: Treating RN: 1976/08/14 (47 y.o. Benjaman Lobe Primary Care Santa Abdelrahman: PA Haig Prophet, [place] Other Clinician: Referring Brexlee Heberlein: Treating Margel Joens/Extender: Yaakov Guthrie in Treatment: [address], [name] ([id number]) [id number] Page 4 of 7 Active Inactive Wound/Skin Impairment Nursing Diagnoses: Impaired tissue integrity Knowledge deficit related to ulceration/compromised skin integrity Goals: Patient will have a decrease in wound volume by X% from date: (specify in notes) Date Initiated: 09/16/2022 Target Resolution Date: 12/19/2022 Goal Status: Active Patient/caregiver will verbalize understanding of skin care regimen Date Initiated: 09/16/2022 Target Resolution Date: 12/19/2022 Goal Status: Active Ulcer/skin breakdown will have a volume reduction of 30% by week 4 Date Initiated: 09/16/2022 Date Inactivated: 12/07/2022 Target Resolution Date: 12/12/2022 Goal Status: Met Interventions: Assess patient/caregiver ability to obtain necessary supplies Assess patient/caregiver ability to perform ulcer/skin care regimen upon admission and as needed Assess ulceration(s) every visit Notes: Electronic Signature(s) Signed: 12/16/2022 8:39:24 AM By: Rhae Hammock RN Entered By: Rhae Hammock on 12/14/2022  09:59:46 -------------------------------------------------------------------------------- Pain Assessment Details Patient Name: Date of Service: CAM, HARNDEN 12/14/2022 9:30 A M Medical Record Number: [id number] Patient Account Number: [id number] Date of Birth/Sex: Treating RN: September 07, 1976 (47 y.o. Tonita Phoenix, Lauren Primary Care Shuan Statzer: PA Haig Prophet, NO Other Clinician: Referring Marilynne Dupuis: Treating Opel Lejeune/Extender: Yaakov Guthrie in Treatment: 11 Active Problems Location of Pain Severity and Description of Pain Patient Has Paino No Site Locations Pain Management and Medication Current Pain Management: WANDALENE, ABRAMS ([id number]) [id number] Page 5 of 7 Electronic Signature(s) Signed: 12/16/2022 8:39:24 AM By: Rhae Hammock RN Entered By: Rhae Hammock on 12/14/2022 09:39:50 -------------------------------------------------------------------------------- Patient/Caregiver Education Details Patient Name: Date of Service: Norm Salt 1/29/2024andnbsp9:30 [place] Record Number: [id number] Patient Account Number: [id number] Date of Birth/Gender: Treating RN: 1976/07/24 (47 y.o. Benjaman Lobe Primary Care Physician: PA Haig Prophet, NO Other Clinician: Referring Physician: Treating Physician/Extender: Yaakov Guthrie in Treatment: 11 Education Assessment Education Provided To: Patient Education Topics Provided Wound/Skin Impairment: Methods: Explain/Verbal Responses: Reinforcements needed, State content correctly Electronic  Signature(s) Signed: 12/16/2022 8:39:24 AM By: Rhae Hammock RN Entered By: Rhae Hammock on 12/14/2022 09:59:57 -------------------------------------------------------------------------------- Wound Assessment Details Patient Name: Date of Service: KARYME, MCCONATHY 12/14/2022 9:30 A M Medical Record Number: [id number] Patient Account Number: [id number] Date of Birth/Sex: Treating  RN: [birth year] (47 y.o. Tonita Phoenix, Lauren Primary Care Daylah Sayavong: PA Darnelle Spangle Other Clinician: Referring Brevin Mcfadden: Treating Mihira Tozzi/Extender: Yaakov Guthrie in Treatment: 11 Wound Status Wound Number: 1 Primary Etiology: Venous Leg Ulcer Wound Location: Left, Medial Lower Leg Wound Status: Open Wounding Event: Laceration Comorbid History: Peripheral Venous Disease Date Acquired: 04/16/2022 Weeks Of Treatment: 11 Clustered Wound: No Photos [place], [name] ([id number]) [id number] Page 6 of 7 Wound Measurements Length: (cm) 0 Width: (cm) 0 Depth: (cm) 0 Area: (cm) Volume: (cm) .7 % Reduction in Area: 98.7% .5 % Reduction in Volume: 99.4% .1 Epithelialization: Medium (34-66%) 0.275 Tunneling: No 0.027 Undermining: No Wound Description Classification: Full Thickness Without Exposed Suppor Wound Margin: Distinct, outline attached Exudate Amount: Medium Exudate Type: Serosanguineous Exudate Color: red, brown t Structures Foul Odor After Cleansing: No Slough/Fibrino No Wound Bed Granulation Amount: Large (67-100%) Exposed Structure Granulation Quality: Red Fascia Exposed: No Necrotic Amount: None Present (0%) Fat Layer (Subcutaneous Tissue) Exposed: Yes Tendon Exposed: No Muscle Exposed: No Joint Exposed: No Bone Exposed: No Periwound Skin Texture Texture Color No Abnormalities Noted: No No Abnormalities Noted: No Callus: No Atrophie Blanche: No Crepitus: No Cyanosis: No Excoriation: No Ecchymosis: Yes Induration: No Erythema: No Rash: No Hemosiderin Staining: Yes Scarring: Yes Mottled: No Pallor: No Moisture Rubor: No No Abnormalities Noted: No Dry / Scaly: No Maceration: No Electronic Signature(s) Signed: 12/16/2022 8:39:24 AM By: Rhae Hammock RN Entered By: Rhae Hammock on 12/14/2022 09:46:34 -------------------------------------------------------------------------------- Vitals Details Patient Name:  Date of Service: JORDIE, SKALSKY 12/14/2022 9:30 A M Medical Record Number: [id number] Patient Account Number: [id number] Date of Birth/Sex: Treating RN: [birth year] (47 y.o. Benjaman Lobe Primary Care Awanda Wilcock: PA Darnelle Spangle Other Clinician: Referring Watt Geiler: Treating Laine Giovanetti/Extender: Yaakov Guthrie in Treatment: 11 Vital Signs [place], [name] ([id number]) [id number] Page 7 of 7 Time Taken: 09:37 Temperature (F): 98.7 Height (in): 65 Pulse (bpm): 74 Weight (lbs): 115 Respiratory Rate (breaths/min): 17 Body Mass Index (BMI): 19.1 Reference Range: 80 - 120 mg / dl Electronic Signature(s) Signed: 12/16/2022 8:39:24 AM By: Rhae Hammock RN Entered By: Rhae Hammock on 12/14/2022 09:39:45

## 2022-12-21 ENCOUNTER — Encounter (HOSPITAL_BASED_OUTPATIENT_CLINIC_OR_DEPARTMENT_OTHER): Payer: BC Managed Care – PPO | Attending: Internal Medicine | Admitting: Internal Medicine

## 2022-12-21 DIAGNOSIS — L97822 Non-pressure chronic ulcer of other part of left lower leg with fat layer exposed: Secondary | ICD-10-CM | POA: Insufficient documentation

## 2022-12-21 DIAGNOSIS — Z86718 Personal history of other venous thrombosis and embolism: Secondary | ICD-10-CM | POA: Diagnosis not present

## 2022-12-21 DIAGNOSIS — I87312 Chronic venous hypertension (idiopathic) with ulcer of left lower extremity: Secondary | ICD-10-CM | POA: Diagnosis not present

## 2022-12-21 DIAGNOSIS — G8929 Other chronic pain: Secondary | ICD-10-CM | POA: Diagnosis not present

## 2022-12-21 DIAGNOSIS — I890 Lymphedema, not elsewhere classified: Secondary | ICD-10-CM | POA: Insufficient documentation

## 2022-12-21 DIAGNOSIS — I872 Venous insufficiency (chronic) (peripheral): Secondary | ICD-10-CM | POA: Insufficient documentation

## 2022-12-21 NOTE — Progress Notes (Signed)
[name] Evans ([id number]) [id number] Page 1 of 7 Visit Report for 12/21/2022 Chief Complaint Document Details Patient Name: Date of Service: [name] Evans, [name] Evans 12/21/2022 8:00 A M Medical Record Number: [id number] Patient Account Number: [id number] Date of Birth/Sex: Treating RN: 10/10/1976 (47 y.o. F) Primary Care Provider: PA Zenovia Jordan, [place] Other Clinician: Referring Provider: Treating Provider/Extender: Tilda Franco in Treatment: 12 Information Obtained from: Patient Chief Complaint 09/28/2022; left lower extremity wound Electronic Signature(s) Signed: 12/21/2022 9:08:23 AM By: Geralyn Corwin DO Entered By: Geralyn Corwin on 12/21/2022 08:44:51 -------------------------------------------------------------------------------- HPI Details Patient Name: Date of Service: [name] Evans, [name] Evans 12/21/2022 8:00 A M Medical Record Number: [id number] Patient Account Number: [id number] Date of Birth/Sex: Treating RN: August 13, 1976 (47 y.o. F) Primary Care Provider: PA Zenovia Jordan, [place] Other Clinician: Referring Provider: Treating Provider/Extender: Tilda Franco in Treatment: 12 History of Present Illness HPI Description: Admission 09/28/2022 Ms. 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 She states that her mother had a history of venous insufficiency with wounds as well. She has been using bacitracin to the wound bed. She has chronic pain to the area. She was seen in the ED for this issue on 10/21 and 10/24. She completed a 10-day course of doxycycline and Keflex for this issue. She states she works at [employer] and is on her feet for over 12 hours a day. She is not using compression therapy. She currently denies systemic signs of infection. 11/20; patient presents for follow-up.  She has been using Medihoney and Hydrofera Blue to the wound bed. She reports improvement in pain however still has tenderness to the wound sites. She states that she has been elevating her leg and using an Ace bandage. She has been out of work for the past week. She had a PCR culture done at last clinic visit that grew coag negative staph and Candida parapsilosis. Keystone antibiotic ointment has been ordered. 11/27; patient presents for follow-up. She received the [facility] antibiotic ointment and started this 4 days ago. She states that the wound site is still very tender. She has been using an Ace wrap for compression as she cannot tolerate anything tighter. 12/4; patient presents for follow-up. She has been using Keystone antibiotic with Hydrofera Blue under Ace wrap. She states the chronic pain has improved. She brought in short-term disability papers to be filled out. 12/11; patient presents for follow-up. We have been using Keystone antibiotic ointment and Hydrofera Blue under Kerlix/Coban. This is the first time with a true compression wrap. She tolerated this well. She has no issues or complaints today. She was contacted by vein and vascular today for her venous reflux studies. She states she is going to call them back to schedule these. 12/18; patient presents for follow-up. We have been using Keystone antibiotic and Hydrofera Blue under 3 layer compression. She tolerated the wrap well. She has no issues or complaints today. She states she has scheduled her venous reflux studies. 12/26; patient presents for follow-up. We have been using Keystone antibiotic with Hydrofera Blue under 4-layer compression. She tolerated the increase in compression well. She has no issues or complaints today. 1/2; patient presents for follow-up. We have been using Keystone antibiotic with collagen under 4-layer compression. She has tolerated this well. She would like to return back to work. 1/8; patient presents  for follow-up. We have been using Keystone antibiotic ointment with collagen under 4-layer compression. She has no issues or complaints [name] Evans, [name] Evans ([id number]) [id number].pdf Page 2 of 7 today. She does not have the [facility] antibiotic ointment with her today. She has returned to work and reports no issues with this. 1/15; patient presents for follow-up left lower leg wound using Keystone and collagen under 4-layer compression. Patient with significant chronic venous insufficiency. She also tells me she has a history of DVT during a pregnancy several years ago at which time she had an IVC filter which has since been removed. 1/22; patient presents for follow-up. We have been using Prisma under 4-layer compression to the left lower extremity. She has no issues or complaints today. There is been improvement in wound healing. 1/29; patient presents for follow-up. We have been using Prisma under 4-layer compression to the left lower extremity. She has tolerated this well. There continues to be improvement in wound healing. She states she is scheduled for her venous reflux studies the second week in February. 2/20; patient presents for follow-up. We have been using collagen under 4-layer compression to the left lower extremity. The collagen stuck to the wound bed today. Despite this there continues to be improvement in wound healing. She has no issues or complaints today. Electronic Signature(s) Signed: 12/21/2022 9:08:23 AM By: Kalman Shan DO Entered By: Kalman Shan on 12/21/2022 08:46:21 -------------------------------------------------------------------------------- Physical Exam Details Patient Name: Date of Service: [name] Evans 12/21/2022 8:00 A M Medical Record Number: [id number] Patient Account Number: [id number] Date of Birth/Sex: Treating RN: Mar 27, 1976 (47 y.o. F) Primary Care Provider: PA Haig Prophet, NO Other Clinician: Referring Provider: Treating  Provider/Extender: Yaakov Guthrie in Treatment: 12 Constitutional respirations regular, non-labored and within target range for patient.. Cardiovascular 2+ dorsalis pedis/posterior tibialis pulses. Psychiatric pleasant and cooperative. Notes Left lower extremity: Good edema control. Hemosiderin deposition on the medial left lower leg. Open wound with granulation tissue. No signs of infection. Electronic Signature(s) Signed: 12/21/2022 9:08:23 AM By: Kalman Shan DO Entered By: Kalman Shan on 12/21/2022 09:07:13 -------------------------------------------------------------------------------- Physician Orders Details Patient Name: Date of Service: [name] Evans, [name] Evans 12/21/2022 8:00 A M Medical Record Number: [id number] Patient Account Number: [id number] Date of Birth/Sex: Treating RN: 1975/11/23 (47 y.o. [name] Primary Care Provider: PA Haig Prophet, [place] Other Clinician: Referring Provider: Treating Provider/Extender: Yaakov Guthrie in Treatment: 12 Verbal / Phone Orders: No Diagnosis Coding ICD-10 Coding Code Description [phone] Non-pressure chronic ulcer of other part of left lower leg with fat layer exposed Dawes, Shalana ([id number]) [id number] Page 3 of 7 I87.312 Chronic venous hypertension (idiopathic) with ulcer of left lower extremity Follow-up Appointments ppointment in 1 week. - w/ Dr. Heber [place] Monday 12/28/22 @ 0930 Rm # 9 w/ lauran Return A ppointment in 2 weeks. - Dr. Heber [place] Monday 0800 01/04/2023 Room 8 Return A Anesthetic (In clinic) Topical Lidocaine 5% applied to wound bed Bathing/ Shower/ Hygiene May shower with protection but do not get wound dressing(s) wet. Protect dressing(s) with water repellant cover (for example, large plastic bag) or a cast cover and may then take shower. Edema Control - Lymphedema / SCD / Other Avoid standing for long periods of time. Wound Treatment Wound #1 - Lower Leg Wound  Laterality: Left, Medial Cleanser: Soap and Water 1 x Per Week/30 Days Discharge Instructions: May shower and wash wound with dial antibacterial soap and water prior to dressing change. Cleanser: Wound Cleanser (Generic) 1 x Per Week/30 Days Discharge Instructions: Cleanse the wound with  wound cleanser prior to applying a clean dressing using gauze sponges, not tissue or cotton balls. Peri-Wound Care: Triamcinolone 15 (g) 1 x Per Week/30 Days Discharge Instructions: Use triamcinolone 15 (g) as directed Peri-Wound Care: Sween Lotion (Moisturizing lotion) 1 x Per Week/30 Days Discharge Instructions: Apply moisturizing lotion as directed Prim Dressing: Xeroform Occlusive Gauze Dressing, 4x4 in 1 x Per Week/30 Days ary Discharge Instructions: Apply to wound bed as instructed Secondary Dressing: Woven Gauze Sponge, Non-Sterile 4x4 in (Generic) 1 x Per Week/30 Days Discharge Instructions: Apply over primary dressing as directed. Compression Wrap: FourPress (4 layer compression wrap) 1 x Per Week/30 Days Discharge Instructions: Apply four layer compression. Electronic Signature(s) Signed: 12/21/2022 9:08:23 AM By: Geralyn Corwin DO Entered By: Geralyn Corwin on 12/21/2022 09:07:21 -------------------------------------------------------------------------------- Problem List Details Patient Name: Date of Service: [name] Evans, [name] Evans 12/21/2022 8:00 A M Medical Record Number: [id number] Patient Account Number: [id number] Date of Birth/Sex: Treating RN: 09/06/1976 (47 y.o. Arta Silence Primary Care Provider: PA Zenovia Jordan, [place] Other Clinician: Referring Provider: Treating Provider/Extender: Tilda Franco in Treatment: 12 Active Problems ICD-10 Encounter Code Description Active Date MDM Diagnosis L97.822 Non-pressure chronic ulcer of other part of left lower leg with fat layer 09/28/2022 No Yes exposed I87.312 Chronic venous hypertension (idiopathic) with ulcer of left lower extremity  09/28/2022 No Yes [name] Evans, [name] Evans ([id number]) [id number] Page 4 of 7 Inactive Problems Resolved Problems Electronic Signature(s) Signed: 12/21/2022 9:08:23 AM By: Geralyn Corwin DO Entered By: Geralyn Corwin on 12/21/2022 08:44:38 -------------------------------------------------------------------------------- Progress Note Details Patient Name: Date of Service: [name] Evans, [name] Evans 12/21/2022 8:00 A M Medical Record Number: [id number] Patient Account Number: [id number] Date of Birth/Sex: Treating RN: 21-Feb-1976 (47 y.o. F) Primary Care Provider: PA Zenovia Jordan, [place] Other Clinician: Referring Provider: Treating Provider/Extender: Tilda Franco in Treatment: 12 Subjective Chief Complaint Information obtained from Patient 09/28/2022; left lower extremity wound History of Present Illness (HPI) Admission 09/28/2022 Ms. 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 She states that her mother had a history of venous insufficiency with wounds as well. She has been using bacitracin to the wound bed. She has chronic pain to the area. She was seen in the ED for this issue on 10/21 and 10/24. She completed a 10-day course of doxycycline and Keflex for this issue. She states she works at [employer] and is on her feet for over 12 hours a day. She is not using compression therapy. She currently denies systemic signs of infection. 11/20; patient presents for follow-up. She has been using Medihoney and Hydrofera Blue to the wound bed. She reports improvement in pain however still has tenderness to the wound sites. She states that she has been elevating her leg and using an Ace bandage. She has been out of work for the past week. She had a PCR culture done at last clinic visit that grew coag  negative staph and Candida parapsilosis. Keystone antibiotic ointment has been ordered. 11/27; patient presents for follow-up. She received the [facility] antibiotic ointment and started this 4 days ago. She states that the wound site is still very tender. She has been using an Ace wrap for compression as she cannot tolerate anything tighter. 12/4; patient presents for follow-up. She has been using Keystone antibiotic with Hydrofera Blue under Ace wrap. She states the chronic pain has  improved. She brought in short-term disability papers to be filled out. 12/11; patient presents for follow-up. We have been using Keystone antibiotic ointment and Hydrofera Blue under Kerlix/Coban. This is the first time with a true compression wrap. She tolerated this well. She has no issues or complaints today. She was contacted by vein and vascular today for her venous reflux studies. She states she is going to call them back to schedule these. 12/18; patient presents for follow-up. We have been using Keystone antibiotic and Hydrofera Blue under 3 layer compression. She tolerated the wrap well. She has no issues or complaints today. She states she has scheduled her venous reflux studies. 12/26; patient presents for follow-up. We have been using Keystone antibiotic with Hydrofera Blue under 4-layer compression. She tolerated the increase in compression well. She has no issues or complaints today. 1/2; patient presents for follow-up. We have been using Keystone antibiotic with collagen under 4-layer compression. She has tolerated this well. She would like to return back to work. 1/8; patient presents for follow-up. We have been using Keystone antibiotic ointment with collagen under 4-layer compression. She has no issues or complaints today. She does not have the [facility] antibiotic ointment with her today. She has returned to work and reports no issues with this. 1/15; patient presents for follow-up left lower leg wound  using Keystone and collagen under 4-layer compression. Patient with significant chronic venous insufficiency. She also tells me she has a history of DVT during a pregnancy several years ago at which time she had an IVC filter which has since been removed. 1/22; patient presents for follow-up. We have been using Prisma under 4-layer compression to the left lower extremity. She has no issues or complaints today. There is been improvement in wound healing. 1/29; patient presents for follow-up. We have been using Prisma under 4-layer compression to the left lower extremity. She has tolerated this well. There continues to be improvement in wound healing. She states she is scheduled for her venous reflux studies the second week in February. 2/20; patient presents for follow-up. We have been using collagen under 4-layer compression to the left lower extremity. The collagen stuck to the wound bed today. Despite this there continues to be improvement in wound healing. She has no issues or complaints today. Patient History Information obtained from Patient, Chart. NALAYAH, HITT ([id number]) [id number] Page 5 of 7 Family History Unknown History, Cancer - Father, Hypertension - Mother, Lung Disease - Father, Thyroid Problems - Mother,Siblings, No family history of Tuberculosis. Social History Current every day smoker - e-ciggs, Alcohol Use - Never, Drug Use - No History, Caffeine Use - Rarely. Medical History Cardiovascular Patient has history of Peripheral Venous Disease Hospitalization/Surgery History - IVC filter. - tubal ligation. Medical A Surgical History Notes nd Hematologic/Lymphatic DVT Musculoskeletal scoliosis Objective Constitutional respirations regular, non-labored and within target range for patient.. Vitals Time Taken: 8:20 AM, Height: 65 in, Weight: 115 lbs, BMI: 19.1, Temperature: 98.8 F, Pulse: 76 bpm, Respiratory Rate: 16 breaths/min, Blood  Pressure: 178/91 mmHg. Cardiovascular 2+ dorsalis pedis/posterior tibialis pulses. Psychiatric pleasant and cooperative. General Notes: Left lower extremity: Good edema control. Hemosiderin deposition on the medial left lower leg. Open wound with granulation tissue. No signs of infection. Integumentary (Hair, Skin) Wound #1 status is Open. Original cause of wound was Laceration. The date acquired was: 04/16/2022. The wound has been in treatment 12 weeks. The wound is located on the Left,Medial Lower Leg. The wound measures 0.1cm length x 0.1cm width x 0.1cm depth;  0.008cm^2 area and 0.001cm^3 volume. There is Fat Layer (Subcutaneous Tissue) exposed. There is no tunneling or undermining noted. There is a medium amount of serosanguineous drainage noted. The wound margin is distinct with the outline attached to the wound base. There is large (67-100%) red granulation within the wound bed. There is no necrotic tissue within the wound bed. The periwound skin appearance exhibited: Scarring, Ecchymosis, Hemosiderin Staining. The periwound skin appearance did not exhibit: Callus, Crepitus, Excoriation, Induration, Rash, Dry/Scaly, Maceration, Atrophie Blanche, Cyanosis, Mottled, Pallor, Rubor, Erythema. 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 Since collagen stuck I recommended switching to Xeroform and continuing 4-layer compression. Follow-up in 1 week. Procedures Wound #1 Pre-procedure diagnosis of Wound #1 is a Venous Leg Ulcer located on the Left,Medial Lower Leg . There was a Four Layer Compression Therapy Procedure by Deon Pilling, RN. Post procedure Diagnosis Wound #1: Same as Pre-Procedure [name] Evans, [name] Evans ([id number])  [phone].pdf Page 6 of 7 Plan Follow-up Appointments: Return Appointment in 1 week. - w/ Dr. Heber [place] Monday 12/28/22 @ 0930 Rm # 9 w/ lauran Return Appointment in 2 weeks. - Dr. Heber [place] Monday 0800 01/04/2023 Room 8 Anesthetic: (In clinic) Topical Lidocaine 5% applied to wound bed Bathing/ Shower/ Hygiene: May shower with protection but do not get wound dressing(s) wet. Protect dressing(s) with water repellant cover (for example, large plastic bag) or a cast cover and may then take shower. Edema Control - Lymphedema / SCD / Other: Avoid standing for long periods of time. WOUND #1: - Lower Leg Wound Laterality: Left, Medial Cleanser: Soap and Water 1 x Per Week/30 Days Discharge Instructions: May shower and wash wound with dial antibacterial soap and water prior to dressing change. Cleanser: Wound Cleanser (Generic) 1 x Per Week/30 Days Discharge Instructions: Cleanse the wound with wound cleanser prior to applying a clean dressing using gauze sponges, not tissue or cotton balls. Peri-Wound Care: Triamcinolone 15 (g) 1 x Per Week/30 Days Discharge Instructions: Use triamcinolone 15 (g) as directed Peri-Wound Care: Sween Lotion (Moisturizing lotion) 1 x Per Week/30 Days Discharge Instructions: Apply moisturizing lotion as directed Prim Dressing: Xeroform Occlusive Gauze Dressing, 4x4 in 1 x Per Week/30 Days ary Discharge Instructions: Apply to wound bed as instructed Secondary Dressing: Woven Gauze Sponge, Non-Sterile 4x4 in (Generic) 1 x Per Week/30 Days Discharge Instructions: Apply over primary dressing as directed. Com pression Wrap: FourPress (4 layer compression wrap) 1 x Per Week/30 Days Discharge Instructions: Apply four layer compression. 1. Xeroform under 4-layer compressionooleft lower extremity 2. Follow-up in 1 week Electronic Signature(s) Signed: 12/21/2022 9:08:23 AM By: Kalman Shan DO Entered By: Kalman Shan on 12/21/2022  09:07:52 -------------------------------------------------------------------------------- HxROS Details Patient Name: Date of Service: [name] Evans, [name] Evans 12/21/2022 8:00 A M Medical Record Number: [id number] Patient Account Number: [id number] Date of Birth/Sex: Treating RN: October 04, 1976 (47 y.o. F) Primary Care Provider: PA Haig Prophet, [place] Other Clinician: Referring Provider: Treating Provider/Extender: Yaakov Guthrie in Treatment: 12 Information Obtained From Patient Chart Hematologic/Lymphatic Medical History: Past Medical History Notes: DVT Cardiovascular Medical History: Positive for: Peripheral Venous Disease Musculoskeletal Medical History: Past Medical History Notes: scoliosis Immunizations Pneumococcal Vaccine: Received Pneumococcal Vaccination: No Widmann, [name] Evans ([id number]) [id number] Page 7 of 7 Implantable Devices None Hospitalization / Surgery History Type of Hospitalization/Surgery IVC filter tubal ligation Family and Social History Unknown History: Yes;  Cancer: Yes - Father; Hypertension: Yes - Mother; Lung Disease: Yes - Father; Thyroid Problems: Yes - Mother,Siblings; Tuberculosis: No; Current every day smoker - e-ciggs; Alcohol Use: Never; Drug Use: No History; Caffeine Use: Rarely; Financial Concerns: No; Food, Clothing or Shelter Needs: No; Support System Lacking: No; Transportation Concerns: No Electronic Signature(s) Signed: 12/21/2022 9:08:23 AM By: Kalman Shan DO Entered By: Kalman Shan on 12/21/2022 09:06:39 -------------------------------------------------------------------------------- SuperBill Details Patient Name: Date of Service: [name] Evans, [name] Evans 12/21/2022 Medical Record Number: [id number] Patient Account Number: [id number] Date of Birth/Sex: Treating RN: October 14, 1976 (47 y.o. [name] Primary Care Provider: PA Haig Prophet, [place] Other Clinician: Referring Provider: Treating Provider/Extender: Yaakov Guthrie in Treatment: 12 Diagnosis Coding ICD-10 Codes Code Description [phone] Non-pressure chronic ulcer of other part of left lower leg with fat layer exposed I87.312 Chronic venous hypertension (idiopathic) with ulcer of left lower extremity Facility Procedures : CPT4 Code: [id number] Description: (Facility Use Only) [id number] - [place] LWR LT LEG Modifier: Quantity: 1 Physician Procedures : CPT4 Code Description Modifier [id number] - WC PHYS LEVEL 3 - EST PT ICD-10 Diagnosis Description L97.822 Non-pressure chronic ulcer of other part of left lower leg with fat layer exposed I87.312 Chronic venous hypertension (idiopathic) with ulcer  of left lower extremity Quantity: 1 Electronic Signature(s) Signed: 12/21/2022 9:08:23 AM By: Kalman Shan DO Entered By: Kalman Shan on 12/21/2022 09:08:00

## 2022-12-23 NOTE — Progress Notes (Signed)
[name] Evans, [name] Evans ([id number]) [id number] Page 1 of 7 Visit Report for 12/21/2022 Arrival Information Details Patient Name: Date of Service: [name] Evans, [name] Evans 12/21/2022 8:00 A M Medical Record Number: [id number] Patient Account Number: [id number] Date of Birth/Sex: Treating RN: 03/01/1976 (47 y.o. Helene Shoe, Meta.Reding Primary Care Neidra Girvan: PA TIENT, NO Other Clinician: Referring Valaria Kohut: Treating Natane Heward/Extender: Yaakov Guthrie in Treatment: 12 Visit Information History Since Last Visit Added or deleted any medications: No Patient Arrived: Ambulatory Any new allergies or adverse reactions: No Arrival Time: 08:21 Had a fall or experienced change in No Accompanied By: self activities of daily living that may affect Transfer Assistance: None risk of falls: Patient Identification Verified: Yes Signs or symptoms of abuse/neglect since last visito No Secondary Verification Process Completed: Yes Hospitalized since last visit: No Patient Requires Transmission-Based Precautions: No Implantable device outside of the clinic excluding No Patient Has Alerts: No cellular tissue based products placed in the center since last visit: Has Dressing in Place as Prescribed: Yes Has Compression in Place as Prescribed: Yes Pain Present Now: No Electronic Signature(s) Signed: 12/22/2022 6:17:04 PM By: Deon Pilling RN, BSN Entered By: Deon Pilling on 12/21/2022 08:21:49 -------------------------------------------------------------------------------- Compression Therapy Details Patient Name: Date of Service: [name] Evans 12/21/2022 8:00 [place] Record Number: [id number] Patient Account Number: [id number] Date of Birth/Sex: Treating RN: [birth year] (47 y.o. [name] Primary Care Stone Spirito: PA Haig Prophet, [place] Other Clinician: Referring Deaysia Grigoryan: Treating Xoey Warmoth/Extender: Yaakov Guthrie in Treatment: 12 Compression Therapy Performed for Wound  Assessment: Wound #1 Left,Medial Lower Leg Performed By: Clinician Deon Pilling, RN Compression Type: Four Layer Post Procedure Diagnosis Same as Pre-procedure Electronic Signature(s) Signed: 12/22/2022 6:17:04 PM By: Deon Pilling RN, BSN Entered By: Deon Pilling on 12/21/2022 08:37:52 Mavity, [name] Bers ([id number]) [id number] Page 2 of 7 -------------------------------------------------------------------------------- Encounter Discharge Information Details Patient Name: Date of Service: [name] Evans, [name] Evans 12/21/2022 8:00 A M Medical Record Number: [id number] Patient Account Number: [id number] Date of Birth/Sex: Treating RN: Oct 02, 1976 (47 y.o. [name] Primary Care Dontez Hauss: PA Haig Prophet, NO Other Clinician: Referring Kiwan Gadsden: Treating Dashonda Bonneau/Extender: Yaakov Guthrie in Treatment: 12 Encounter Discharge Information Items Discharge Condition: Stable Ambulatory Status: Ambulatory Discharge Destination: Home Transportation: Private Auto Accompanied By: self Schedule Follow-up Appointment: Yes Clinical Summary of Care: Electronic Signature(s) Signed: 12/22/2022 6:17:04 PM By: Deon Pilling RN, BSN Entered By: Deon Pilling on 12/21/2022 08:46:12 -------------------------------------------------------------------------------- Lower Extremity Assessment Details Patient Name: Date of Service: [name] Evans, [name] Evans 12/21/2022 8:00 [place] Record Number: [id number] Patient Account Number: [id number] Date of Birth/Sex: Treating RN: [birth year] (47 y.o. [name] Primary Care Santez Woodcox: PA Haig Prophet, [place] Other Clinician: Referring Latrisha Coiro: Treating Tarin Johndrow/Extender: Yaakov Guthrie in Treatment: 12 Edema Assessment Assessed: [Left: Yes] [Right: No] Edema: [Left: N] [Right: o] Calf Left: Right: Point of Measurement: 28 cm From Medial Instep 31 cm Ankle Left: Right: Point of Measurement: 10 cm From Medial Instep 21 cm Vascular  Assessment Pulses: Dorsalis Pedis Palpable: [Left:Yes] Electronic Signature(s) Signed: 12/22/2022 6:17:04 PM By: Deon Pilling RN, BSN Entered By: Deon Pilling on 12/21/2022 08:25:31 Multi Wound Chart Details -------------------------------------------------------------------------------- [name] Evans ([id number]) [id number] Page 3 of 7 Patient Name: Date of Service: [name] Evans, [name] Evans 12/21/2022 8:00 A M Medical Record Number: [id number] Patient Account Number: [id number] Date of Birth/Sex: Treating RN: October 28, 1976 (47 y.o. F) Primary Care Brenn Deziel: PA TIENT, NO Other Clinician: Referring Jasyn Mey: Treating Luay Balding/Extender: Yaakov Guthrie in Treatment: 12 Vital Signs Height(in): 65 Pulse(bpm): 76 Weight(lbs): 115 Blood Pressure(mmHg): 178/91  Body Mass Index(BMI): 19.1 Temperature(F): 98.8 Respiratory Rate(breaths/min): 16 Wound Assessments Wound Number: 1 N/A N/A Photos: N/A N/A Left, Medial Lower Leg N/A N/A Wound Location: Laceration N/A N/A Wounding Event: Venous Leg Ulcer N/A N/A Primary Etiology: Peripheral Venous Disease N/A N/A Comorbid History: 04/16/2022 N/A N/A Date Acquired: 12 N/A N/A Weeks of Treatment: Open N/A N/A Wound Status: No N/A N/A Wound Recurrence: 0.1x0.1x0.1 N/A N/A Measurements L x W x D (cm) 0.008 N/A N/A A (cm) : rea 0.001 N/A N/A Volume (cm) : 100.00% N/A N/A % Reduction in A rea: 100.00% N/A N/A % Reduction in Volume: Full Thickness Without Exposed N/A N/A Classification: Support Structures Medium N/A N/A Exudate Amount: Serosanguineous N/A N/A Exudate Type: red, brown N/A N/A Exudate Color: Distinct, outline attached N/A N/A Wound Margin: Large (67-100%) N/A N/A Granulation Amount: Red N/A N/A Granulation Quality: None Present (0%) N/A N/A Necrotic Amount: Fat Layer (Subcutaneous Tissue): Yes N/A N/A Exposed Structures: Fascia: No Tendon: No Muscle: No Joint: No Bone:  No Large (67-100%) N/A N/A Epithelialization: Scarring: Yes N/A N/A Periwound Skin Texture: Excoriation: No Induration: No Callus: No Crepitus: No Rash: No Maceration: No N/A N/A Periwound Skin Moisture: Dry/Scaly: No Ecchymosis: Yes N/A N/A Periwound Skin Color: Hemosiderin Staining: Yes Atrophie Blanche: No Cyanosis: No Erythema: No Mottled: No Pallor: No Rubor: No Compression Therapy N/A N/A Procedures Performed: Treatment Notes Electronic Signature(s) Signed: 12/21/2022 9:08:23 AM By: Kalman Shan DO Entered By: Kalman Shan on 12/21/2022 08:44:42 Camplin, [name] Bers ([id number]) [id number] Page 4 of 7 -------------------------------------------------------------------------------- Multi-Disciplinary Care Plan Details Patient Name: Date of Service: [name] Evans, [name] Evans 12/21/2022 8:00 A M Medical Record Number: [id number] Patient Account Number: [id number] Date of Birth/Sex: Treating RN: 1976-11-06 (47 y.o. [name] Primary Care Seydou Hearns: PA Haig Prophet, NO Other Clinician: Referring Taygen Acklin: Treating Azaliyah Kennard/Extender: Yaakov Guthrie in Treatment: 12 Active Inactive Wound/Skin Impairment Nursing Diagnoses: Impaired tissue integrity Knowledge deficit related to ulceration/compromised skin integrity Goals: Patient will have a decrease in wound volume by X% from date: (specify in notes) Date Initiated: 09/16/2022 Target Resolution Date: 01/08/2023 Goal Status: Active Patient/caregiver will verbalize understanding of skin care regimen Date Initiated: 09/16/2022 Target Resolution Date: 01/08/2023 Goal Status: Active Ulcer/skin breakdown will have a volume reduction of 30% by week 4 Date Initiated: 09/16/2022 Date Inactivated: 12/07/2022 Target Resolution Date: 12/12/2022 Goal Status: Met Interventions: Assess patient/caregiver ability to obtain necessary supplies Assess patient/caregiver ability to perform ulcer/skin care  regimen upon admission and as needed Assess ulceration(s) every visit Notes: Electronic Signature(s) Signed: 12/22/2022 6:17:04 PM By: Deon Pilling RN, BSN Entered By: Deon Pilling on 12/21/2022 08:28:29 -------------------------------------------------------------------------------- Pain Assessment Details Patient Name: Date of Service: [name] Evans, [name] Evans 12/21/2022 8:00 [place] Record Number: [id number] Patient Account Number: [id number] Date of Birth/Sex: Treating RN: Jan 19, 1976 (47 y.o. [name] Primary Care Mikalia Fessel: PA Haig Prophet, NO Other Clinician: Referring Ryenne Lynam: Treating Kei Mcelhiney/Extender: Yaakov Guthrie in Treatment: 12 Active Problems Location of Pain Severity and Description of Pain Patient Has Paino No Site Locations Rate the pain. ARIYEL, JEANGILLES ([id number]) [id number] Page 5 of 7 Rate the pain. Current Pain Level: 0 Pain Management and Medication Current Pain Management: Medication: No Cold Application: No Rest: No Massage: No Activity: No T.E.N.S.: No Heat Application: No Leg drop or elevation: No Is the Current Pain Management Adequate: Adequate How does your wound impact your activities of daily livingo Sleep: No Bathing: No Appetite: No Relationship With Others: No Bladder Continence: No Emotions: No Bowel Continence: No Work: No  Toileting: No Drive: No Dressing: No Hobbies: No Electronic Signature(s) Signed: 12/22/2022 6:17:04 PM By: Deon Pilling RN, BSN Entered By: Deon Pilling on 12/21/2022 08:22:10 -------------------------------------------------------------------------------- Patient/Caregiver Education Details Patient Name: Date of Service: [name] Evans 2/5/2024andnbsp8:00 [place] Record Number: [id number] Patient Account Number: [id number] Date of Birth/Gender: Treating RN: 04-24-1976 (47 y.o. [name] Primary Care Physician: PA Haig Prophet, [place] Other Clinician: Referring  Physician: Treating Physician/Extender: Yaakov Guthrie in Treatment: 12 Education Assessment Education Provided To: Patient Education Topics Provided Wound/Skin Impairment: Handouts: Caring for Your Ulcer Methods: Explain/Verbal Responses: Reinforcements needed Electronic Signature(s) Signed: 12/22/2022 6:17:04 PM By: Deon Pilling RN, BSN Entered By: Deon Pilling on 12/21/2022 08:28:40 Daigler, [name] Bers ([id number]) [id number] Page 6 of 7 -------------------------------------------------------------------------------- Wound Assessment Details Patient Name: Date of Service: [name] Evans, [name] Evans 12/21/2022 8:00 A M Medical Record Number: [id number] Patient Account Number: [id number] Date of Birth/Sex: Treating RN: 04-15-1976 (47 y.o. Helene Shoe, Meta.Reding Primary Care Shiheem Corporan: PA TIENT, NO Other Clinician: Referring Kennet Mccort: Treating Terrionna Bridwell/Extender: Yaakov Guthrie in Treatment: 12 Wound Status Wound Number: 1 Primary Etiology: Venous Leg Ulcer Wound Location: Left, Medial Lower Leg Wound Status: Open Wounding Event: Laceration Comorbid History: Peripheral Venous Disease Date Acquired: 04/16/2022 Weeks Of Treatment: 12 Clustered Wound: No Photos Wound Measurements Length: (cm) 0.1 Width: (cm) 0.1 Depth: (cm) 0.1 Area: (cm) 0.008 Volume: (cm) 0.001 % Reduction in Area: 100% % Reduction in Volume: 100% Epithelialization: Large (67-100%) Tunneling: No Undermining: No Wound Description Classification: Full Thickness Without Exposed Suppor Wound Margin: Distinct, outline attached Exudate Amount: Medium Exudate Type: Serosanguineous Exudate Color: red, brown t Structures Foul Odor After Cleansing: No Slough/Fibrino No Wound Bed Granulation Amount: Large (67-100%) Exposed Structure Granulation Quality: Red Fascia Exposed: No Necrotic Amount: None Present (0%) Fat Layer (Subcutaneous Tissue) Exposed: Yes Tendon Exposed: No Muscle  Exposed: No Joint Exposed: No Bone Exposed: No Periwound Skin Texture Texture Color No Abnormalities Noted: No No Abnormalities Noted: No Callus: No Atrophie Blanche: No Crepitus: No Cyanosis: No Excoriation: No Ecchymosis: Yes Induration: No Erythema: No Rash: No Hemosiderin Staining: Yes Scarring: Yes Mottled: No Pallor: No Moisture Rubor: No No Abnormalities Noted: No Dry / Scaly: No Maceration: No Pickert, Brendia ([id number]) [id number] Page 7 of 7 Treatment Notes Wound #1 (Lower Leg) Wound Laterality: Left, Medial Cleanser Soap and Water Discharge Instruction: May shower and wash wound with dial antibacterial soap and water prior to dressing change. Wound Cleanser Discharge Instruction: Cleanse the wound with wound cleanser prior to applying a clean dressing using gauze sponges, not tissue or cotton balls. Peri-Wound Care Triamcinolone 15 (g) Discharge Instruction: Use triamcinolone 15 (g) as directed Sween Lotion (Moisturizing lotion) Discharge Instruction: Apply moisturizing lotion as directed Topical Primary Dressing Xeroform Occlusive Gauze Dressing, 4x4 in Discharge Instruction: Apply to wound bed as instructed Secondary Dressing Woven Gauze Sponge, Non-Sterile 4x4 in Discharge Instruction: Apply over primary dressing as directed. Secured With Compression Wrap FourPress (4 layer compression wrap) Discharge Instruction: Apply four layer compression. Compression Stockings Add-Ons Electronic Signature(s) Signed: 12/22/2022 6:17:04 PM By: Deon Pilling RN, BSN Entered By: Deon Pilling on 12/21/2022 08:30:24 -------------------------------------------------------------------------------- Vitals Details Patient Name: Date of Service: [name] Evans, [name] Evans 12/21/2022 8:00 [place] Record Number: [id number] Patient Account Number: [id number] Date of Birth/Sex: Treating RN: 01-31-1976 (47 y.o. [name] Primary Care Esvin Hnat:  PA Haig Prophet, NO Other Clinician: Referring Channa Hazelett: Treating Najai Waszak/Extender: Yaakov Guthrie in Treatment: 12 Vital Signs Time Taken: 08:20 Temperature (F): 98.8 Height (in): 65 Pulse (bpm):  76 Weight (lbs): 115 Respiratory Rate (breaths/min): 16 Body Mass Index (BMI): 19.1 Blood Pressure (mmHg): 178/91 Reference Range: 80 - 120 mg / dl Electronic Signature(s) Signed: 12/22/2022 6:17:04 PM By: Shawn Stall RN, BSN Entered By: Shawn Stall on 12/21/2022 08:22:03

## 2022-12-28 ENCOUNTER — Encounter (HOSPITAL_BASED_OUTPATIENT_CLINIC_OR_DEPARTMENT_OTHER): Payer: BC Managed Care – PPO | Admitting: Internal Medicine

## 2022-12-28 DIAGNOSIS — I87312 Chronic venous hypertension (idiopathic) with ulcer of left lower extremity: Secondary | ICD-10-CM

## 2022-12-28 DIAGNOSIS — L97822 Non-pressure chronic ulcer of other part of left lower leg with fat layer exposed: Secondary | ICD-10-CM | POA: Diagnosis not present

## 2022-12-28 NOTE — Progress Notes (Signed)
PORTLAND, GLANZMAN ([id number] Page 1 of 7 Visit Report for 12/28/2022 Chief Complaint Document Details Patient Name: Date of Service: [name] Evans, [name] Evans 12/28/2022 9:30 A M Medical Record Number: [id number] Patient Account Number: [id number] Date of Birth/Sex: Treating RN: [birth year] (47 y.o. F) Primary Care Provider: PA Haig Prophet, NO Other Clinician: Referring Provider: Treating Provider/Extender: Yaakov Guthrie in Treatment: 13 Information Obtained from: Patient Chief Complaint 09/28/2022; left lower extremity wound Electronic Signature(s) Signed: 12/28/2022 1:15:42 PM By: Kalman Shan DO Entered By: Kalman Shan on 12/28/2022 09:58:58 -------------------------------------------------------------------------------- HPI Details Patient Name: Date of Service: [name] Evans, [name] Evans 12/28/2022 9:30 [place] Record Number: [id number] Patient Account Number: [id number] Date of Birth/Sex: Treating RN: 05-06-1976 (47 y.o. 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 She states that her mother had a history of venous insufficiency with wounds as well. She has been using bacitracin to the wound bed. She has chronic pain to the area. She was seen in the ED for this issue on 10/21 and 10/24. She completed a 10-day course of doxycycline and Keflex for this issue. She states she works at [employer] and is on her feet for over 12 hours a day. She is not using compression therapy. She currently denies systemic signs of infection. 11/20; patient presents for  follow-up. She has been using Medihoney and Hydrofera Blue to the wound bed. She reports improvement in pain however still has tenderness to the wound sites. She states that she has been elevating her leg and using an Ace bandage. She has been out of work for the past week. She had a PCR culture done at last clinic visit that grew coag negative staph and Candida parapsilosis. Keystone antibiotic ointment has been ordered. 11/27; patient presents for follow-up. She received the [facility] antibiotic ointment and started this 4 days ago. She states that the wound site is still very tender. She has been using an Ace wrap for compression as she cannot tolerate anything tighter. 12/4; patient presents for follow-up. She has been using Keystone antibiotic with Hydrofera Blue under Ace wrap. She states the chronic pain has improved. She brought in short-term disability papers to be filled out. 12/11; patient presents for follow-up. We have been using Keystone antibiotic ointment and Hydrofera Blue under Kerlix/Coban. This is the first time with a true compression wrap. She tolerated this well. She has no issues or complaints today. She was contacted by vein and vascular today for her venous reflux studies. She states she is going to call them back to schedule these. 12/18; patient presents for follow-up. We have been using Keystone antibiotic and Hydrofera Blue under 3 layer compression. She tolerated the wrap well. She has no issues or complaints today. She states she has scheduled her venous reflux studies. 12/26; patient presents for follow-up. We have been using Keystone antibiotic with Hydrofera Blue under 4-layer compression. She tolerated the increase in compression well. She has no issues or complaints today. 1/2; patient presents for follow-up. We have been using Keystone antibiotic with collagen under 4-layer compression. She has tolerated this well. She would like to return back to work. 1/8;  patient presents  for follow-up. We have been using Keystone antibiotic ointment with collagen under 4-layer compression. She has no issues or complaints Mogg, [name] Evans ([id number]) [id number] Page 2 of 7 today. She does not have the [facility] antibiotic ointment with her today. She has returned to work and reports no issues with this. 1/15; patient presents for follow-up left lower leg wound using Keystone and collagen under 4-layer compression. Patient with significant chronic venous insufficiency. She also tells me she has a history of DVT during a pregnancy several years ago at which time she had an IVC filter which has since been removed. 1/22; patient presents for follow-up. We have been using Prisma under 4-layer compression to the left lower extremity. She has no issues or complaints today. There is been improvement in wound healing. 1/29; patient presents for follow-up. We have been using Prisma under 4-layer compression to the left lower extremity. She has tolerated this well. There continues to be improvement in wound healing. She states she is scheduled for her venous reflux studies the second week in February. 2/5; patient presents for follow-up. We have been using collagen under 4-layer compression to the left lower extremity. The collagen stuck to the wound bed today. Despite this there continues to be improvement in wound healing. She has no issues or complaints today. 2/12; patient presents for follow-up. We have been using Xeroform under 4-layer compression. Her wound is healed. She has her compression stockings with her today. Electronic Signature(s) Signed: 12/28/2022 1:15:42 PM By: Kalman Shan DO Entered By: Kalman Shan on 12/28/2022 09:59:46 -------------------------------------------------------------------------------- Physical Exam Details Patient Name: Date of Service: [name] Evans, [name] Evans 12/28/2022 9:30 A M Medical Record Number:  [id number] Patient Account Number: [id number] Date of Birth/Sex: Treating RN: 1976/09/21 (47 y.o. F) Primary Care Provider: PA TIENT, NO Other Clinician: Referring Provider: Treating Provider/Extender: Yaakov Guthrie in Treatment: 13 Constitutional respirations regular, non-labored and within target range for patient.. Cardiovascular 2+ dorsalis pedis/posterior tibialis pulses. Psychiatric pleasant and cooperative. Notes Left lower extremity: Good edema control. Hemosiderin deposition on the medial left lower leg. Epithelialization to the previous wound site. No signs of infection. Electronic Signature(s) Signed: 12/28/2022 1:15:42 PM By: Kalman Shan DO Entered By: Kalman Shan on 12/28/2022 10:00:26 -------------------------------------------------------------------------------- Physician Orders Details Patient Name: Date of Service: NAIROBI, ROSTEK 12/28/2022 9:30 A M Medical Record Number: [id number] Patient Account Number: [id number] Date of Birth/Sex: Treating RN: 1976/07/21 (47 y.o. Donalda Ewings Primary Care Provider: PA Haig Prophet, [place] Other Clinician: Referring Provider: Treating Provider/Extender: Yaakov Guthrie in Treatment: 28 Verbal / Phone Orders: No Diagnosis Coding Mani, [name] Evans ([id number] Page 3 of 7 Discharge From [facility] Services Discharge from [place]!!!! Wear compression stockings every day! Call wound care center if needed in the future. Bathing/ Shower/ Hygiene May shower and wash wound with soap and water. Edema Control - Lymphedema / SCD / Other Elevate legs to the level of the heart or above for 30 minutes daily and/or when sitting for 3-4 times a day throughout the day. Avoid standing for long periods of time. Patient to wear own compression stockings every day. Exercise regularly Moisturize legs daily. Electronic Signature(s) Signed: 12/28/2022 1:15:42 PM By:  Kalman Shan DO Entered By: Kalman Shan on 12/28/2022 10:00:35 -------------------------------------------------------------------------------- Problem List Details Patient Name: Date of Service: KIRISTEN, SNELLING 12/28/2022 9:30 A M Medical Record Number: [id number] Patient Account Number: [id number] Date of Birth/Sex: Treating RN: 21-Mar-1976 (47 y.o. F) Primary Care Provider: PA TIENT, NO Other Clinician: Referring  Provider: Treating Provider/Extender: Yaakov Guthrie in Treatment: 13 Active Problems ICD-10 Encounter Code Description Active Date MDM Diagnosis L97.822 Non-pressure chronic ulcer of other part of left lower leg with fat layer 09/28/2022 No Yes exposed I87.312 Chronic venous hypertension (idiopathic) with ulcer of left lower extremity 09/28/2022 No Yes Inactive Problems Resolved Problems Electronic Signature(s) Signed: 12/28/2022 1:15:42 PM By: Kalman Shan DO Entered By: Kalman Shan on 12/28/2022 09:58:09 -------------------------------------------------------------------------------- Progress Note Details Patient Name: Date of Service: [name] Evans, [name] Evans 12/28/2022 9:30 A M Medical Record Number: [id number] Patient Account Number: [id number] Date of Birth/Sex: Treating RN: 04/03/1976 (47 y.o. F) Primary Care Provider: PA Haig Prophet, NO Other Clinician: Referring Provider: Treating Provider/Extender: Yaakov Guthrie in Treatment: [address] [name] Evans ([id number]) [id number] Page 4 of 7 Subjective Chief Complaint Information obtained from Patient 09/28/2022; left lower extremity wound History of Present Illness (HPI) Admission 09/28/2022 Ms. 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 She states that her mother had a  history of venous insufficiency with wounds as well. She has been using bacitracin to the wound bed. She has chronic pain to the area. She was seen in the ED for this issue on 10/21 and 10/24. She completed a 10-day course of doxycycline and Keflex for this issue. She states she works at [employer] and is on her feet for over 12 hours a day. She is not using compression therapy. She currently denies systemic signs of infection. 11/20; patient presents for follow-up. She has been using Medihoney and Hydrofera Blue to the wound bed. She reports improvement in pain however still has tenderness to the wound sites. She states that she has been elevating her leg and using an Ace bandage. She has been out of work for the past week. She had a PCR culture done at last clinic visit that grew coag negative staph and Candida parapsilosis. Keystone antibiotic ointment has been ordered. 11/27; patient presents for follow-up. She received the [facility] antibiotic ointment and started this 4 days ago. She states that the wound site is still very tender. She has been using an Ace wrap for compression as she cannot tolerate anything tighter. 12/4; patient presents for follow-up. She has been using Keystone antibiotic with Hydrofera Blue under Ace wrap. She states the chronic pain has improved. She brought in short-term disability papers to be filled out. 12/11; patient presents for follow-up. We have been using Keystone antibiotic ointment and Hydrofera Blue under Kerlix/Coban. This is the first time with a true compression wrap. She tolerated this well. She has no issues or complaints today. She was contacted by vein and vascular today for her venous reflux studies. She states she is going to call them back to schedule these. 12/18; patient presents for follow-up. We have been using Keystone antibiotic and Hydrofera Blue under 3 layer compression. She tolerated the wrap well. She has no issues or complaints today. She states  she has scheduled her venous reflux studies. 12/26; patient presents for follow-up. We have been using Keystone antibiotic with Hydrofera Blue under 4-layer compression. She tolerated the increase in compression well. She has no issues or complaints today. 1/2; patient presents for follow-up. We have been using Keystone antibiotic with collagen under 4-layer compression. She has tolerated this well. She would like to return back to work. 1/8; patient presents  for follow-up. We have been using Keystone antibiotic ointment with collagen under 4-layer compression. She has no issues or complaints today. She does not have the [facility] antibiotic ointment with her today. She has returned to work and reports no issues with this. 1/15; patient presents for follow-up left lower leg wound using Keystone and collagen under 4-layer compression. Patient with significant chronic venous insufficiency. She also tells me she has a history of DVT during a pregnancy several years ago at which time she had an IVC filter which has since been removed. 1/22; patient presents for follow-up. We have been using Prisma under 4-layer compression to the left lower extremity. She has no issues or complaints today. There is been improvement in wound healing. 1/29; patient presents for follow-up. We have been using Prisma under 4-layer compression to the left lower extremity. She has tolerated this well. There continues to be improvement in wound healing. She states she is scheduled for her venous reflux studies the second week in February. 2/5; patient presents for follow-up. We have been using collagen under 4-layer compression to the left lower extremity. The collagen stuck to the wound bed today. Despite this there continues to be improvement in wound healing. She has no issues or complaints today. 2/12; patient presents for follow-up. We have been using Xeroform under 4-layer compression. Her wound is healed. She has her  compression stockings with her today. Patient History Information obtained from Patient, Chart. Family History Unknown History, Cancer - Father, Hypertension - Mother, Lung Disease - Father, Thyroid Problems - Mother,Siblings, No family history of Tuberculosis. Social History Current every day smoker - e-ciggs, Alcohol Use - Never, Drug Use - No History, Caffeine Use - Rarely. Medical History Cardiovascular Patient has history of Peripheral Venous Disease Hospitalization/Surgery History - IVC filter. - tubal ligation. Medical A Surgical History Notes nd Hematologic/Lymphatic DVT Musculoskeletal scoliosis Objective Lubin, [name] Evans ([id number] Page 5 of 7 Constitutional respirations regular, non-labored and within target range for patient.. Vitals Time Taken: 9:40 AM, Height: 65 in, Weight: 115 lbs, BMI: 19.1, Temperature: 98.7 F, Pulse: 76 bpm, Respiratory Rate: 16 breaths/min, Blood Pressure: 170/94 mmHg. Cardiovascular 2+ dorsalis pedis/posterior tibialis pulses. Psychiatric pleasant and cooperative. General Notes: Left lower extremity: Good edema control. Hemosiderin deposition on the medial left lower leg. Epithelialization to the previous wound site. No signs of infection. Integumentary (Hair, Skin) Wound #1 status is Open. Original cause of wound was Laceration. The date acquired was: 04/16/2022. The wound has been in treatment 13 weeks. The wound is located on the Left,Medial Lower Leg. The wound measures 0cm length x 0cm width x 0cm depth; 0cm^2 area and 0cm^3 volume. There is Fat Layer (Subcutaneous Tissue) exposed. There is no tunneling or undermining noted. There is a none present amount of drainage noted. The wound margin is distinct with the outline attached to the wound base. There is no granulation within the wound bed. There is no necrotic tissue within the wound bed. The periwound skin appearance exhibited: Scarring,  Ecchymosis, Hemosiderin Staining. The periwound skin appearance did not exhibit: Callus, Crepitus, Excoriation, Induration, Rash, Dry/Scaly, Maceration, Atrophie Blanche, Cyanosis, Mottled, Pallor, Rubor, Erythema. Assessment Active Problems ICD-10 Non-pressure chronic ulcer of other part of left lower leg with fat layer exposed Chronic venous hypertension (idiopathic) with ulcer of left lower extremity Patient has done well with Xeroform under compression therapy. Her wound is healed. At this time I recommended compression stockings daily. She has them with her today. She is scheduled for her venous reflux  studies later this month. She knows to call with any questions or concerns. Plan Discharge From [facility] Services: Discharge from [place]!!!! Wear compression stockings every day! Call wound care center if needed in the future. Bathing/ Shower/ Hygiene: May shower and wash wound with soap and water. Edema Control - Lymphedema / SCD / Other: Elevate legs to the level of the heart or above for 30 minutes daily and/or when sitting for 3-4 times a day throughout the day. Avoid standing for long periods of time. Patient to wear own compression stockings every day. Exercise regularly Moisturize legs daily. 1. Compression stockings daily 2. Discharge from clinic due to closed wound 3. Follow-up as needed Electronic Signature(s) Signed: 12/28/2022 1:15:42 PM By: Kalman Shan DO Entered By: Kalman Shan on 12/28/2022 10:01:29 -------------------------------------------------------------------------------- HxROS Details Patient Name: Date of Service: [name] Evans, [name] Evans 12/28/2022 9:30 A M Medical Record Number: [id number] Patient Account Number: [id number] Date of Birth/Sex: Treating RN: 1976-11-05 (47 y.o. Ra Clendaniel, [name] Evans ([id number] Page 6 of 7 Primary Care Provider: PA Haig Prophet, NO Other Clinician: Referring  Provider: Treating Provider/Extender: Yaakov Guthrie in Treatment: 13 Information Obtained From Patient Chart Hematologic/Lymphatic Medical History: Past Medical History Notes: DVT Cardiovascular Medical History: Positive for: Peripheral Venous Disease Musculoskeletal Medical History: Past Medical History Notes: scoliosis Immunizations Pneumococcal Vaccine: Received Pneumococcal Vaccination: No Implantable Devices None Hospitalization / Surgery History Type of Hospitalization/Surgery IVC filter tubal ligation Family and Social History Unknown History: Yes; Cancer: Yes - Father; Hypertension: Yes - Mother; Lung Disease: Yes - Father; Thyroid Problems: Yes - Mother,Siblings; Tuberculosis: No; Current every day smoker - e-ciggs; Alcohol Use: Never; Drug Use: No History; Caffeine Use: Rarely; Financial Concerns: No; Food, Clothing or Shelter Needs: No; Support System Lacking: No; Transportation Concerns: No Electronic Signature(s) Signed: 12/28/2022 1:15:42 PM By: Kalman Shan DO Entered By: Kalman Shan on 12/28/2022 09:59:52 -------------------------------------------------------------------------------- SuperBill Details Patient Name: Date of Service: [name] Evans, [name] Evans 12/28/2022 Medical Record Number: [id number] Patient Account Number: [id number] Date of Birth/Sex: Treating RN: 10/21/1976 (47 y.o. F) Primary Care Provider: PA Haig Prophet, NO Other Clinician: Referring Provider: Treating Provider/Extender: Yaakov Guthrie in Treatment: 13 Diagnosis Coding ICD-10 Codes Code Description [phone] Non-pressure chronic ulcer of other part of left lower leg with fat layer exposed I87.312 Chronic venous hypertension (idiopathic) with ulcer of left lower extremity Facility Procedures : [name] Evans, [name] Evans Code: [id number] CQU[facility] ([id number]) Description: [name] Evans VISIT-LEV 3 EST PT [id number] Modifier: 25 [id number] Quantity: 1 [id number] Page 7  of 7 Physician Procedures : CPT4 Code Description Modifier [id number] - WC PHYS LEVEL 3 - EST PT ICD-10 Diagnosis Description L97.822 Non-pressure chronic ulcer of other part of left lower leg with fat layer exposed I87.312 Chronic venous hypertension (idiopathic) with ulcer  of left lower extremity Quantity: 1 Electronic Signature(s) Signed: 12/28/2022 1:15:42 PM By: Kalman Shan DO Signed: 12/28/2022 4:13:25 PM By: Sharyn Creamer RN, BSN Entered By: Sharyn Creamer on 12/28/2022 10:07:46

## 2022-12-28 NOTE — Progress Notes (Signed)
[name] Evans, [name] Evans ([id number] Page 1 of 8 Visit Report for 12/28/2022 Arrival Information Details Patient Name: Date of Service: [name] Evans, [name] Evans 12/28/2022 9:30 A M Medical Record Number: [id number] Patient Account Number: [id number] Date of Birth/Sex: Treating RN: Mar 07, 1976 (47 y.o. Donalda Ewings Primary Care Pualani Borah: PA Haig Prophet, NO Other Clinician: Referring Kairi Harshbarger: Treating Brynlea Spindler/Extender: Yaakov Guthrie in Treatment: 13 Visit Information History Since Last Visit Added or deleted any medications: No Patient Arrived: Ambulatory Any new allergies or adverse reactions: No Arrival Time: 09:41 Had a fall or experienced change in No Accompanied By: self activities of daily living that may affect Transfer Assistance: None risk of falls: Patient Identification Verified: Yes Signs or symptoms of abuse/neglect since last visito No Secondary Verification Process Completed: Yes Hospitalized since last visit: No Patient Requires Transmission-Based Precautions: No Implantable device outside of the clinic excluding No Patient Has Alerts: No cellular tissue based products placed in the center since last visit: Has Dressing in Place as Prescribed: Yes Has Compression in Place as Prescribed: Yes Pain Present Now: No Electronic Signature(s) Signed: 12/28/2022 4:13:25 PM By: Sharyn Creamer RN, BSN Entered By: Sharyn Creamer on 12/28/2022 09:41:53 -------------------------------------------------------------------------------- Clinic Level of Care Assessment Details Patient Name: Date of Service: [name] Evans, [name] Evans 12/28/2022 9:30 [place] Record Number: [id number] Patient Account Number: [id number] Date of Birth/Sex: Treating RN: 24-Apr-1976 (47 y.o. Donalda Ewings Primary Care Yidel Teuscher: PA TIENT, NO Other Clinician: Referring Shad Ledvina: Treating Alyissa Whidbee/Extender: Yaakov Guthrie in Treatment: 13 Clinic Level of Care  Assessment Items TOOL 4 Quantity Score X- 1 0 Use when only an EandM is performed on FOLLOW-UP visit ASSESSMENTS - Nursing Assessment / Reassessment X- 1 10 Reassessment of Co-morbidities (includes updates in patient status) X- 1 5 Reassessment of Adherence to Treatment Plan ASSESSMENTS - Wound and Skin A ssessment / Reassessment X - Simple Wound Assessment / Reassessment - one wound 1 5 []$  - 0 Complex Wound Assessment / Reassessment - multiple wounds []$  - 0 Dermatologic / Skin Assessment (not related to wound area) ASSESSMENTS - Focused Assessment X- 1 5 Circumferential Edema Measurements - multi extremities []$  - 0 Nutritional Assessment / Counseling / Intervention [name] Evans, [name] Evans ([id number] Page 2 of 8 []$  - 0 Lower Extremity Assessment (monofilament, tuning fork, pulses) []$  - 0 Peripheral Arterial Disease Assessment (using hand held doppler) ASSESSMENTS - Ostomy and/or Continence Assessment and Care []$  - 0 Incontinence Assessment and Management []$  - 0 Ostomy Care Assessment and Management (repouching, etc.) PROCESS - Coordination of Care X - Simple Patient / Family Education for ongoing care 1 15 []$  - 0 Complex (extensive) Patient / Family Education for ongoing care X- 1 10 Staff obtains Programmer, systems, Records, T Results / Process Orders est []$  - 0 Staff telephones HHA, Nursing Homes / Clarify orders / etc []$  - 0 Routine Transfer to another Facility (non-emergent condition) []$  - 0 Routine Hospital Admission (non-emergent condition) []$  - 0 New Admissions / Biomedical engineer / Ordering NPWT Apligraf, etc. , []$  - 0 Emergency Hospital Admission (emergent condition) X- 1 10 Simple Discharge Coordination []$  - 0 Complex (extensive) Discharge Coordination PROCESS - Special Needs []$  - 0 Pediatric / Minor Patient Management []$  - 0 Isolation Patient Management []$  - 0 Hearing / Language / Visual special needs []$  -  0 Assessment of Community assistance (transportation, [name] Evans/C planning, etc.) []$  - 0 Additional assistance / Altered mentation []$  - 0 Support Surface(s) Assessment (bed, cushion, seat, etc.) INTERVENTIONS - Wound Cleansing / Measurement  X - Simple Wound Cleansing - one wound 1 5 []$  - 0 Complex Wound Cleansing - multiple wounds X- 1 5 Wound Imaging (photographs - any number of wounds) []$  - 0 Wound Tracing (instead of photographs) X- 1 5 Simple Wound Measurement - one wound []$  - 0 Complex Wound Measurement - multiple wounds INTERVENTIONS - Wound Dressings []$  - 0 Small Wound Dressing one or multiple wounds []$  - 0 Medium Wound Dressing one or multiple wounds []$  - 0 Large Wound Dressing one or multiple wounds []$  - 0 Application of Medications - topical []$  - 0 Application of Medications - injection INTERVENTIONS - Miscellaneous []$  - 0 External ear exam []$  - 0 Specimen Collection (cultures, biopsies, blood, body fluids, etc.) []$  - 0 Specimen(s) / Culture(s) sent or taken to Lab for analysis []$  - 0 Patient Transfer (multiple staff / Civil Service fast streamer / Similar devices) []$  - 0 Simple Staple / Suture removal (25 or less) []$  - 0 Complex Staple / Suture removal (26 or more) []$  - 0 Hypo / Hyperglycemic Management (close monitor of Blood Glucose) Goedken, Carrol ([id number].pdf Page 3 of 8 []$  - 0 Ankle / Brachial Index (ABI) - do not check if billed separately X- 1 5 Vital Signs Has the patient been seen at the hospital within the last three years: Yes Total Score: 80 Level Of Care: New/Established - Level 3 Electronic Signature(s) Signed: 12/28/2022 4:13:25 PM By: Sharyn Creamer RN, BSN Entered By: Sharyn Creamer on 12/28/2022 10:07:28 -------------------------------------------------------------------------------- Encounter Discharge Information Details Patient Name: Date of Service: [name] Evans, [name] Evans 12/28/2022 9:30 [place] Record Number:  [id number] Patient Account Number: [id number] Date of Birth/Sex: Treating RN: 28-Apr-1976 (47 y.o. Donalda Ewings Primary Care Willow Reczek: PA Haig Prophet, NO Other Clinician: Referring Lani Havlik: Treating Bridgette Wolden/Extender: Yaakov Guthrie in Treatment: 13 Encounter Discharge Information Items Discharge Condition: Stable Ambulatory Status: Ambulatory Discharge Destination: Home Transportation: Private Auto Accompanied By: self Schedule Follow-up Appointment: Yes Clinical Summary of Care: Patient Declined Electronic Signature(s) Signed: 12/28/2022 4:13:25 PM By: Sharyn Creamer RN, BSN Entered By: Sharyn Creamer on 12/28/2022 10:02:11 -------------------------------------------------------------------------------- Lower Extremity Assessment Details Patient Name: Date of Service: [name] Evans, [name] Evans 12/28/2022 9:30 A M Medical Record Number: [id number] Patient Account Number: [id number] Date of Birth/Sex: Treating RN: [birth year] (47 y.o. Donalda Ewings Primary Care Chee Dimon: PA Haig Prophet, NO Other Clinician: Referring Greogory Cornette: Treating Shastina Rua/Extender: Yaakov Guthrie in Treatment: 13 Edema Assessment Assessed: [Left: No] [Right: No] Edema: [Left: N] [Right: o] Calf Left: Right: Point of Measurement: 28 cm From Medial Instep 31 cm Ankle Left: Right: Point of Measurement: 10 cm From Medial Instep 2 cm Vascular Assessment Left: [id number] Page 4 of 8Right:] Pulses: Dorsalis Pedis Palpable: [id number] Page 4 of 8Yes] Electronic Signature(s) Signed: 12/28/2022 4:13:25 PM By: Sharyn Creamer RN, BSN Entered By: Sharyn Creamer on 12/28/2022 09:41:24 -------------------------------------------------------------------------------- Multi Wound Chart Details Patient Name: Date of Service: [name] Evans, [name] Evans 12/28/2022 9:30 A M Medical Record Number: [id number] Patient Account Number: [id number] Date of Birth/Sex: Treating  RN: [birth year] (47 y.o. F) Primary Care Rogelio Waynick: PA TIENT, NO Other Clinician: Referring Roshan Roback: Treating Khya Halls/Extender: Yaakov Guthrie in Treatment: 13 Vital Signs Height(in): 65 Pulse(bpm): 78 Weight(lbs): 115 Blood Pressure(mmHg): 170/94 Body Mass Index(BMI): 19.1 Temperature(F): 98.7 Respiratory Rate(breaths/min): 16 [1:Photos:] [N/A:N/A] Left, Medial Lower Leg N/A N/A Wound Location: Laceration N/A N/A Wounding Event: Venous Leg Ulcer N/A N/A Primary Etiology: Peripheral Venous Disease N/A N/A Comorbid History: 04/16/2022 N/A N/A Date Acquired: 54 N/A N/A Weeks of  Treatment: Open N/A N/A Wound Status: No N/A N/A Wound Recurrence: 0x0x0 N/A N/A Measurements L x W x [name] Evans (cm) 0 N/A N/A A (cm) : rea 0 N/A N/A Volume (cm) : 100.00% N/A N/A % Reduction in A rea: 100.00% N/A N/A % Reduction in Volume: Full Thickness Without Exposed N/A N/A Classification: Support Structures None Present N/A N/A Exudate Amount: Distinct, outline attached N/A N/A Wound Margin: None Present (0%) N/A N/A Granulation Amount: None Present (0%) N/A N/A Necrotic Amount: Fat Layer (Subcutaneous Tissue): Yes N/A N/A Exposed Structures: Fascia: No Tendon: No Muscle: No Joint: No Bone: No Large (67-100%) N/A N/A Epithelialization: Scarring: Yes N/A N/A Periwound Skin Texture: Excoriation: No Induration: No Callus: No Crepitus: No Rash: No Maceration: No N/A N/A Periwound Skin Moisture: Dry/Scaly: No Nutter, [name] ([id number] Page 5 of 8 Ecchymosis: Yes N/A N/A Periwound Skin Color: Hemosiderin Staining: Yes Atrophie Blanche: No Cyanosis: No Erythema: No Mottled: No Pallor: No Rubor: No Treatment Notes Electronic Signature(s) Signed: 12/28/2022 1:15:42 PM By: Kalman Shan DO Entered By: Kalman Shan on 12/28/2022  09:58:14 -------------------------------------------------------------------------------- Multi-Disciplinary Care Plan Details Patient Name: Date of Service: [name] Evans, [name] Evans 12/28/2022 9:30 A M Medical Record Number: [id number] Patient Account Number: [id number] Date of Birth/Sex: Treating RN: [birth year] (47 y.o. Donalda Ewings Primary Care Montavious Wierzba: PA Haig Prophet, [place] Other Clinician: Referring Sahaana Weitman: Treating Jeani Fassnacht/Extender: Yaakov Guthrie in Treatment: 13 Active Inactive Electronic Signature(s) Signed: 12/28/2022 4:13:25 PM By: Sharyn Creamer RN, BSN Entered By: Sharyn Creamer on 12/28/2022 09:52:38 -------------------------------------------------------------------------------- Pain Assessment Details Patient Name: Date of Service: [name] Evans, [name] Evans 12/28/2022 9:30 A M Medical Record Number: [id number] Patient Account Number: [id number] Date of Birth/Sex: Treating RN: [birth year] (47 y.o. Donalda Ewings Primary Care Alyx Mcguirk: PA Haig Prophet, [place] Other Clinician: Referring Mintie Witherington: Treating Marielle Mantione/Extender: Yaakov Guthrie in Treatment: 13 Active Problems Location of Pain Severity and Description of Pain Patient Has Paino No Site Locations [place], [place] ([id number]) [id number] Page 6 of 8 Pain Management and Medication Current Pain Management: Electronic Signature(s) Signed: 12/28/2022 4:13:25 PM By: Sharyn Creamer RN, BSN Entered By: Sharyn Creamer on 12/28/2022 09:42:06 -------------------------------------------------------------------------------- Patient/Caregiver Education Details Patient Name: Date of Service: [name] Evans 2/12/2024andnbsp9:30 [place] Record Number: [id number] Patient Account Number: [id number] Date of Birth/Gender: Treating RN: July 03, 1976 (47 y.o. Donalda Ewings Primary Care Physician: PA Haig Prophet, NO Other Clinician: Referring Physician: Treating Physician/Extender: Yaakov Guthrie in  Treatment: 13 Education Assessment Education Provided To: Patient Education Topics Provided Venous: Methods: Explain/Verbal Responses: State content correctly Wound/Skin Impairment: Methods: Explain/Verbal Responses: State content correctly Electronic Signature(s) Signed: 12/28/2022 4:13:25 PM By: Sharyn Creamer RN, BSN Entered By: Sharyn Creamer on 12/28/2022 09:53:03 -------------------------------------------------------------------------------- Wound Assessment Details Patient Name: Date of Service: [name] Evans, [name] Evans 12/28/2022 9:30 A [name] Evans ([id number] Page 7 of 8 Medical Record Number: [id number] Patient Account Number: [id number] Date of Birth/Sex: Treating RN: 03-12-1976 (47 y.o. Donalda Ewings Primary Care Nashea Chumney: PA TIENT, NO Other Clinician: Referring Intisar Claudio: Treating Shanetta Nicolls/Extender: Yaakov Guthrie in Treatment: 13 Wound Status Wound Number: 1 Primary Etiology: Venous Leg Ulcer Wound Location: Left, Medial Lower Leg Wound Status: Open Wounding Event: Laceration Comorbid History: Peripheral Venous Disease Date Acquired: 04/16/2022 Weeks Of Treatment: 13 Clustered Wound: No Photos Wound Measurements Length: (cm) Width: (cm) Depth: (cm) Area: (cm) Volume: (cm) 0 % Reduction in Area: 100% 0 % Reduction in Volume: 100% 0 Epithelialization: Large (67-100%) 0 Tunneling: No 0 Undermining: No Wound Description Classification: Full Thickness Without  Exposed Suppor Wound Margin: Distinct, outline attached Exudate Amount: None Present t Structures Foul Odor After Cleansing: No Slough/Fibrino No Wound Bed Granulation Amount: None Present (0%) Exposed Structure Necrotic Amount: None Present (0%) Fascia Exposed: No Fat Layer (Subcutaneous Tissue) Exposed: Yes Tendon Exposed: No Muscle Exposed: No Joint Exposed: No Bone Exposed: No Periwound Skin Texture Texture Color No Abnormalities Noted:  No No Abnormalities Noted: No Callus: No Atrophie Blanche: No Crepitus: No Cyanosis: No Excoriation: No Ecchymosis: Yes Induration: No Erythema: No Rash: No Hemosiderin Staining: Yes Scarring: Yes Mottled: No Pallor: No Moisture Rubor: No No Abnormalities Noted: No Dry / Scaly: No Maceration: No Electronic Signature(s) Signed: 12/28/2022 4:13:25 PM By: Sharyn Creamer RN, BSN Entered By: Sharyn Creamer on 12/28/2022 09:46:43 Baer, [name] ([id number] Page 8 of 8 -------------------------------------------------------------------------------- Vitals Details Patient Name: Date of Service: [name] Evans, DICECCO 12/28/2022 9:30 A M Medical Record Number: [id number] Patient Account Number: [id number] Date of Birth/Sex: Treating RN: 04-06-1976 (47 y.o. Donalda Ewings Primary Care Diallo Ponder: PA TIENT, NO Other Clinician: Referring Katheleen Stella: Treating Selin Eisler/Extender: Yaakov Guthrie in Treatment: 13 Vital Signs Time Taken: 09:40 Temperature (F): 98.7 Height (in): 65 Pulse (bpm): 76 Weight (lbs): 115 Respiratory Rate (breaths/min): 16 Body Mass Index (BMI): 19.1 Blood Pressure (mmHg): 170/94 Reference Range: 80 - 120 mg / dl Electronic Signature(s) Signed: 12/28/2022 4:13:25 PM By: Sharyn Creamer RN, BSN Entered By: Sharyn Creamer on 12/28/2022 09:44:43

## 2023-01-04 ENCOUNTER — Encounter (HOSPITAL_BASED_OUTPATIENT_CLINIC_OR_DEPARTMENT_OTHER): Payer: BC Managed Care – PPO | Admitting: Internal Medicine

## 2023-01-04 NOTE — Progress Notes (Signed)
[name], Evans ([id number]) [id number] Page 1 of 7 Visit Report for 11/30/2022 Arrival Information Details Patient Name: Date of Service: [name] Evans, [name] Evans 11/30/2022 10:15 A M Medical Record Number: [id number] Patient Account Number: [id number] Date of Birth/Sex: Treating RN: 1976/06/29 (47 y.o. F) Primary Care Javell Blackburn: PA TIENT, NO Other Clinician: Referring Dalesha Stanback: Treating Shelia Magallon/Extender: [name] in Treatment: 9 Visit Information History Since Last Visit Added or deleted any medications: No Patient Arrived: Ambulatory Any new allergies or adverse reactions: No Arrival Time: 10:11 Had a fall or experienced change in No Accompanied By: self activities of daily living that may affect Transfer Assistance: None risk of falls: Patient Identification Verified: Yes Signs or symptoms of abuse/neglect since last visito No Secondary Verification Process Completed: Yes Hospitalized since last visit: No Patient Requires Transmission-Based Precautions: No Implantable device outside of the clinic excluding No Patient Has Alerts: No cellular tissue based products placed in the center since last visit: Has Compression in Place as Prescribed: Yes Pain Present Now: No Electronic Signature(s) Signed: 11/30/2022 3:27:15 PM By: Erenest Blank Entered By: Erenest Blank on 11/30/2022 10:11:59 -------------------------------------------------------------------------------- Compression Therapy Details Patient Name: Date of Service: [name] Evans, [name] Evans 11/30/2022 10:15 A M Medical Record Number: [id number] Patient Account Number: [id number] Date of Birth/Sex: Treating RN: [birth year] (47 y.o. Benjaman Evans Primary Care Nyella Eckels: PA Haig Prophet, [place] Other Clinician: Referring Montie Gelardi: Treating [name] Evans/Extender: [name] in Treatment: 9 Compression Therapy Performed for Wound Assessment: Wound #1 Left,Medial Lower Leg Performed By:  Clinician Rhae Hammock, RN Compression Type: Four Layer Post Procedure Diagnosis Same as Pre-procedure Electronic Signature(s) Signed: 01/04/2023 10:40:58 AM By: Rhae Hammock RN Entered By: Rhae Hammock on 11/30/2022 10:45:23 Radabaugh, [name] ([id number]) [id number] Page 2 of 7 -------------------------------------------------------------------------------- Encounter Discharge Information Details Patient Name: Date of Service: [name], Evans 11/30/2022 10:15 A M Medical Record Number: [id number] Patient Account Number: [id number] Date of Birth/Sex: Treating RN: Jun 11, 1976 (47 y.o. Tonita Evans, [name] Primary Care Adams Hinch: PA Haig Prophet, NO Other Clinician: Referring [name] Evans: Treating Belinda Bringhurst/Extender: [name] in Treatment: 9 Encounter Discharge Information Items Discharge Condition: Stable Ambulatory Status: Ambulatory Discharge Destination: Home Transportation: Private Auto Accompanied By: self Schedule Follow-up Appointment: Yes Clinical Summary of Care: Patient Declined Electronic Signature(s) Signed: 01/04/2023 10:40:58 AM By: Rhae Hammock RN Entered By: Rhae Hammock on 11/30/2022 10:47:19 -------------------------------------------------------------------------------- Lower Extremity Assessment Details Patient Name: Date of Service: [name] Evans, [name] Evans 11/30/2022 10:15 A M Medical Record Number: [id number] Patient Account Number: [id number] Date of Birth/Sex: Treating RN: 1976/09/09 (47 y.o. F) Primary Care Hinata Diener: PA Haig Prophet, NO Other Clinician: Referring Poseidon Pam: Treating [name] Evans/Extender: [name] in Treatment: 9 Edema Assessment Assessed: [Left: No] [Right: No] Edema: [Left: N] [Right: o] Calf Left: Right: Point of Measurement: 28 cm From Medial Instep 32 cm Ankle Left: Right: Point of Measurement: 10 cm From Medial Instep 21.5 cm Electronic Signature(s) Signed: 11/30/2022 3:27:15 PM  By: Erenest Blank Entered By: Erenest Blank on 11/30/2022 10:33:11 -------------------------------------------------------------------------------- Multi Wound Chart Details Patient Name: Date of Service: [name] Evans, [name] Evans 11/30/2022 10:15 A M Medical Record Number: [id number] Patient Account Number: [id number] Date of Birth/Sex: Treating RN: June 23, 1976 (47 y.o. F) Primary Care [name]: PA Haig Prophet, NO Other Clinician: Referring Lyna Laningham: Treating [name] Evans/Extender: [name] in Treatment: [address] [name] ([id number]) [id number] Page 3 of 7 Vital Signs Height(in): 65 Pulse(bpm): 70 Weight(lbs): 115 Blood Pressure(mmHg): 154/89 Body Mass Index(BMI): 19.1 Temperature(F): 99 Respiratory Rate(breaths/min): 18 [1:Photos:] [N/A:N/A] Left, Medial Lower Leg N/A N/A Wound Location: Laceration N/A N/A  Wounding Event: Venous Leg Ulcer N/A N/A Primary Etiology: Peripheral Venous Disease N/A N/A Comorbid History: 04/16/2022 N/A N/A Date Acquired: 9 N/A N/A Weeks of Treatment: Open N/A N/A Wound Status: No N/A N/A Wound Recurrence: 2.8x0.8x0.1 N/A N/A Measurements L x W x D (cm) 1.759 N/A N/A A (cm) : rea 0.176 N/A N/A Volume (cm) : 92.00% N/A N/A % Reduction in A rea: 96.00% N/A N/A % Reduction in Volume: Full Thickness Without Exposed N/A N/A Classification: Support Structures Medium N/A N/A Exudate Amount: Serosanguineous N/A N/A Exudate Type: red, brown N/A N/A Exudate Color: Distinct, outline attached N/A N/A Wound Margin: Large (67-100%) N/A N/A Granulation Amount: Red N/A N/A Granulation Quality: None Present (0%) N/A N/A Necrotic Amount: Fat Layer (Subcutaneous Tissue): Yes N/A N/A Exposed Structures: Fascia: No Tendon: No Muscle: No Joint: No Bone: No Medium (34-66%) N/A N/A Epithelialization: Scarring: Yes N/A N/A Periwound Skin Texture: Excoriation: No Induration: No Callus: No Crepitus: No Rash:  No Maceration: No N/A N/A Periwound Skin Moisture: Dry/Scaly: No Ecchymosis: Yes N/A N/A Periwound Skin Color: Hemosiderin Staining: Yes Atrophie Blanche: No Cyanosis: No Erythema: No Mottled: No Pallor: No Rubor: No Compression Therapy N/A N/A Procedures Performed: Treatment Notes Wound #1 (Lower Leg) Wound Laterality: Left, Medial Cleanser Soap and Water Discharge Instruction: May shower and wash wound with dial antibacterial soap and water prior to dressing change. Wound Cleanser Discharge Instruction: Cleanse the wound with wound cleanser prior to applying a clean dressing using gauze sponges, not tissue or cotton balls. Peri-Wound Care Triamcinolone 15 (g) Discharge Instruction: Use triamcinolone 15 (g) as directed Sween Lotion (Moisturizing lotion) Masters, Elliyah ([id number]) [id number] Page 4 of 7 Discharge Instruction: Apply moisturizing lotion as directed Topical Primary Dressing Promogran Prisma Matrix, 4.34 (sq in) (silver collagen) Discharge Instruction: Moisten collagen with saline or hydrogel Secondary Dressing ABD Pad, 5x9 Discharge Instruction: Apply over primary dressing as directed. Woven Gauze Sponge, Non-Sterile 4x4 in Discharge Instruction: Apply over primary dressing as directed. Secured With Compression Wrap FourPress (4 layer compression wrap) Discharge Instruction: Apply four layer compression. Compression Stockings Add-Ons Electronic Signature(s) Signed: 11/30/2022 3:35:21 PM By: Linton Ham MD Entered By: Linton Ham on 11/30/2022 10:56:04 -------------------------------------------------------------------------------- Multi-Disciplinary Care Plan Details Patient Name: Date of Service: [name] Evans, [name] Evans Katherine Basset 11/30/2022 10:15 A M Medical Record Number: [id number] Patient Account Number: [id number] Date of Birth/Sex: Treating RN: 02/01/1976 (47 y.o. Tonita Evans, [name] Primary Care Chakara Bognar: PA Haig Prophet, NO Other  Clinician: Referring Vernel Donlan: Treating Marrisa Kimber/Extender: [name] in Treatment: 9 Active Inactive Wound/Skin Impairment Nursing Diagnoses: Impaired tissue integrity Knowledge deficit related to ulceration/compromised skin integrity Goals: Patient will have a decrease in wound volume by X% from date: (specify in notes) Date Initiated: 09/16/2022 Target Resolution Date: 12/11/2022 Goal Status: Active Patient/caregiver will verbalize understanding of skin care regimen Date Initiated: 09/16/2022 Target Resolution Date: 12/11/2022 Goal Status: Active Ulcer/skin breakdown will have a volume reduction of 30% by week 4 Date Initiated: 09/16/2022 Target Resolution Date: 12/12/2022 Goal Status: Active Interventions: Assess patient/caregiver ability to obtain necessary supplies Assess patient/caregiver ability to perform ulcer/skin care regimen upon admission and as needed Assess ulceration(s) every visit Notes: [name] Evans, [name] Evans ([id number]) [id number] Page 5 of 7 Electronic Signature(s) Signed: 01/04/2023 10:40:58 AM By: Rhae Hammock RN Entered By: Rhae Hammock on 11/30/2022 10:43:00 -------------------------------------------------------------------------------- Pain Assessment Details Patient Name: Date of Service: [name] Evans, [name] Evans 11/30/2022 10:15 A M Medical Record Number: [id number] Patient Account Number: [id number] Date of Birth/Sex: Treating RN: [birth year] (47 y.o. F) Primary Care Blase Beckner: PA  Haig Prophet, NO Other Clinician: Referring Nyiah Pianka: Treating Kourtlynn Trevor/Extender: [name] in Treatment: 9 Active Problems Location of Pain Severity and Description of Pain Patient Has Paino No Site Locations Pain Management and Medication Current Pain Management: Electronic Signature(s) Signed: 11/30/2022 3:27:15 PM By: Erenest Blank Entered By: Erenest Blank on 11/30/2022  10:23:41 -------------------------------------------------------------------------------- Patient/Caregiver Education Details Patient Name: Date of Service: Norm Salt 1/15/2024andnbsp10:15 A M Medical Record Number: [id number] Patient Account Number: [id number] Date of Birth/Gender: Treating RN: [birth year] (47 y.o. Benjaman Evans Primary Care Physician: PA Haig Prophet, [place] Other Clinician: Referring Physician: Treating Physician/Extender: [name] in Treatment: 9 Education Assessment Education Provided To: Patient TAYLA, FRESCO ([id number]) [id number] Page 6 of 7 Education Topics Provided Wound/Skin Impairment: Methods: Explain/Verbal Responses: Reinforcements needed, State content correctly Electronic Signature(s) Signed: 01/04/2023 10:40:58 AM By: Rhae Hammock RN Entered By: Rhae Hammock on 11/30/2022 10:43:13 -------------------------------------------------------------------------------- Wound Assessment Details Patient Name: Date of Service: [name] Evans, [name] Evans 11/30/2022 10:15 A M Medical Record Number: [id number] Patient Account Number: [id number] Date of Birth/Sex: Treating RN: 1976/03/24 (47 y.o. F) Primary Care Kelcey Wickstrom: PA TIENT, NO Other Clinician: Referring Dolph Tavano: Treating Curtisha Bendix/Extender: [name] in Treatment: 9 Wound Status Wound Number: 1 Primary Etiology: Venous Leg Ulcer Wound Location: Left, Medial Lower Leg Wound Status: Open Wounding Event: Laceration Comorbid History: Peripheral Venous Disease Date Acquired: 04/16/2022 Weeks Of Treatment: 9 Clustered Wound: No Photos Wound Measurements Length: (cm) 2.8 Width: (cm) 0.8 Depth: (cm) 0.1 Area: (cm) 1.759 Volume: (cm) 0.176 % Reduction in Area: 92% % Reduction in Volume: 96% Epithelialization: Medium (34-66%) Tunneling: No Undermining: No Wound Description Classification: Full Thickness Without Exposed Suppor Wound  Margin: Distinct, outline attached Exudate Amount: Medium Exudate Type: Serosanguineous Exudate Color: red, brown t Structures Foul Odor After Cleansing: No Slough/Fibrino No Wound Bed Granulation Amount: Large (67-100%) Exposed Structure Granulation Quality: Red Fascia Exposed: No Necrotic Amount: None Present (0%) Fat Layer (Subcutaneous Tissue) Exposed: Yes Tendon Exposed: No Muscle Exposed: No Joint Exposed: No Bone Exposed: No [name] Evans, [name] Evans ([id number]) [id number] Page 7 of 7 Periwound Skin Texture Texture Color No Abnormalities Noted: No No Abnormalities Noted: No Callus: No Atrophie Blanche: No Crepitus: No Cyanosis: No Excoriation: No Ecchymosis: Yes Induration: No Erythema: No Rash: No Hemosiderin Staining: Yes Scarring: Yes Mottled: No Pallor: No Moisture Rubor: No No Abnormalities Noted: No Dry / Scaly: No Maceration: No Electronic Signature(s) Signed: 11/30/2022 3:27:15 PM By: Erenest Blank Entered By: Erenest Blank on 11/30/2022 10:34:39 -------------------------------------------------------------------------------- Vitals Details Patient Name: Date of Service: MONDA, [name] Evans 11/30/2022 10:15 A M Medical Record Number: [id number] Patient Account Number: [id number] Date of Birth/Sex: Treating RN: 01-Aug-1976 (47 y.o. F) Primary Care Dailey Buccheri: PA TIENT, NO Other Clinician: Referring Jetaime Pinnix: Treating Kunal Levario/Extender: [name] in Treatment: 9 Vital Signs Time Taken: 10:23 Temperature (F): 99 Height (in): 65 Pulse (bpm): 70 Weight (lbs): 115 Respiratory Rate (breaths/min): 18 Body Mass Index (BMI): 19.1 Blood Pressure (mmHg): 154/89 Reference Range: 80 - 120 mg / dl Electronic Signature(s) Signed: 11/30/2022 3:27:15 PM By: Erenest Blank Entered By: Erenest Blank on 11/30/2022 10:23:34

## 2023-01-04 NOTE — Progress Notes (Signed)
[name] Evans, [name] Evans ([id number]) [id number] Page 1 of 6 Visit Report for 11/30/2022 HPI Details Patient Name: Date of Service: [name] Evans, [name] Evans 11/30/2022 10:15 A M Medical Record Number: [id number] Patient Account Number: [id number] Date of Birth/Sex: Treating RN: 06-30-1976 (47 y.o. 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 She states that her mother had a history of venous insufficiency with wounds as well. She has been using bacitracin to the wound bed. She has chronic pain to the area. She was seen in the ED for this issue on 10/21 and 10/24. She completed a 10-day course of doxycycline and Keflex for this issue. She states she works at [employer] and is on her feet for over 12 hours a day. She is not using compression therapy. She currently denies systemic signs of infection. 11/20; patient presents for follow-up. She has been using Medihoney and Hydrofera Blue to the wound bed. She reports improvement in pain however still has tenderness to the wound sites. She states that she has been elevating her leg and using an Ace bandage. She has been out of work for the past week. She had a PCR culture done at last clinic visit that grew coag negative staph and Candida parapsilosis. Keystone antibiotic ointment has been ordered. 11/27; patient presents for follow-up. She received the [facility] antibiotic ointment and started this 4 days ago. She states that the wound site is still very tender. She has been using an Ace wrap for compression as she cannot tolerate anything  tighter. 12/4; patient presents for follow-up. She has been using Keystone antibiotic with Hydrofera Blue under Ace wrap. She states the chronic pain has improved. She brought in short-term disability papers to be filled out. 12/11; patient presents for follow-up. We have been using Keystone antibiotic ointment and Hydrofera Blue under Kerlix/Coban. This is the first time with a true compression wrap. She tolerated this well. She has no issues or complaints today. She was contacted by vein and vascular today for her venous reflux studies. She states she is going to call them back to schedule these. 12/18; patient presents for follow-up. We have been using Keystone antibiotic and Hydrofera Blue under 3 layer compression. She tolerated the wrap well. She has no issues or complaints today. She states she has scheduled her venous reflux studies. 12/26; patient presents for follow-up. We have been using Keystone antibiotic with Hydrofera Blue under 4-layer compression. She tolerated the increase in compression well. She has no issues or complaints today. 1/2; patient presents for follow-up. We have been using Keystone antibiotic with collagen under 4-layer compression. She has tolerated this well. She would like to return back to work. 1/8; patient presents for follow-up. We have been using Keystone antibiotic ointment with collagen under 4-layer compression. She has no issues or complaints today. She does not have the [facility] antibiotic ointment with her today. She has returned to work and reports no issues with this. 1/15; patient presents for follow-up left lower leg wound using Keystone and collagen under 4-layer compression. Patient with significant chronic venous insufficiency. She also tells me she has a history of DVT during a pregnancy several years ago at  which time she had an IVC filter which has since been removed. Electronic Signature(s) Signed: 11/30/2022 3:35:21 PM By: Linton Ham  MD Entered By: Linton Ham on 11/30/2022 10:57:17 -------------------------------------------------------------------------------- Physical Exam Details Patient Name: Date of Service: [name] Evans, [name] Evans 11/30/2022 10:15 A M Medical Record Number: [id number] Patient Account Number: [id number] Date of Birth/Sex: Treating RN: Mar 25, 1976 (47 y.o. F) Primary Care Provider: PA Haig Prophet, NO Other Clinician: Referring Provider: Treating Provider/Extender: [name] in Treatment: [address] [name] ([id number]) [id number] Page 2 of 6 Constitutional Patient is hypertensive.. Pulse regular and within target range for patient.Marland Kitchen Respirations regular, non-labored and within target range.. Temperature is normal and within the target range for the patient.Marland Kitchen Appears in no distress. Notes Wound exam; left lower extremity. Her edema control is very good. Hemosiderin deposition on the medial left lower leg. Wound surface looks excellent. Nice healthy granulation. No evidence of infection Electronic Signature(s) Signed: 11/30/2022 3:35:21 PM By: Linton Ham MD Entered By: Linton Ham on 11/30/2022 10:59:54 -------------------------------------------------------------------------------- Physician Orders Details Patient Name: Date of Service: [name] Evans, [name] Evans 11/30/2022 10:15 A M Medical Record Number: [id number] Patient Account Number: [id number] Date of Birth/Sex: Treating RN: [birth year] (47 y.o. Benjaman Lobe Primary Care Provider: PA Haig Prophet, NO Other Clinician: Referring Provider: Treating Provider/Extender: [name] in Treatment: 9 Verbal / Phone Orders: No Diagnosis Coding Follow-up Appointments ppointment in 1 week. - w/ Dr. Heber [place] Monday 12/07/22 @ 1015 (already has appt.) Return A ppointment in 2 weeks. - Dr. Heber [place] (go ahead and schedule) Return A Other: - Continue to bring in topical antibiotics at each appt time. ***May  return to work full time with no restrictions.**** Anesthetic (In clinic) Topical Lidocaine 5% applied to wound bed Bathing/ Shower/ Hygiene May shower with protection but do not get wound dressing(s) wet. Protect dressing(s) with water repellant cover (for example, large plastic bag) or a cast cover and may then take shower. Edema Control - Lymphedema / SCD / Other Avoid standing for long periods of time. Wound Treatment Wound #1 - Lower Leg Wound Laterality: Left, Medial Cleanser: Soap and Water 1 x Per Week/30 Days Discharge Instructions: May shower and wash wound with dial antibacterial soap and water prior to dressing change. Cleanser: Wound Cleanser (Generic) 1 x Per Week/30 Days Discharge Instructions: Cleanse the wound with wound cleanser prior to applying a clean dressing using gauze sponges, not tissue or cotton balls. Peri-Wound Care: Triamcinolone 15 (g) 1 x Per Week/30 Days Discharge Instructions: Use triamcinolone 15 (g) as directed Peri-Wound Care: Sween Lotion (Moisturizing lotion) 1 x Per Week/30 Days Discharge Instructions: Apply moisturizing lotion as directed Prim Dressing: Promogran Prisma Matrix, 4.34 (sq in) (silver collagen) 1 x Per Week/30 Days ary Discharge Instructions: Moisten collagen with saline or hydrogel Secondary Dressing: ABD Pad, 5x9 (Generic) 1 x Per Week/30 Days Discharge Instructions: Apply over primary dressing as directed. Secondary Dressing: Woven Gauze Sponge, Non-Sterile 4x4 in (Generic) 1 x Per Week/30 Days Discharge Instructions: Apply over primary dressing as directed. Compression Wrap: FourPress (4 layer compression wrap) 1 x Per Week/30 Days Discharge Instructions: Apply four layer compression. [name] Evans, [name] Evans ([id number]) [id number] Page 3 of 6 Electronic Signature(s) Signed: 11/30/2022 3:35:21 PM By: Linton Ham MD Signed: 01/04/2023 10:40:58 AM By: Rhae Hammock RN Entered By: Rhae Hammock on  11/30/2022 10:44:25 -------------------------------------------------------------------------------- Problem List Details Patient Name: Date of Service: [name] Evans, [name] Evans 11/30/2022 10:15 A M Medical Record Number: [id number] Patient Account Number: [id number] Date of Birth/Sex: Treating RN: [birth year] (47 y.o.  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 F) Primary Care Provider: PA Haig Prophet, NO Other Clinician: Referring Provider: Treating Provider/Extender: [name] in Treatment: 9 Subjective History of Present Illness (HPI) Admission 09/28/2022 Ms. 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 She states that her mother had a history of venous insufficiency with wounds as well. She has been using bacitracin to the wound bed. She has chronic pain to the area. She was seen in the ED for this issue on 10/21 and 10/24. She completed a 10-day course of  doxycycline and Keflex for this issue. She states she works at [employer] and is on her feet for over 12 hours a day. She is not using compression therapy. She currently denies systemic signs of infection. 11/20; patient presents for follow-up. She has been using Medihoney and Hydrofera Blue to the wound bed. She reports improvement in pain however still has tenderness to the wound sites. She states that she has been elevating her leg and using an Ace bandage. She has been out of work for the past week. She had a PCR culture done at last clinic visit that grew coag negative staph and Candida parapsilosis. Keystone antibiotic ointment has been ordered. [name] Evans, [name] Evans ([id number]) [id number] Page 4 of 6 11/27; patient presents for follow-up. She received the [facility] antibiotic ointment and started this 4 days ago. She states that the wound site is still very tender. She has been using an Ace wrap for compression as she cannot tolerate anything tighter. 12/4; patient presents for follow-up. She has been using Keystone antibiotic with Hydrofera Blue under Ace wrap. She states the chronic pain has improved. She brought in short-term disability papers to be filled out. 12/11; patient presents for follow-up. We have been using Keystone antibiotic ointment and Hydrofera Blue under Kerlix/Coban. This is the first time with a true compression wrap. She tolerated this well. She has no issues or complaints today. She was contacted by vein and vascular today for her venous reflux studies. She states she is going to call them back to schedule these. 12/18; patient presents for follow-up. We have been using Keystone antibiotic and Hydrofera Blue under 3 layer compression. She tolerated the wrap well. She has no issues or complaints today. She states she has scheduled her venous reflux studies. 12/26; patient presents for follow-up. We have been using Keystone antibiotic with Hydrofera Blue  under 4-layer compression. She tolerated the increase in compression well. She has no issues or complaints today. 1/2; patient presents for follow-up. We have been using Keystone antibiotic with collagen under 4-layer compression. She has tolerated this well. She would like to return back to work. 1/8; patient presents for  follow-up. We have been using Keystone antibiotic ointment with collagen under 4-layer compression. She has no issues or complaints today. She does not have the [facility] antibiotic ointment with her today. She has returned to work and reports no issues with this. 1/15; patient presents for follow-up left lower leg wound using Keystone and collagen under 4-layer compression. Patient with significant chronic venous insufficiency. She also tells me she has a history of DVT during a pregnancy several years ago at which time she had an IVC filter which has since been removed. Objective Constitutional Patient is hypertensive.. Pulse regular and within target range for patient.Marland Kitchen Respirations regular, non-labored and within target range.. Temperature is normal and within the target range for the patient.Marland Kitchen Appears in no distress. Vitals Time Taken: 10:23 AM, Height: 65 in, Weight: 115 lbs, BMI: 19.1, Temperature: 99 F, Pulse: 70 bpm, Respiratory Rate: 18 breaths/min, Blood Pressure: 154/89 mmHg. General Notes: Wound exam; left lower extremity. Her edema control is very good. Hemosiderin deposition on the medial left lower leg. Wound surface looks excellent. Nice healthy granulation. No evidence of infection Integumentary (Hair, Skin) Wound #1 status is Open. Original cause of wound was Laceration. The date acquired was: 04/16/2022. The wound has been in treatment 9 weeks. The wound is located on the Left,Medial Lower Leg. The wound measures 2.8cm length x 0.8cm width x 0.1cm depth; 1.759cm^2 area and 0.176cm^3 volume. There is Fat Layer (Subcutaneous Tissue) exposed. There is no  tunneling or undermining noted. There is a medium amount of serosanguineous drainage noted. The wound margin is distinct with the outline attached to the wound base. There is large (67-100%) red granulation within the wound bed. There is no necrotic tissue within the wound bed. The periwound skin appearance exhibited: Scarring, Ecchymosis, Hemosiderin Staining. The periwound skin appearance did not exhibit: Callus, Crepitus, Excoriation, Induration, Rash, Dry/Scaly, Maceration, Atrophie Blanche, Cyanosis, Mottled, Pallor, Rubor, Erythema. Assessment Active Problems ICD-10 Non-pressure chronic ulcer of other part of left lower leg with fat layer exposed Chronic venous hypertension (idiopathic) with ulcer of left lower extremity Procedures Wound #1 Pre-procedure diagnosis of Wound #1 is a Venous Leg Ulcer located on the Left,Medial Lower Leg . There was a Four Layer Compression Therapy Procedure by Rhae Hammock, RN. Post procedure Diagnosis Wound #1: Same as Pre-Procedure Plan [name] Evans, [name] Evans ([id number]) [id number] Page 5 of 6 Follow-up Appointments: Return Appointment in 1 week. - w/ Dr. Heber [place] Monday 12/07/22 @ 1015 (already has appt.) Return Appointment in 2 weeks. - Dr. Heber [place] (go ahead and schedule) Other: - Continue to bring in topical antibiotics at each appt time. ***May return to work full time with no restrictions.**** Anesthetic: (In clinic) Topical Lidocaine 5% applied to wound bed Bathing/ Shower/ Hygiene: May shower with protection but do not get wound dressing(s) wet. Protect dressing(s) with water repellant cover (for example, large plastic bag) or a cast cover and may then take shower. Edema Control - Lymphedema / SCD / Other: Avoid standing for long periods of time. WOUND #1: - Lower Leg Wound Laterality: Left, Medial Cleanser: Soap and Water 1 x Per Week/30 Days Discharge Instructions: May shower and wash wound with dial  antibacterial soap and water prior to dressing change. Cleanser: Wound Cleanser (Generic) 1 x Per Week/30 Days Discharge Instructions: Cleanse the wound with wound cleanser prior to applying a clean dressing using gauze sponges, not tissue or cotton balls. Peri-Wound Care: Triamcinolone 15 (g) 1 x Per Week/30 Days Discharge Instructions: Use triamcinolone 15 (g) as directed Peri-Wound Care: Sween  Lotion (Moisturizing lotion) 1 x Per Week/30 Days Discharge Instructions: Apply moisturizing lotion as directed Prim Dressing: Promogran Prisma Matrix, 4.34 (sq in) (silver collagen) 1 x Per Week/30 Days ary Discharge Instructions: Moisten collagen with saline or hydrogel Secondary Dressing: ABD Pad, 5x9 (Generic) 1 x Per Week/30 Days Discharge Instructions: Apply over primary dressing as directed. Secondary Dressing: Woven Gauze Sponge, Non-Sterile 4x4 in (Generic) 1 x Per Week/30 Days Discharge Instructions: Apply over primary dressing as directed. Com pression Wrap: FourPress (4 layer compression wrap) 1 x Per Week/30 Days Discharge Instructions: Apply four layer compression. 1. Patient's wound is made nice progress 2. I do not think she needs the [facility] antibiotic at this point we can continue with the silver collagen under 4-layer compression 3. I talked to the patient about her need for 20/30 mm below-knee stockings which I think she is already ordered. 4. Awaiting her venous reflux studies Electronic Signature(s) Signed: 11/30/2022 3:35:21 PM By: Linton Ham MD Entered By: Linton Ham on 11/30/2022 11:01:31 -------------------------------------------------------------------------------- SuperBill Details Patient Name: Date of Service: [name] Evans, [name] Evans 11/30/2022 Medical Record Number: [id number] Patient Account Number: [id number] Date of Birth/Sex: Treating RN: 1976/07/02 (47 y.o. Tonita Phoenix, Lauren Primary Care Provider: PA Haig Prophet, NO Other Clinician: Referring  Provider: Treating Provider/Extender: [name] in Treatment: 9 Diagnosis Coding ICD-10 Codes Code Description [id number] Non-pressure chronic ulcer of other part of left lower leg with fat layer exposed I87.312 Chronic venous hypertension (idiopathic) with ulcer of left lower extremity Facility Procedures : CPT4 Code: [id number] Description: (Facility Use Only) [place] [facility] Modifier: Quantity: 1 Physician Procedures Electronic Signature(s) Signed: 11/30/2022 3:35:21 PM By: Linton Ham MD Entered By: Linton Ham on 11/30/2022 11:01:49

## 2023-01-07 ENCOUNTER — Inpatient Hospital Stay
Admission: EM | Admit: 2023-01-07 | Discharge: 2023-01-09 | DRG: 661 | Disposition: A | Discharge status: Home / Self Care | Payer: BC Managed Care – PPO | Attending: Internal Medicine | Admitting: Internal Medicine

## 2023-01-07 ENCOUNTER — Encounter: Payer: Self-pay | Admitting: Emergency Medicine

## 2023-01-07 ENCOUNTER — Emergency Department
Admission: EM | Admit: 2023-01-07 | Discharge: 2023-01-07 | Disposition: A | Discharge status: Home / Self Care | Payer: BC Managed Care – PPO | Source: Home / Self Care | Attending: Emergency Medicine | Admitting: Emergency Medicine

## 2023-01-07 ENCOUNTER — Other Ambulatory Visit: Payer: Self-pay

## 2023-01-07 ENCOUNTER — Ambulatory Visit: Payer: Self-pay | Admitting: *Deleted

## 2023-01-07 ENCOUNTER — Emergency Department: Payer: BC Managed Care – PPO

## 2023-01-07 DIAGNOSIS — N200 Calculus of kidney: Secondary | ICD-10-CM

## 2023-01-07 DIAGNOSIS — N23 Unspecified renal colic: Secondary | ICD-10-CM

## 2023-01-07 DIAGNOSIS — K769 Liver disease, unspecified: Secondary | ICD-10-CM | POA: Insufficient documentation

## 2023-01-07 DIAGNOSIS — Z9851 Tubal ligation status: Secondary | ICD-10-CM

## 2023-01-07 DIAGNOSIS — Z86718 Personal history of other venous thrombosis and embolism: Secondary | ICD-10-CM

## 2023-01-07 DIAGNOSIS — Z885 Allergy status to narcotic agent status: Secondary | ICD-10-CM

## 2023-01-07 DIAGNOSIS — F17200 Nicotine dependence, unspecified, uncomplicated: Secondary | ICD-10-CM

## 2023-01-07 DIAGNOSIS — G43909 Migraine, unspecified, not intractable, without status migrainosus: Secondary | ICD-10-CM | POA: Diagnosis present

## 2023-01-07 DIAGNOSIS — F17290 Nicotine dependence, other tobacco product, uncomplicated: Secondary | ICD-10-CM | POA: Diagnosis present

## 2023-01-07 DIAGNOSIS — M419 Scoliosis, unspecified: Secondary | ICD-10-CM | POA: Diagnosis present

## 2023-01-07 DIAGNOSIS — Z888 Allergy status to other drugs, medicaments and biological substances status: Secondary | ICD-10-CM

## 2023-01-07 DIAGNOSIS — N132 Hydronephrosis with renal and ureteral calculous obstruction: Principal | ICD-10-CM | POA: Diagnosis present

## 2023-01-07 DIAGNOSIS — D649 Anemia, unspecified: Secondary | ICD-10-CM | POA: Diagnosis present

## 2023-01-07 DIAGNOSIS — Z87440 Personal history of urinary (tract) infections: Secondary | ICD-10-CM

## 2023-01-07 DIAGNOSIS — R1031 Right lower quadrant pain: Secondary | ICD-10-CM | POA: Diagnosis not present

## 2023-01-07 DIAGNOSIS — R8271 Bacteriuria: Secondary | ICD-10-CM

## 2023-01-07 DIAGNOSIS — Z79899 Other long term (current) drug therapy: Secondary | ICD-10-CM

## 2023-01-07 DIAGNOSIS — N201 Calculus of ureter: Secondary | ICD-10-CM

## 2023-01-07 DIAGNOSIS — E876 Hypokalemia: Secondary | ICD-10-CM | POA: Diagnosis present

## 2023-01-07 LAB — URINALYSIS, ROUTINE W REFLEX MICROSCOPIC
Bilirubin Urine: NEGATIVE
Glucose, UA: NEGATIVE mg/dL
Ketones, ur: NEGATIVE mg/dL
Leukocytes,Ua: NEGATIVE
Nitrite: NEGATIVE
Protein, ur: NEGATIVE mg/dL
Specific Gravity, Urine: 1.014 (ref 1.005–1.030)
pH: 6 (ref 5.0–8.0)

## 2023-01-07 LAB — CBC WITH DIFFERENTIAL/PLATELET
Abs Immature Granulocytes: 0.01 10*3/uL (ref 0.00–0.07)
Basophils Absolute: 0 10*3/uL (ref 0.0–0.1)
Basophils Relative: 0 %
Eosinophils Absolute: 0.1 10*3/uL (ref 0.0–0.5)
Eosinophils Relative: 3 %
HCT: 35.1 % — ABNORMAL LOW (ref 36.0–46.0)
Hemoglobin: 11.1 g/dL — ABNORMAL LOW (ref 12.0–15.0)
Immature Granulocytes: 0 %
Lymphocytes Relative: 24 %
Lymphs Abs: 1 10*3/uL (ref 0.7–4.0)
MCH: 27.6 pg (ref 26.0–34.0)
MCHC: 31.6 g/dL (ref 30.0–36.0)
MCV: 87.3 fL (ref 80.0–100.0)
Monocytes Absolute: 0.4 10*3/uL (ref 0.1–1.0)
Monocytes Relative: 9 %
Neutro Abs: 2.6 10*3/uL (ref 1.7–7.7)
Neutrophils Relative %: 64 %
Platelets: 157 10*3/uL (ref 150–400)
RBC: 4.02 MIL/uL (ref 3.87–5.11)
RDW: 13.2 % (ref 11.5–15.5)
WBC: 4.1 10*3/uL (ref 4.0–10.5)
nRBC: 0 % (ref 0.0–0.2)

## 2023-01-07 LAB — COMPREHENSIVE METABOLIC PANEL
ALT: 11 U/L (ref 0–44)
AST: 20 U/L (ref 15–41)
Albumin: 3.8 g/dL (ref 3.5–5.0)
Alkaline Phosphatase: 55 U/L (ref 38–126)
Anion gap: 5 (ref 5–15)
BUN: 12 mg/dL (ref 6–20)
CO2: 22 mmol/L (ref 22–32)
Calcium: 8.5 mg/dL — ABNORMAL LOW (ref 8.9–10.3)
Chloride: 108 mmol/L (ref 98–111)
Creatinine, Ser: 0.76 mg/dL (ref 0.44–1.00)
GFR, Estimated: >60 mL/min (ref >60)
Glucose, Bld: 107 mg/dL — ABNORMAL HIGH (ref 70–99)
Potassium: 3.6 mmol/L (ref 3.5–5.1)
Sodium: 135 mmol/L (ref 135–145)
Total Bilirubin: 0.4 mg/dL (ref 0.3–1.2)
Total Protein: 7.2 g/dL (ref 6.5–8.1)

## 2023-01-07 LAB — LIPASE, BLOOD: Lipase: 33 U/L (ref 11–51)

## 2023-01-07 LAB — POC URINE PREG, ED: Preg Test, Ur: NEGATIVE

## 2023-01-07 MED ORDER — KETOROLAC TROMETHAMINE 30 MG/ML IJ SOLN
30.0000 mg | Freq: Once | INTRAMUSCULAR | Status: AC
  Administered 2023-01-07: 30 mg via INTRAVENOUS
  Filled 2023-01-07: qty 1

## 2023-01-07 MED ORDER — KETOROLAC TROMETHAMINE 30 MG/ML IJ SOLN
15.0000 mg | Freq: Once | INTRAMUSCULAR | Status: AC
  Administered 2023-01-07: 15 mg via INTRAVENOUS
  Filled 2023-01-07: qty 1

## 2023-01-07 MED ORDER — FENTANYL CITRATE PF 50 MCG/ML IJ SOSY
50.0000 ug | PREFILLED_SYRINGE | Freq: Once | INTRAMUSCULAR | Status: AC
  Administered 2023-01-07: 50 ug via INTRAVENOUS
  Filled 2023-01-07: qty 1

## 2023-01-07 MED ORDER — OXYCODONE HCL 5 MG PO TABS: 5.0000 mg | ORAL_TABLET | Freq: Four times a day (QID) | ORAL | 0 refills | Status: DC | PRN

## 2023-01-07 MED ORDER — SODIUM CHLORIDE 0.9 % IV BOLUS
1000.0000 mL | Freq: Once | INTRAVENOUS | Status: AC
  Administered 2023-01-07: 1000 mL via INTRAVENOUS

## 2023-01-07 MED ORDER — DIPHENHYDRAMINE HCL 50 MG/ML IJ SOLN
50.0000 mg | Freq: Once | INTRAMUSCULAR | Status: AC
  Administered 2023-01-07: 50 mg via INTRAVENOUS
  Filled 2023-01-07: qty 1

## 2023-01-07 MED ORDER — NICOTINE POLACRILEX 4 MG MT LOZG: 4.0000 mg | LOZENGE | OROMUCOSAL | 0 refills | Status: DC | PRN

## 2023-01-07 MED ORDER — MORPHINE SULFATE (PF) 4 MG/ML IV SOLN
4.0000 mg | Freq: Once | INTRAVENOUS | Status: AC
  Administered 2023-01-07: 4 mg via INTRAVENOUS
  Filled 2023-01-07: qty 1

## 2023-01-07 MED ORDER — ONDANSETRON HCL 4 MG/2ML IJ SOLN
4.0000 mg | Freq: Once | INTRAMUSCULAR | Status: AC
  Administered 2023-01-07: 4 mg via INTRAVENOUS
  Filled 2023-01-07: qty 2

## 2023-01-07 MED ORDER — DEXTROSE-NACL 5-0.9 % IV SOLN
INTRAVENOUS | Status: DC
  Administered 2023-01-07: 1000 mL via INTRAVENOUS

## 2023-01-07 MED ORDER — TAMSULOSIN HCL 0.4 MG PO CAPS: 0.4000 mg | ORAL_CAPSULE | Freq: Every day | ORAL | 0 refills | Status: AC

## 2023-01-07 MED ORDER — ENOXAPARIN SODIUM 40 MG/0.4ML IJ SOSY
40.0000 mg | PREFILLED_SYRINGE | INTRAMUSCULAR | Status: DC
  Administered 2023-01-07 – 2023-01-08 (×2): 40 mg via SUBCUTANEOUS
  Filled 2023-01-07 (×2): qty 0.4

## 2023-01-07 MED ORDER — FAMOTIDINE IN NACL 20-0.9 MG/50ML-% IV SOLN
20.0000 mg | Freq: Once | INTRAVENOUS | Status: AC
  Administered 2023-01-07: 20 mg via INTRAVENOUS
  Filled 2023-01-07: qty 50

## 2023-01-07 MED ORDER — IOHEXOL 300 MG/ML  SOLN
100.0000 mL | Freq: Once | INTRAMUSCULAR | Status: AC | PRN
  Administered 2023-01-07: 100 mL via INTRAVENOUS

## 2023-01-07 MED ORDER — HYDROMORPHONE HCL 1 MG/ML IJ SOLN
1.0000 mg | INTRAMUSCULAR | Status: DC | PRN
  Administered 2023-01-07 – 2023-01-09 (×16): 1 mg via INTRAVENOUS
  Filled 2023-01-07 (×15): qty 1

## 2023-01-07 MED ORDER — PROMETHAZINE HCL 25 MG PO TABS: 12.5000 mg | ORAL_TABLET | Freq: Four times a day (QID) | ORAL | Status: DC | PRN

## 2023-01-07 MED ORDER — MORPHINE SULFATE (PF) 2 MG/ML IV SOLN
2.0000 mg | Freq: Once | INTRAVENOUS | Status: AC
  Administered 2023-01-07: 2 mg via INTRAVENOUS
  Filled 2023-01-07: qty 1

## 2023-01-07 MED ORDER — NICOTINE 14 MG/24HR TD PT24: 14.0000 mg | MEDICATED_PATCH | TRANSDERMAL | 3 refills | Status: AC

## 2023-01-07 MED ORDER — CEPHALEXIN 500 MG PO CAPS: 500.0000 mg | ORAL_CAPSULE | Freq: Four times a day (QID) | ORAL | 0 refills | Status: DC

## 2023-01-07 MED ORDER — TAMSULOSIN HCL 0.4 MG PO CAPS
0.4000 mg | ORAL_CAPSULE | Freq: Every day | ORAL | Status: DC
  Administered 2023-01-07 – 2023-01-09 (×3): 0.4 mg via ORAL
  Filled 2023-01-07 (×3): qty 1

## 2023-01-07 NOTE — ED Triage Notes (Addendum)
Pt was here last night for kidney stone pain and worked up with CT; was discharged when pain under control but now it is not. Pain in umbilical groin area on R side. Pt took medicine flomax and ibuprofen and tylenol and oxycodone at home.

## 2023-01-07 NOTE — ED Provider Notes (Addendum)
[facility] Provider Note    Event Date/Time   First MD Initiated Contact with Patient 01/07/23 [phone]     (approximate)   History   Abdominal Pain   HPI  [name] Evans is a 47 y.o. female   Past medical history of DVT, chronic left lower extremity wound, frequent UTIs, who presents emergency department today with right lower quadrant pain which is severe acute onset while sleeping.  She had a normal day yesterday with no acute medical complaints no recent illnesses.  She has had no dysuria or frequency, though in the emergency department she had some urethral pain when standing.  No trauma to the area.  She is not sexually active and has no new or changed vaginal discharge.  She denies GI bleeding or diarrhea.  She is nauseous but no vomiting.  No fevers or chills.   External Medical Documents Reviewed: Emergency department visit dated 10/24/2022 for pain and burning in the pelvis found to have yeast, and likely vaginal atrophy due to menopause      Physical Exam   Triage Vital Signs: ED Triage Vitals [01/07/23 0337]  Enc Vitals Group     BP      Pulse      Resp      Temp      Temp src      SpO2      Weight 135 lb (61.2 kg)     Height 5' 5"$  (1.651 m)     Head Circumference      Peak Flow      Pain Score 10     Pain Loc      Pain Edu?      Excl. in [place]?     Most recent vital signs: Vitals:   01/07/23 0347 01/07/23 0637  BP: (!) 165/78 (!) 166/86  Pulse: 62 (!) 55  Resp: (!) 24 20  Temp: 99.1 F (37.3 C)   SpO2: 100% 97%    General: Awake CV:  Good peripheral perfusion.  Resp:  Normal effort.  Abd:  No distention.  Other:  Awake alert, appears to be in pain, abdomen is soft but there is tenderness to the abdomen upper and lower right sided.   ED Results / Procedures / Treatments   Labs (all labs ordered are listed, but only abnormal results are displayed) Labs Reviewed  CBC WITH DIFFERENTIAL/PLATELET - Abnormal; Notable for  the following components:      Result Value   Hemoglobin 11.1 (*)    HCT 35.1 (*)    All other components within normal limits  COMPREHENSIVE METABOLIC PANEL - Abnormal; Notable for the following components:   Glucose, Bld 107 (*)    Calcium 8.5 (*)    All other components within normal limits  URINALYSIS, ROUTINE W REFLEX MICROSCOPIC - Abnormal; Notable for the following components:   Color, Urine YELLOW (*)    APPearance HAZY (*)    Hgb urine dipstick LARGE (*)    Bacteria, UA RARE (*)    All other components within normal limits  LIPASE, BLOOD  POC URINE PREG, ED     I ordered and reviewed the above labs they are notable for urinalysis with rare bacteria, no leukocytes or white blood cells, normal creatinine  RADIOLOGY I independently reviewed and interpreted CT of the abdomen pelvis see no obvious obstructive or infectious processes   PROCEDURES:  Critical Care performed: No  Procedures   MEDICATIONS ORDERED IN ED: Medications  morphine (PF) 4 MG/ML injection 4 mg (4 mg Intravenous Given 01/07/23 0405)  ketorolac (TORADOL) 30 MG/ML injection 15 mg (15 mg Intravenous Given 01/07/23 0403)  ondansetron (ZOFRAN) injection 4 mg (4 mg Intravenous Given 01/07/23 0402)  iohexol (OMNIPAQUE) 300 MG/ML solution 100 mL (100 mLs Intravenous Contrast Given 01/07/23 0540)     IMPRESSION / MDM / ASSESSMENT AND PLAN / ED COURSE  I reviewed the triage vital signs and the nursing notes.                                Patient's presentation is most consistent with acute presentation with potential threat to life or bodily function.  Differential diagnosis includes, but is not limited to, appendicitis, renal colic/nephrolithiasis, urinary tract infection/pyelonephritis, cholecystitis or biliary pathologies, pancreatitis, aaa   The patient is on the cardiac monitor to evaluate for evidence of arrhythmia and/or significant heart rate changes.  MDM: This is a patient with severe  abdominal pain right lower/upper quadrant tenderness to palpation will check a CT scan with IV contrast to assess for surgical abdominal pathologies.  Check urine for urinary tract infection.  Treat pain and nausea with IV morphine, IV Toradol, IV Zofran.  Less likely pregnancy related because she is status post tubal ligation.  CT scan shows a 3 mm kidney stone.  UA w rare bacteria and no leukocytes and without dysuria frequency, with the kidney stone I will give a prescription for antibiotics in case of early urinary tract infection and send culture  -- I spent 5 minutes counseling this patient on smoking/vaping cessation.  We spoke about the patient's current use, impact of smoking, assessed willingness to quit, methods for cessation including medical management and nicotine replacement therapy (which I prescribed to the patient) and advised follow-up with primary doctor to continue to address smoking/vaping cessation. --  I let the patient know about the incidental liver abnormality found on CT scan and asked her to follow-up with her primary doctor for further evaluation/rpt imaging  I considered hospitalization for admission or observation but since the pain is well-controlled and she has a 3 mm stone and otherwise normal labs, I think outpatient follow-up with urology and pain medications and Flomax prescription most appropriate at this time.        FINAL CLINICAL IMPRESSION(S) / ED DIAGNOSES   Final diagnoses:  Kidney stone  Liver lesion  Nicotine dependence due to vaping non-tobacco product  Bacteriuria     Rx / DC Orders   ED Discharge Orders          Ordered    nicotine (NICODERM CQ - DOSED IN MG/24 HOURS) 14 mg/24hr patch  Every 24 hours        01/07/23 0621    nicotine polacrilex (NICOTINE MINI) 4 MG lozenge  As needed        01/07/23 0621    oxyCODONE (ROXICODONE) 5 MG immediate release tablet  Every 6 hours PRN        01/07/23 0621    tamsulosin (FLOMAX) 0.4 MG  CAPS capsule  Daily        01/07/23 0621    cephALEXin (KEFLEX) 500 MG capsule  4 times daily        01/07/23 [id number]             Note:  This document was prepared using Dragon voice recognition software and may include unintentional dictation errors.  Lucillie Garfinkel, MD 01/07/23 [id number]    Lucillie Garfinkel, MD 01/07/23 Victoria Vera, [place], MD 01/07/23 [place], [place], MD 01/07/23 [place], [place], MD 01/07/23 [phone]

## 2023-01-07 NOTE — Telephone Encounter (Signed)
  Chief Complaint: low abdominal pain per patient's significant other [place] Symptoms: severe pain low abdomen. Not relieved after medications prescribed oxycodone 5 mg flomax keflex Frequency: today after leaving ED Pertinent Negatives: Patient denies na  Disposition: [x]$ ED /[]$ Urgent Care (no appt availability in office) / []$ Appointment(In office/virtual)/ []$  [facility]/ []$ Home Care/ []$ Refused Recommended Disposition /[]$ [facility]/ []$  Follow-up with PCP Additional Notes:   Recommended to contact urology / go back to ED.     Reason for Disposition  [1] SEVERE pain (e.g., excruciating, scale 8-10) AND [2] not improved after pain medicine  Answer Assessment - Initial Assessment Questions 1. MAIN CONCERN OR SYMPTOM:  "What is your main concern right now?" "What question do you have?" "What's the main symptom you're worried about?" (e.g., blood in urine, flank pain)     Patient's significant other called to report patient in severe pain low abdomen  2. ONSET: "When did the  pain   start?"     After getting home from ED today  3. BETTER-SAME-WORSE: "Are you getting better, staying the same, or getting worse compared to how you felt at your last visit to the doctor (most recent medical visit)?"     Worse  4. VISIT DATE: "When were you seen?" (Date)     01/07/23 5. VISIT DOCTOR: "What is the name of the doctor (or NP/PA) taking care of you now?"     No PCP 6. VISIT DIAGNOSIS:  "What was the main symptom or problem that you were seen for?" "Were you given a diagnosis?"      Abdominal pain dx 93m kidney stone 7. TREATMENT: "Did you have any treatment for your kidney stone?" (e.g., none, doctor exam, lithotripsy, medicines, stent) If Yes, ask: "When did you have this treatment?"     Medication  8. NEXT APPOINTMENT: "Do you have a follow-up appointment with your doctor?"     na 9. PAIN: "Is there any pain?" If Yes, ask: "How bad is it?"  (Scale 0-10; or mild, moderate,  severe)    - NONE (0): no pain    - MILD (1-3): doesn't interfere with normal activities     - MODERATE (4-7): interferes with normal activities or awakens from sleep     - SEVERE (8-10): excruciating pain, unable to do any normal activities     severe 10. FEVER: "Do you have a fever?" If Yes, ask: "What is it, how was it measured, and when did it start?"       na 11. OTHER SYMPTOMS: "Do you have any other symptoms?" (e.g., abdomen pain, blood in urine, vomiting)        Severe pain low abdomen 12. PREGNANCY: "Is there any chance you are pregnant?" "When was your last menstrual period?"       na  Protocols used: Kidney Stone Follow-up Call-A-AH

## 2023-01-07 NOTE — ED Provider Notes (Signed)
[facility] Provider Note  Patient Contact: 3:56 PM (approximate)   History   Abdominal Pain   HPI  [name] Evans is a 47 y.o. female with a history of recent diagnosis of 3 mm kidney stone at right UVJ, presents to the emergency department with persistent pain.  Patient reports that she has had Tylenol, ibuprofen and oxycodone with little relief.  No chest pain, chest tightness or abdominal pain.  No fever or chills at home.      Physical Exam   Triage Vital Signs: ED Triage Vitals  Enc Vitals Group     BP 01/07/23 1510 (!) 140/81     Pulse Rate 01/07/23 1510 68     Resp 01/07/23 1510 20     Temp 01/07/23 1510 98.1 F (36.7 C)     Temp Source 01/07/23 1510 Oral     SpO2 01/07/23 1510 100 %     Weight --      Height --      Head Circumference --      Peak Flow --      Pain Score 01/07/23 1513 10     Pain Loc --      Pain Edu? --      Excl. in [place]? --     Most recent vital signs: Vitals:   01/07/23 1510  BP: (!) 140/81  Pulse: 68  Resp: 20  Temp: 98.1 F (36.7 C)  SpO2: 100%     General: Alert and in no acute distress. Eyes:  PERRL. EOMI. Head: No acute traumatic findings ENT:      Nose: No congestion/rhinnorhea.      Mouth/Throat: Mucous membranes are moist. Neck: No stridor. No cervical spine tenderness to palpation. Cardiovascular:  Good peripheral perfusion Respiratory: Normal respiratory effort without tachypnea or retractions. Lungs CTAB. Good air entry to the bases with no decreased or absent breath sounds. Gastrointestinal: Bowel sounds 4 quadrants. Soft and nontender to palpation. No guarding or rigidity. No palpable masses. No distention. No CVA tenderness. Musculoskeletal: Full range of motion to all extremities.  Neurologic:  No gross focal neurologic deficits are appreciated.  Skin:   No rash noted    ED Results / Procedures / Treatments   Labs (all labs ordered are listed, but only abnormal results are  displayed) Labs Reviewed - No data to display      PROCEDURES:  Critical Care performed: No  Procedures   MEDICATIONS ORDERED IN ED: Medications  fentaNYL (SUBLIMAZE) injection 50 mcg (50 mcg Intravenous Given 01/07/23 1617)  ondansetron (ZOFRAN) injection 4 mg (4 mg Intravenous Given 01/07/23 1617)  ketorolac (TORADOL) 30 MG/ML injection 30 mg (30 mg Intravenous Given 01/07/23 1617)  sodium chloride 0.9 % bolus 1,000 mL (1,000 mLs Intravenous New Bag/Given 01/07/23 1617)  diphenhydrAMINE (BENADRYL) injection 50 mg (50 mg Intravenous Given 01/07/23 1633)  famotidine (PEPCID) IVPB 20 mg premix ( Intravenous Restarted 01/07/23 1705)  morphine (PF) 2 MG/ML injection 2 mg (2 mg Intravenous Given 01/07/23 1633)     IMPRESSION / MDM / [place] / ED COURSE  I reviewed the triage vital signs and the nursing notes.                              Assessment and plan: Kidney stone pain:  47 year old female presents to the emergency department with persistent right-sided flank pain.  Patient was mildly hypertensive at triage but vital signs  were otherwise reassuring.  On exam, patient seemed well.  Will give IV fentanyl, Toradol and supplemental fluids and will reassess.   Patient developed an allergic reaction shortly after receiving a portion of her IV fentanyl and medication was discontinued.  She was given 2 mg of morphine and 30 mg of IV Toradol and stated that she was resting more comfortably but was concerned about going home as her pain returned this morning after discharge.  I discussed patient case with Dr. Jonelle Sidle who admitted patient for pain control.  I did notify urologist on-call, Dr. Jeb Levering of admission and he plans to see patient in the morning.  All patient questions were answered.   FINAL CLINICAL IMPRESSION(S) / ED DIAGNOSES   Final diagnoses:  Nephrolithiasis     Rx / DC Orders   ED Discharge Orders     None        Note:  This document was prepared  using Dragon voice recognition software and may include unintentional dictation errors.   Vallarie Mare [place], [place] 01/07/23 1851    Rada Hay, MD 01/07/23 Kathyrn Drown

## 2023-01-07 NOTE — H&P (Signed)
History and Physical    Patient: [name] Evans [id number] DOB: 1976/10/27 DOA: 01/07/2023 DOS: the patient was seen and examined on 01/07/2023 PCP: Pcp, No  Patient coming from: Home  Chief Complaint:  Chief Complaint  Patient presents with   Abdominal Pain   HPI: [name] Evans is a 47 y.o. female with medical history significant of chronic UTIs, left lower extremity wound, migraine headaches and prior DVT who presents ER with severe left flank pain.  Patient was diagnosed with 3 mm nephrolithiasis.  Patient was having significant pain 10 out of 10.  Pain started yesterday and was suddenly.  She was started on Flomax yesterday with hydration.  She was unable to feel comfortable staying still.  Patient therefore is being admitted for pain management.  She has some nausea and episode of vomiting.  No fever or chills.  Urinalysis showed no evidence of UTI.  Review of Systems: As mentioned in the history of present illness. All other systems reviewed and are negative. Past Medical History:  Diagnosis Date   DVT (deep venous thrombosis) (HCC)    Frequent PVCs    Frequent UTI    Migraines    Scoliosis    Past Surgical History:  Procedure Laterality Date   CERVICAL CONE BIOPSY     IVC filter, removed     TUBAL LIGATION     TYMPANOSTOMY TUBE PLACEMENT     Uterine ablation     Social History:  reports that she has been smoking e-cigarettes. She has never used smokeless tobacco. She reports that she does not drink alcohol. No history on file for drug use.  Allergies  Allergen Reactions   Fentanyl Hives    Unknown if pt is allergic to zofran of fentanyl- had burning in IV with both and rash developed after   Zofran [Ondansetron] Hives    Unknown if pt is allergic to zofran of fentanyl- had burning in IV with both and rash developed after    History reviewed. No pertinent family history.  Prior to Admission medications   Medication Sig Start Date End Date Taking? Authorizing  Provider  bacitracin ointment Apply 1 Application topically 2 (two) times daily. 09/05/22   Varney Biles, MD  cephALEXin (KEFLEX) 500 MG capsule Take 1 capsule (500 mg total) by mouth 4 (four) times daily for 7 days. 01/07/23 01/14/23  Lucillie Garfinkel, MD  doxycycline (VIBRAMYCIN) 100 MG capsule Take 1 capsule (100 mg total) by mouth 2 (two) times daily. 09/05/22   Varney Biles, MD  fluconazole (DIFLUCAN) 150 MG tablet Take one now and one in a week 10/24/22   Caryn Section, Linden Dolin, PA-C  nicotine (NICODERM CQ - DOSED IN MG/24 HOURS) 14 mg/24hr patch Place 1 patch (14 mg total) onto the skin daily. 01/07/23 01/07/24  Lucillie Garfinkel, MD  nicotine polacrilex (NICOTINE MINI) 4 MG lozenge Take 1 lozenge (4 mg total) by mouth as needed for smoking cessation. 01/07/23   Lucillie Garfinkel, MD  oxyCODONE (ROXICODONE) 5 MG immediate release tablet Take 1 tablet (5 mg total) by mouth every 6 (six) hours as needed for up to 12 doses for severe pain or breakthrough pain. 01/07/23   Lucillie Garfinkel, MD  tamsulosin (FLOMAX) 0.4 MG CAPS capsule Take 1 capsule (0.4 mg total) by mouth daily. 01/07/23 02/06/23  Lucillie Garfinkel, MD    Physical Exam: Vitals:   01/07/23 1510  BP: (!) 140/81  Pulse: 68  Resp: 20  Temp: 98.1 F (36.7 C)  TempSrc: Oral  SpO2: 100%  Constitutional: Acutely ill looking, in mild distress due to pain Eyes: PERRL, lids and conjunctivae normal ENMT: Mucous membranes are moist. Posterior pharynx clear of any exudate or lesions.Normal dentition.  Neck: normal, supple, no masses, no thyromegaly Respiratory: clear to auscultation bilaterally, no wheezing, no crackles. Normal respiratory effort. No accessory muscle use.  Cardiovascular: Regular rate and rhythm, no murmurs / rubs / gallops. No extremity edema. 2+ pedal pulses. No carotid bruits.  Abdomen: no tenderness, no masses palpated. No hepatosplenomegaly. Bowel sounds positive.  Musculoskeletal: Good range of motion, no joint swelling or tenderness, Skin: no  rashes, lesions, ulcers. No induration Neurologic: CN 2-12 grossly intact. Sensation intact, DTR normal. Strength 5/5 in all 4.  Psychiatric: Normal judgment and insight. Alert and oriented x 3. Normal mood  Data Reviewed:  3 mm obstructing calculus at the right ureterovesical junction with moderate proximal right hydroureter and nephrosis.  Also 4 mm nonobstructing calculus in the lower pole collecting system of the right kidney.  Urinalysis essentially negative.  CBC and chemistry largely within normal.  Assessment and Plan:  #1 right-sided nephrolithiasis with hydronephrosis: Continue pain management.  IV fluids.  Urology consulted.  #2 history of migraine headaches: No headache at the moment.  Continue monitoring.  #3 recurrent UTI: No evidence of UTI at the moment.     Advance Care Planning:   Code Status: Not on file full code  Consults: Urology  Family Communication: No family at bedside  Severity of Illness: The appropriate patient status for this patient is INPATIENT. Inpatient status is judged to be reasonable and necessary in order to provide the required intensity of service to ensure the patient's safety. The patient's presenting symptoms, physical exam findings, and initial radiographic and laboratory data in the context of their chronic comorbidities is felt to place them at high risk for further clinical deterioration. Furthermore, it is not anticipated that the patient will be medically stable for discharge from the hospital within 2 midnights of admission.   * I certify that at the point of admission it is my clinical judgment that the patient will require inpatient hospital care spanning beyond 2 midnights from the point of admission due to high intensity of service, high risk for further deterioration and high frequency of surveillance required.*  AuthorBarbette Merino, MD 01/07/2023 7:03 PM  For on call review [email or web address].

## 2023-01-07 NOTE — ED Notes (Signed)
Request made for transport to the floor. 

## 2023-01-07 NOTE — ED Notes (Signed)
Pt stated that zofran was burning, iv checked for patency-flushed and returned blood, pt stated no burning with fluids. Pt then stated burning with fentanyl. Iv checked for patency again- flushed and blood return noted. Rn started to notice redness along the line of the vein. Redness then turned into hives. RN notified provider, Vallarie Mare. Benadryl and pepcid ordered. Only .25 mL of fentanyl administered. She stated to hold off on rest and morphine would be ordered.

## 2023-01-07 NOTE — ED Triage Notes (Signed)
Pt to triage via w/c, appears uncomfortable, grimacing; st awoke 2hrs PTA with rt lower abd pain, nonradiating accomp by nausea and diff urinating

## 2023-01-07 NOTE — Discharge Instructions (Addendum)
Take acetaminophen 650 mg and ibuprofen 400 mg every 6 hours for pain.  Take with food. Oxycodone as prescribed for breakthrough/severe pain. Take flomax.   There was an abnormal finding in your liver today on your CAT scan that I think is unrelated to your pain.  Follow-up with your primary doctor to review these findings and discuss further evaluation as needed.  Thank you for choosing [facility] for your health care today!  Please see your primary doctor this week for a follow up appointment.   Sometimes, in the early stages of certain disease courses it is difficult to detect in the emergency department evaluation -- so, it is important that you continue to monitor your symptoms and call your doctor right away or return to the emergency department if you develop any new or worsening symptoms.  Please go to the following website to schedule new (and existing) patient appointments:   [email or web address]  If you do not have a primary doctor try calling the following clinics to establish care:  If you have insurance:  [facility] [phone] [place] [address]   [facility]  [phone] [place] [place] [address]   If you do not have insurance:  [facility]  [phone] [address], [place] [address]   The following is another list of primary care offices in the area who are accepting new patients at this time.  Please reach out to one of them directly and let them know you would like to schedule an appointment to follow up on an Emergency Department visit, and/or to establish a new primary care provider (PCP).  There are likely other primary care clinics in the are who are accepting new patients, but this is an excellent place to start:  [facility] physician: Dr Lavon Paganini [address] [place], [place] [address] [phone]  [facility] Lead Physician: Dr Steele Sizer [address], [place], [place] [address] [phone]  [place] Physician: Dr Park Liter [address] [place], [place] [address] [phone]  [facility] Lead Physician: Dr Dewaine Oats [place], [place], [place] [address] [phone]  [facility] Physician: Dr Halina Maidens [address] [place] Broach [place], [place] [address] [phone]   It was my pleasure to care for you today.   Hoover Brunette Jacelyn Grip, MD

## 2023-01-08 ENCOUNTER — Other Ambulatory Visit: Payer: Self-pay | Admitting: Urology

## 2023-01-08 ENCOUNTER — Encounter: Payer: Self-pay | Admitting: Internal Medicine

## 2023-01-08 ENCOUNTER — Encounter
Admission: EM | Disposition: A | Discharge status: Home / Self Care | Payer: Self-pay | Source: Home / Self Care | Attending: Internal Medicine

## 2023-01-08 ENCOUNTER — Inpatient Hospital Stay: Payer: BC Managed Care – PPO

## 2023-01-08 ENCOUNTER — Inpatient Hospital Stay: Payer: BC Managed Care – PPO | Admitting: Certified Registered"

## 2023-01-08 DIAGNOSIS — Z9851 Tubal ligation status: Secondary | ICD-10-CM | POA: Diagnosis not present

## 2023-01-08 DIAGNOSIS — G43909 Migraine, unspecified, not intractable, without status migrainosus: Secondary | ICD-10-CM | POA: Diagnosis present

## 2023-01-08 DIAGNOSIS — Z885 Allergy status to narcotic agent status: Secondary | ICD-10-CM | POA: Diagnosis not present

## 2023-01-08 DIAGNOSIS — N23 Unspecified renal colic: Secondary | ICD-10-CM

## 2023-01-08 DIAGNOSIS — N201 Calculus of ureter: Secondary | ICD-10-CM

## 2023-01-08 DIAGNOSIS — F17290 Nicotine dependence, other tobacco product, uncomplicated: Secondary | ICD-10-CM | POA: Diagnosis present

## 2023-01-08 DIAGNOSIS — N202 Calculus of kidney with calculus of ureter: Secondary | ICD-10-CM | POA: Diagnosis not present

## 2023-01-08 DIAGNOSIS — R1031 Right lower quadrant pain: Secondary | ICD-10-CM | POA: Diagnosis present

## 2023-01-08 DIAGNOSIS — E876 Hypokalemia: Secondary | ICD-10-CM | POA: Diagnosis present

## 2023-01-08 DIAGNOSIS — Z888 Allergy status to other drugs, medicaments and biological substances status: Secondary | ICD-10-CM | POA: Diagnosis not present

## 2023-01-08 DIAGNOSIS — Z79899 Other long term (current) drug therapy: Secondary | ICD-10-CM | POA: Diagnosis not present

## 2023-01-08 DIAGNOSIS — K769 Liver disease, unspecified: Secondary | ICD-10-CM | POA: Diagnosis present

## 2023-01-08 DIAGNOSIS — D649 Anemia, unspecified: Secondary | ICD-10-CM | POA: Diagnosis present

## 2023-01-08 DIAGNOSIS — M419 Scoliosis, unspecified: Secondary | ICD-10-CM | POA: Diagnosis present

## 2023-01-08 DIAGNOSIS — N132 Hydronephrosis with renal and ureteral calculous obstruction: Secondary | ICD-10-CM | POA: Diagnosis present

## 2023-01-08 DIAGNOSIS — Z87440 Personal history of urinary (tract) infections: Secondary | ICD-10-CM | POA: Diagnosis not present

## 2023-01-08 DIAGNOSIS — N200 Calculus of kidney: Secondary | ICD-10-CM | POA: Diagnosis not present

## 2023-01-08 DIAGNOSIS — Z86718 Personal history of other venous thrombosis and embolism: Secondary | ICD-10-CM | POA: Diagnosis not present

## 2023-01-08 HISTORY — PX: CYSTOSCOPY WITH RETROGRADE PYELOGRAM, URETEROSCOPY AND STENT PLACEMENT: SHX5789

## 2023-01-08 LAB — COMPREHENSIVE METABOLIC PANEL
ALT: 9 U/L (ref 0–44)
AST: 19 U/L (ref 15–41)
Albumin: 3.4 g/dL — ABNORMAL LOW (ref 3.5–5.0)
Alkaline Phosphatase: 43 U/L (ref 38–126)
Anion gap: 5 (ref 5–15)
BUN: 10 mg/dL (ref 6–20)
CO2: 22 mmol/L (ref 22–32)
Calcium: 8.1 mg/dL — ABNORMAL LOW (ref 8.9–10.3)
Chloride: 109 mmol/L (ref 98–111)
Creatinine, Ser: 0.69 mg/dL (ref 0.44–1.00)
GFR, Estimated: >60 mL/min (ref >60)
Glucose, Bld: 101 mg/dL — ABNORMAL HIGH (ref 70–99)
Potassium: 3.4 mmol/L — ABNORMAL LOW (ref 3.5–5.1)
Sodium: 136 mmol/L (ref 135–145)
Total Bilirubin: 0.9 mg/dL (ref 0.3–1.2)
Total Protein: 6.3 g/dL — ABNORMAL LOW (ref 6.5–8.1)

## 2023-01-08 LAB — CBC
HCT: 31.5 % — ABNORMAL LOW (ref 36.0–46.0)
Hemoglobin: 10.2 g/dL — ABNORMAL LOW (ref 12.0–15.0)
MCH: 27.5 pg (ref 26.0–34.0)
MCHC: 32.4 g/dL (ref 30.0–36.0)
MCV: 84.9 fL (ref 80.0–100.0)
Platelets: 159 10*3/uL (ref 150–400)
RBC: 3.71 MIL/uL — ABNORMAL LOW (ref 3.87–5.11)
RDW: 13.1 % (ref 11.5–15.5)
WBC: 5 10*3/uL (ref 4.0–10.5)
nRBC: 0 % (ref 0.0–0.2)

## 2023-01-08 LAB — RETICULOCYTES
Immature Retic Fract: 7 % (ref 2.3–15.9)
RBC.: 3.71 MIL/uL — ABNORMAL LOW (ref 3.87–5.11)
Retic Count, Absolute: 36 10*3/uL (ref 19.0–186.0)
Retic Ct Pct: 1 % (ref 0.4–3.1)

## 2023-01-08 LAB — IRON AND TIBC
Iron: 68 ug/dL (ref 28–170)
Saturation Ratios: 28 % (ref 10.4–31.8)
TIBC: 246 ug/dL — ABNORMAL LOW (ref 250–450)
UIBC: 178 ug/dL

## 2023-01-08 LAB — URINE CULTURE: Culture: NO GROWTH

## 2023-01-08 LAB — FERRITIN: Ferritin: 23 ng/mL (ref 11–307)

## 2023-01-08 LAB — HIV ANTIBODY (ROUTINE TESTING W REFLEX): HIV Screen 4th Generation wRfx: NONREACTIVE

## 2023-01-08 LAB — FOLATE: Folate: 15.9 ng/mL (ref >5.9)

## 2023-01-08 SURGERY — CYSTOURETEROSCOPY, WITH RETROGRADE PYELOGRAM AND STENT INSERTION
Anesthesia: General

## 2023-01-08 MED ORDER — OXYBUTYNIN CHLORIDE 5 MG PO TABS
5.0000 mg | ORAL_TABLET | Freq: Three times a day (TID) | ORAL | Status: DC | PRN
  Administered 2023-01-09: 5 mg via ORAL
  Filled 2023-01-08 (×2): qty 1

## 2023-01-08 MED ORDER — ACETAMINOPHEN 10 MG/ML IV SOLN: 1000.0000 mg | Freq: Once | INTRAVENOUS | Status: DC | PRN

## 2023-01-08 MED ORDER — PROMETHAZINE HCL 25 MG/ML IJ SOLN: 6.2500 mg | INTRAMUSCULAR | Status: DC | PRN

## 2023-01-08 MED ORDER — FENTANYL CITRATE (PF) 100 MCG/2ML IJ SOLN
INTRAMUSCULAR | Status: AC
  Filled 2023-01-08: qty 2

## 2023-01-08 MED ORDER — IOHEXOL 180 MG/ML  SOLN
INTRAMUSCULAR | Status: DC | PRN
  Administered 2023-01-08: 20 mL
  Administered 2023-01-08 (×2): 10 mL

## 2023-01-08 MED ORDER — OXYCODONE HCL 5 MG/5ML PO SOLN: 5.0000 mg | Freq: Once | ORAL | Status: DC | PRN

## 2023-01-08 MED ORDER — OXYCODONE HCL 5 MG PO TABS: 5.0000 mg | ORAL_TABLET | Freq: Once | ORAL | Status: DC | PRN

## 2023-01-08 MED ORDER — SUGAMMADEX SODIUM 200 MG/2ML IV SOLN
INTRAVENOUS | Status: DC | PRN
  Administered 2023-01-08: 200 mg via INTRAVENOUS

## 2023-01-08 MED ORDER — PROPOFOL 500 MG/50ML IV EMUL
INTRAVENOUS | Status: DC | PRN
  Administered 2023-01-08: 30 ug/kg/min via INTRAVENOUS

## 2023-01-08 MED ORDER — MIDAZOLAM HCL 2 MG/2ML IJ SOLN
INTRAMUSCULAR | Status: DC | PRN
  Administered 2023-01-08: 2 mg via INTRAVENOUS

## 2023-01-08 MED ORDER — POTASSIUM CHLORIDE CRYS ER 20 MEQ PO TBCR
40.0000 meq | EXTENDED_RELEASE_TABLET | Freq: Once | ORAL | Status: AC
  Administered 2023-01-08: 40 meq via ORAL
  Filled 2023-01-08: qty 2

## 2023-01-08 MED ORDER — KETOROLAC TROMETHAMINE 30 MG/ML IJ SOLN
30.0000 mg | Freq: Once | INTRAMUSCULAR | Status: AC
  Administered 2023-01-08: 30 mg via INTRAVENOUS

## 2023-01-08 MED ORDER — PROPOFOL 10 MG/ML IV BOLUS
INTRAVENOUS | Status: DC | PRN
  Administered 2023-01-08: 150 mg via INTRAVENOUS

## 2023-01-08 MED ORDER — LACTATED RINGERS IV SOLN: INTRAVENOUS | Status: DC | PRN

## 2023-01-08 MED ORDER — DEXAMETHASONE SODIUM PHOSPHATE 10 MG/ML IJ SOLN
INTRAMUSCULAR | Status: DC | PRN
  Administered 2023-01-08: 10 mg via INTRAVENOUS

## 2023-01-08 MED ORDER — CEFAZOLIN SODIUM-DEXTROSE 2-3 GM-%(50ML) IV SOLR
INTRAVENOUS | Status: DC | PRN
  Administered 2023-01-08: 2 g via INTRAVENOUS

## 2023-01-08 MED ORDER — KETOROLAC TROMETHAMINE 30 MG/ML IJ SOLN
INTRAMUSCULAR | Status: AC
  Filled 2023-01-08: qty 1

## 2023-01-08 MED ORDER — NICOTINE 14 MG/24HR TD PT24
14.0000 mg | MEDICATED_PATCH | TRANSDERMAL | Status: DC
  Administered 2023-01-08 – 2023-01-09 (×2): 14 mg via TRANSDERMAL
  Filled 2023-01-08 (×2): qty 1

## 2023-01-08 MED ORDER — ROCURONIUM BROMIDE 100 MG/10ML IV SOLN
INTRAVENOUS | Status: DC | PRN
  Administered 2023-01-08: 40 mg via INTRAVENOUS

## 2023-01-08 MED ORDER — HYDRALAZINE HCL 20 MG/ML IJ SOLN: 5.0000 mg | Freq: Four times a day (QID) | INTRAMUSCULAR | Status: DC | PRN

## 2023-01-08 MED ORDER — SODIUM CHLORIDE 0.9 % IR SOLN
Status: DC | PRN
  Administered 2023-01-08: 3000 mL

## 2023-01-08 MED ORDER — MIDAZOLAM HCL 2 MG/2ML IJ SOLN
INTRAMUSCULAR | Status: AC
  Filled 2023-01-08: qty 2

## 2023-01-08 MED ORDER — DROPERIDOL 2.5 MG/ML IJ SOLN: 0.6250 mg | Freq: Once | INTRAMUSCULAR | Status: DC | PRN

## 2023-01-08 MED ORDER — PROPOFOL 10 MG/ML IV BOLUS
INTRAVENOUS | Status: AC
  Filled 2023-01-08: qty 40

## 2023-01-08 MED ORDER — LIDOCAINE HCL (CARDIAC) PF 100 MG/5ML IV SOSY
PREFILLED_SYRINGE | INTRAVENOUS | Status: DC | PRN
  Administered 2023-01-08: 80 mg via INTRAVENOUS

## 2023-01-08 MED ORDER — OXYBUTYNIN CHLORIDE 5 MG PO TABS: ORAL_TABLET | ORAL | 0 refills | Status: DC

## 2023-01-08 MED ORDER — HYDROMORPHONE HCL 1 MG/ML IJ SOLN
INTRAMUSCULAR | Status: AC
  Filled 2023-01-08: qty 1

## 2023-01-08 MED ORDER — OXYCODONE HCL 5 MG PO TABS
5.0000 mg | ORAL_TABLET | Freq: Four times a day (QID) | ORAL | Status: DC | PRN
  Administered 2023-01-08 – 2023-01-09 (×2): 5 mg via ORAL
  Filled 2023-01-08 (×2): qty 1

## 2023-01-08 SURGICAL SUPPLY — 26 items
BAG DRAIN SIEMENS DORNER NS (MISCELLANEOUS) ×2 IMPLANT
BASKET ZERO TIP 1.9FR (BASKET) ×1 IMPLANT
BRUSH SCRUB EZ 1% IODOPHOR (MISCELLANEOUS) ×2 IMPLANT
CATH URET FLEX-TIP 2 LUMEN 10F (CATHETERS) IMPLANT
CATH URETL OPEN END 6X70 (CATHETERS) IMPLANT
CNTNR URN SCR LID CUP LEK RST (MISCELLANEOUS) IMPLANT
CONT SPEC 4OZ STRL OR WHT (MISCELLANEOUS)
DRAPE UTILITY 15X26 TOWEL STRL (DRAPES) ×2 IMPLANT
FIBER LASER MOSES 200 DFL (Laser) IMPLANT
GLOVE SURG UNDER POLY LF SZ7.5 (GLOVE) ×2 IMPLANT
GOWN STRL REUS W/ TWL LRG LVL3 (GOWN DISPOSABLE) ×2 IMPLANT
GOWN STRL REUS W/ TWL XL LVL3 (GOWN DISPOSABLE) ×2 IMPLANT
GOWN STRL REUS W/TWL LRG LVL3 (GOWN DISPOSABLE) ×2
GOWN STRL REUS W/TWL XL LVL3 (GOWN DISPOSABLE) ×2
GUIDEWIRE STR DUAL SENSOR (WIRE) ×3 IMPLANT
IV NS IRRIG 3000ML ARTHROMATIC (IV SOLUTION) ×2 IMPLANT
KIT TURNOVER CYSTO (KITS) ×2 IMPLANT
PACK CYSTO AR (MISCELLANEOUS) ×2 IMPLANT
SET CYSTO W/LG BORE CLAMP LF (SET/KITS/TRAYS/PACK) ×2 IMPLANT
SHEATH NAVIGATOR HD 12/14X36 (SHEATH) IMPLANT
STENT URET 6FRX24 CONTOUR (STENTS) ×1 IMPLANT
STENT URET 6FRX26 CONTOUR (STENTS) IMPLANT
SURGILUBE 2OZ TUBE FLIPTOP (MISCELLANEOUS) ×2 IMPLANT
TRACTIP FLEXIVA PULSE ID 200 (Laser) IMPLANT
VALVE UROSEAL ADJ ENDO (VALVE) ×1 IMPLANT
WATER STERILE IRR 500ML POUR (IV SOLUTION) ×2 IMPLANT

## 2023-01-08 NOTE — Op Note (Signed)
Preoperative diagnosis:  Right distal ureteral calculus Right nephrolithiasis  Postoperative diagnosis:  Right distal ureteral calculi  Procedure:  Cystoscopy Right ureteroscopy and stone removal Right ureteral stent placement (69F/24 cm)  Right retrograde pyelography with interpretation  Surgeon: Nicki Reaper C. Alania Overholt, M.D.  Anesthesia: General  Complications: None  Intraoperative findings: Cystoscopy-bladder mucosa normal in appearance without erythema, solid or papillary lesions.  Inflammatory changes right UO None impacted right distal ureteral calculus.  The 4 mm right renal calculus noted on CT had migrated to the distal ureter just proximal to the leading stone.  Dilated right ureter which was tortuous proximally Right retrograde pyelography post procedure showed moderate hydronephrosis without filling defects  EBL: Minimal  Specimens: Calculus fragments for analysis   Indication: Tumeka Evans is a 47 y.o. female who presented to the ED yesterday morning with right renal colic with CT showing a 3 mm right UVJ stone and a 4 mm nonobstructing right lower pole stone.  She returned to the ED later in the day complaining of severe pain and subsequently admitted to the hospitalist service for pain control.  Continued with significant pain this morning and elected ureteroscopic stone removal.  After reviewing the management options for treatment, the patient elected to proceed with the above surgical procedure(s). We have discussed the potential benefits and risks of the procedure, side effects of the proposed treatment, the likelihood of the patient achieving the goals of the procedure, and any potential problems that might occur during the procedure or recuperation. Informed consent has been obtained.  Description of procedure:  The patient was taken to the operating room and general anesthesia was induced.  The patient was placed in the dorsal lithotomy position, prepped and  draped in the usual sterile fashion, and preoperative antibiotics were administered. A preoperative time-out was performed.   A 21 French cystoscope was lubricated and passed per urethra.  Panendoscopy was performed with findings as described above.  Attention was directed to the right ureteral orifice and a 0.038 Sensor wire was then advanced up the ureter under fluoroscopic guidance.  The guidewire was left in the region of the proximal ureter due to tortuosity.  A 4.5 Fr semirigid ureteroscope was then advanced into the ureter next to the guidewire and the calculus was identified as described above.  A 1.9 French 0 tip nitinol basket was placed through the ureteroscope and the leading stone was placed in the basket and removed without difficulty.  The ureteroscope was repassed and the 4 mm calculus was placed in the basket and able to be removed with minimal resistance.  The ureteroscope was advanced to the proximal ureter and due to tortuosity could not be advanced to the UPJ.  A second Sensor wire was placed to the ureteroscope and able to be negotiated into the renal pelvis.  A single channel digital flexible ureteroscope was then advanced over the working wire into the renal pelvis.  Retrograde pyelogram was performed through the ureteroscope with findings as described above.  No additional urinary calculi were noted on pyeloscopy.  The ureteroscope was then removed under direct vision.  A 69F/24 cm Contour ureteral stent was placed under fluoroscopic guidance.  The wire was then removed with an adequate stent curl noted in the renal pelvis as well as in the bladder.  The bladder was then emptied and the procedure ended.  The patient appeared to tolerate the procedure well and without complications.  After anesthetic reversal the patient was transported to the PACU in stable  condition.   Plan: The stent was left attached to a tether exiting the urethral meatus and tucked in the vagina.  She  may remove the stent on Monday, 01/11/2023 Discharge instructions were entered and postop Rx sent to pharmacy She will be contacted by our office on Monday for a 1 month postop follow-up   John Giovanni, MD

## 2023-01-08 NOTE — Progress Notes (Signed)
PROGRESS NOTE    [name] Evans  [id number] DOB: 10/20/1976 DOA: 01/07/2023 PCP: Pcp, No   Brief Narrative: 47 year old with past medical history significant for recurrent UTI, left lower extremity 1, migraines, DVT who presents complaining of left flank pain.  Found to have obstructive nephrolithiasis admitted for pain management.    Assessment & Plan:   Principal Problem:   Nephrolithiasis Active Problems:   Migraine   1-Right-sided Nephrolithiasis with hydronephrosis: -Treated with IV fluids, continue.  -started on Flomax.  -IV pain medications.  -Evaluated by urology, plan for ureteroscopy with laser lithotripsy and a stent placement  History of migraine headache: -no complaints.   History of recurrent UTI -No evidence of active infection. UA with 0-5 WBC.  -Urine culture no growth to date.   Hypokalemia;  -Replaced orally.   Anemia:  -iron normal. Ferritin 23.  Benefit form iron trial.   Incidental finding : Abnormal appearance of liver and Biliary Tree; needs out patient follow up MRI.     Estimated body mass index is 22.05 kg/m as calculated from the following:   Height as of this encounter: '5\' 5"'$  (1.651 m).   Weight as of this encounter: 60.1 kg.   DVT prophylaxis: SCD Code Status: Full code Family Communication: Disposition Plan:  Status is: Observation The patient will require care spanning > 2 midnights and should be moved to inpatient because: management of nephrolithiasis.     Consultants:  Urology   Procedures:    Antimicrobials:    Subjective: She report flank pain, severe pain.    Objective: Vitals:   01/07/23 1510 01/07/23 2047 01/07/23 2204 01/08/23 0535  BP: (!) 140/81 132/77 137/67 (!) 147/71  Pulse: 68 (!) 59 (!) 43 (!) 54  Resp: '20 18 18 18  '$ Temp: 98.1 F (36.7 C)  98.4 F (36.9 C) 97.8 F (36.6 C)  TempSrc: Oral  Oral Oral  SpO2: 100% 96% 96% 100%  Weight:   60.1 kg   Height:   '5\' 5"'$  (1.651 m)      Intake/Output Summary (Last 24 hours) at 01/08/2023 0803 Last data filed at 01/08/2023 0700 Gross per 24 hour  Intake 2123.74 ml  Output 250 ml  Net 1873.74 ml   Filed Weights   01/07/23 2204  Weight: 60.1 kg    Examination:  General exam: Appears calm and comfortable  Respiratory system: Clear to auscultation. Respiratory effort normal. Cardiovascular system: S1 & S2 heard, RRR. No JVD, murmurs, rubs, gallops or clicks. No pedal edema. Gastrointestinal system: Abdomen is nondistended, soft and nontender. No organomegaly or masses felt. Normal bowel sounds heard. Central nervous system: Alert and oriented. No focal neurological deficits. Extremities: Symmetric 5 x 5 power. Skin: No rashes, lesions or ulcers Psychiatry: Judgement and insight appear normal. Mood & affect appropriate.     Data Reviewed: I have personally reviewed following labs and imaging studies  CBC: Recent Labs  Lab 01/07/23 0350 01/08/23 0215  WBC 4.1 5.0  NEUTROABS 2.6  --   HGB 11.1* 10.2*  HCT 35.1* 31.5*  MCV 87.3 84.9  PLT 157 [id number]   Basic Metabolic Panel: Recent Labs  Lab 01/07/23 0350 01/08/23 0215  NA 135 136  K 3.6 3.4*  CL 108 109  CO2 22 22  GLUCOSE 107* 101*  BUN 12 10  CREATININE 0.76 0.69  CALCIUM 8.5* 8.1*   GFR: Estimated Creatinine Clearance: 78.2 mL/min (by C-G formula based on SCr of 0.69 mg/dL). Liver Function Tests: Recent Labs  Lab 01/07/23 0350  01/08/23 0215  AST 20 19  ALT 11 9  ALKPHOS 55 43  BILITOT 0.4 0.9  PROT 7.2 6.3*  ALBUMIN 3.8 3.4*   Recent Labs  Lab 01/07/23 0350  LIPASE 33   No results for input(s): "AMMONIA" in the last 168 hours. Coagulation Profile: No results for input(s): "INR", "PROTIME" in the last 168 hours. Cardiac Enzymes: No results for input(s): "CKTOTAL", "CKMB", "CKMBINDEX", "TROPONINI" in the last 168 hours. BNP (last 3 results) No results for input(s): "PROBNP" in the last 8760 hours. HbA1C: No results for  input(s): "HGBA1C" in the last 72 hours. CBG: No results for input(s): "GLUCAP" in the last 168 hours. Lipid Profile: No results for input(s): "CHOL", "HDL", "LDLCALC", "TRIG", "CHOLHDL", "LDLDIRECT" in the last 72 hours. Thyroid Function Tests: No results for input(s): "TSH", "T4TOTAL", "FREET4", "T3FREE", "THYROIDAB" in the last 72 hours. Anemia Panel: No results for input(s): "VITAMINB12", "FOLATE", "FERRITIN", "TIBC", "IRON", "RETICCTPCT" in the last 72 hours. Sepsis Labs: No results for input(s): "PROCALCITON", "LATICACIDVEN" in the last 168 hours.  No results found for this or any previous visit (from the past 240 hour(s)).       Radiology Studies: CT Abdomen Pelvis W Contrast  Result Date: 01/07/2023 CLINICAL DATA:  47 year old female with history of right lower quadrant abdominal pain. Nausea and difficulty urinating. EXAM: CT ABDOMEN AND PELVIS WITH CONTRAST TECHNIQUE: Multidetector CT imaging of the abdomen and pelvis was performed using the standard protocol following bolus administration of intravenous contrast. RADIATION DOSE REDUCTION: This exam was performed according to the departmental dose-optimization program which includes automated exposure control, adjustment of the mA and/or kV according to patient size and/or use of iterative reconstruction technique. CONTRAST:  160m OMNIPAQUE IOHEXOL 300 MG/ML  SOLN COMPARISON:  CT of the abdomen and pelvis 05/27/2016. FINDINGS: Lower chest: Mild scarring in the left lung base. Hepatobiliary: Again noted is very mild intra and extrahepatic biliary ductal dilatation (common bile duct measures up to 10 mm in the porta hepatis). No calcified stone within the common bile duct to suggest choledocholithiasis. Gallbladder is only mildly distended. No calcified stones identified within the gallbladder lumen. No gallbladder wall thickening or pericholecystic fluid or surrounding inflammatory changes. There is also mild diffuse periportal edema  in the liver. In the central aspect of the liver involving predominantly segments 4A and to a lesser extent 4B there appears to be focal atrophy and a hypovascular appearance, best appreciated on axial image 17 of series 2 where this region measures approximately 3.5 x 2.8 cm, of uncertain etiology and significance, but suggestive of an area of focal fibrosis. No other definite suspicious appearing hepatic mass identified. Pancreas: No pancreatic mass. No pancreatic ductal dilatation. No pancreatic or peripancreatic fluid collections or inflammatory changes. Spleen: Spleen is borderline enlarged measuring 11.5 x 5.6 x 9.3 cm (estimated splenic volume of 299 mL). Adrenals/Urinary Tract: 4 mm nonobstructive calculus in the lower pole collecting system of the right kidney. Axial image 71 of series 2 also demonstrates a 3 mm calculus at the level of the right ureterovesicular junction. This is associated with moderate proximal right hydroureteronephrosis. Left kidney and bilateral adrenal glands are otherwise normal in appearance. No left hydroureteronephrosis. Urinary bladder is otherwise unremarkable in appearance. Stomach/Bowel: The appearance of the stomach is normal. No pathologic dilatation of small bowel or colon. The appendix is not confidently identified and may be surgically absent. Regardless, there are no inflammatory changes noted adjacent to the cecum to suggest the presence of an acute appendicitis at  this time. Vascular/Lymphatic: No significant atherosclerotic disease, aneurysm or dissection noted in the abdominal or pelvic vasculature. No lymphadenopathy noted in the abdomen or pelvis. Reproductive: Uterus and right ovary are unremarkable in appearance. In the left ovary there is a well-defined 3.3 x 2.6 x 3.6 cm low-attenuation lesion which is likely a simple appearing cyst (no imaging follow-up recommended). Other: No significant volume of ascites.  No pneumoperitoneum. Musculoskeletal:  Dextroscoliosis of the lumbar spine. There are no aggressive appearing lytic or blastic lesions noted in the visualized portions of the skeleton. IMPRESSION: 1. 3 mm obstructing calculus at the right ureterovesicular junction with moderate proximal right hydroureteronephrosis. There is also a 4 mm nonobstructive calculus in the lower pole collecting system of the right kidney. 2. Abnormal appearance of the liver and the biliary tree. Specifically, there is focal atrophy and unusual hypovascular appearance in the central aspect of the liver, which appears progressive compared to the prior study from 2017, in association with mild chronic intra and extrahepatic biliary ductal dilatation. No obstructing choledocholithiasis or other cause of this is identified on today's examination. Repeat abdominal MRI with and without IV gadolinium with MRCP is suggested in the near future to better evaluate these findings. Additionally, there is mild periportal edema evident on today's examination, new compared to the prior study. Correlation with liver function tests is also recommended. 3. Additional incidental findings, as above. Electronically Signed   By: Vinnie Langton M.D.   On: 01/07/2023 06:07        Scheduled Meds:  enoxaparin (LOVENOX) injection  40 mg Subcutaneous Q24H   nicotine  14 mg Transdermal Q24H   tamsulosin  0.4 mg Oral Daily   Continuous Infusions:  dextrose 5 % and 0.9% NaCl 100 mL/hr at 01/08/23 0748     LOS: 0 days    Time spent: 35 minutes    Carlise Stofer A Talia Hoheisel, MD [facility]   If 7PM-7AM, please contact night-coverage [email or web address]  01/08/2023, 8:03 AM

## 2023-01-08 NOTE — Anesthesia Postprocedure Evaluation (Signed)
Anesthesia Post Note  Patient: [name] Evans  Procedure(s) Performed: CYSTOSCOPY WITH RETROGRADE PYELOGRAM, URETEROSCOPY AND STENT PLACEMENT  Patient location during evaluation: PACU Anesthesia Type: General Level of consciousness: awake and alert Pain management: pain level controlled Vital Signs Assessment: post-procedure vital signs reviewed and stable Respiratory status: spontaneous breathing, nonlabored ventilation and respiratory function stable Cardiovascular status: blood pressure returned to baseline and stable Postop Assessment: no apparent nausea or vomiting Anesthetic complications: no   No notable events documented.   Last Vitals:  Vitals:   01/08/23 1800 01/08/23 1816  BP: (!) 170/92 (!) 170/88  Pulse: (!) 59 (!) 58  Resp: 13 18  Temp: 37.3 C 36.9 C  SpO2: 93% 97%    Last Pain:  Vitals:   01/08/23 1816  TempSrc: Oral  PainSc: [place]

## 2023-01-08 NOTE — Anesthesia Preprocedure Evaluation (Signed)
Anesthesia Evaluation  Patient identified by MRN, date of birth, ID band Patient awake    Reviewed: Allergy & Precautions, H&P , NPO status , Patient's Chart, lab work & pertinent test results  Airway Mallampati: II  TM Distance: >3 FB Neck ROM: full    Dental  (+) Poor Dentition   Pulmonary asthma , Current Smoker and Patient abstained from smoking.   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 No date: Frequent UTI No date: Migraines No date: Scoliosis  Past Surgical History: No date: CERVICAL CONE BIOPSY No date: IVC filter, removed No date: TUBAL LIGATION No date: TYMPANOSTOMY TUBE PLACEMENT No date: Uterine ablation  BMI    Body Mass Index: 22.47 kg/m      Reproductive/Obstetrics negative OB ROS                             Anesthesia Physical Anesthesia Plan  ASA: 2  Anesthesia Plan: General ETT and General   Post-op Pain Management: Toradol IV (intra-op)* and Dilaudid IV   Induction: Intravenous  PONV Risk Score and Plan: 2 and Ondansetron, Dexamethasone and Midazolam  Airway Management Planned: Oral ETT  Additional Equipment:   Intra-op Plan:   Post-operative Plan: Extubation in OR  Informed Consent: I have reviewed the patients History and Physical, chart, labs and discussed the procedure including the risks, benefits and alternatives for the proposed anesthesia with the patient or authorized representative who has indicated his/her  understanding and acceptance.     Dental Advisory Given  Plan Discussed with: CRNA and Surgeon  Anesthesia Plan Comments:         Anesthesia Quick Evaluation

## 2023-01-08 NOTE — Transfer of Care (Signed)
Immediate Anesthesia Transfer of Care Note  Patient: [name] Evans  Procedure(s) Performed: CYSTOSCOPY WITH RETROGRADE PYELOGRAM, URETEROSCOPY AND STENT PLACEMENT  Patient Location: PACU  Anesthesia Type:General  Level of Consciousness: awake, alert , and oriented  Airway & Oxygen Therapy: Patient Spontanous Breathing and Patient connected to nasal cannula oxygen  Post-op Assessment: Report given to RN and Post -op Vital signs reviewed and stable  Post vital signs: Reviewed and stable  Last Vitals:  Vitals Value Taken Time  BP    Temp    Pulse    Resp    SpO2      Last Pain:  Vitals:   01/08/23 1452  TempSrc: Temporal  PainSc: 10-Worst pain ever      Patients Stated Pain Goal: 5 ([id number])  Complications: No notable events documented.

## 2023-01-08 NOTE — Anesthesia Procedure Notes (Signed)
Procedure Name: Intubation Date/Time: 01/08/2023 4:17 PM  Performed by: Chanetta Marshall, CRNAPre-anesthesia Checklist: Patient identified, Emergency Drugs available, Suction available and Patient being monitored Patient Re-evaluated:Patient Re-evaluated prior to induction Oxygen Delivery Method: Circle system utilized Preoxygenation: Pre-oxygenation with 100% oxygen Induction Type: IV induction Ventilation: Mask ventilation without difficulty Laryngoscope Size: McGraph and 4 Grade View: Grade I Tube type: Oral Tube size: 7.0 mm Number of attempts: 1 Airway Equipment and Method: Video-laryngoscopy Placement Confirmation: ETT inserted through vocal cords under direct vision, positive ETCO2, breath sounds checked- equal and bilateral and CO2 detector Secured at: 21 cm Tube secured with: Tape Dental Injury: Teeth and Oropharynx as per pre-operative assessment

## 2023-01-08 NOTE — Discharge Instructions (Addendum)
DISCHARGE INSTRUCTIONS FOR KIDNEY STONE/URETERAL STENT   MEDICATIONS:  1. Resume all your other meds from home.  2.  AZO (over-the-counter) can help with the burning/stinging when you urinate. 3.  Oxycodone is for moderate/severe pain, Rx prescribed by the ED 01/07/2023 and you may continue as needed 4.  Tamsulosin which was prescribed by the emergency department will help with stent/bladder irritation.   5.  Rx oxybutynin which will also help for stent/bladder irritation was sent to your pharmacy  ACTIVITY:  1. May resume regular activities in 24 hours. 2. No driving while on narcotic pain medications  3. Drink plenty of water  4. Continue to walk at home - you can still get blood clots when you are at home, so keep active, but don't over do it.  5. May return to work/school tomorrow or when you feel ready   BATHING:  1. You can shower. 2. You have a string coming from your urethra: The stent string is attached to your ureteral stent. Do not pull on this.   SIGNS/SYMPTOMS TO CALL:  Common postoperative symptoms include urinary frequency, urgency, bladder spasm and blood in the urine  Please call us if you have a fever greater than 101.5, uncontrolled nausea/vomiting, uncontrolled pain, dizziness, unable to urinate, excessively bloody urine, chest pain, shortness of breath, leg swelling, leg pain, or any other concerns or questions.   You can reach [place] at [phone].   FOLLOW-UP:  1. You will be contacted by our office for a follow-up appointment in approximately 1 month 2. You have a string attached to your stent, you may remove it on Monday morning 01/11/2023.  To do this, pull the string until the stent is completely removed. You may feel an odd sensation in your back.  You need to follow up with PCP for management of High Blood pressure.  You will need MRI liver to follow up on incidental finding of abnormal liver appearance on CT scan.

## 2023-01-08 NOTE — Plan of Care (Signed)

## 2023-01-08 NOTE — Consult Note (Signed)
Urology Consult  I have been asked to see the patient by Dr. Tyrell Antonio, for evaluation and management of nephrolithiasis.  Chief Complaint: RLQ pain, nausea  History of Present Illness: [name] Evans is a 47 y.o. year old female with an obstructing 3 mm right UVJ stone admitted for pain control.  She initially presented to the ED yesterday morning with sudden onset RLQ pain and nausea.  CT stone study revealed a 3 mm obstructing right UVJ stone with moderate right hydroureteronephrosis as well as a 4 mm nonobstructing right lower pole stone.  White count and creatinine were WNL and UA was bland.  She was treated with Toradol and discharged with Flomax for a trial of passage.  She returned to the ED later yesterday with uncontrolled pain and has since been admitted.  A.m. labs today with normal WBC count, 5.0, and stable creatinine, 0.69.  Urine culture from yesterday is pending but she is afebrile and stably hypertensive.  She reports continued poor pain control this morning.  She is lying in bed gripping her right lower abdomen.  She is tearful and reports this is her first stone episode.  She has been sipping on soda as recently as 0700 this morning but no solid food since last night.  Past Medical History:  Diagnosis Date   DVT (deep venous thrombosis) (HCC)    Frequent PVCs    Frequent UTI    Migraines    Scoliosis     Past Surgical History:  Procedure Laterality Date   CERVICAL CONE BIOPSY     IVC filter, removed     TUBAL LIGATION     TYMPANOSTOMY TUBE PLACEMENT     Uterine ablation      Home Medications:  Current Meds  Medication Sig   nicotine (NICODERM CQ - DOSED IN MG/24 HOURS) 14 mg/24hr patch Place 1 patch (14 mg total) onto the skin daily.   nicotine polacrilex (NICOTINE MINI) 4 MG lozenge Take 1 lozenge (4 mg total) by mouth as needed for smoking cessation.   oxyCODONE (ROXICODONE) 5 MG immediate release tablet Take 1 tablet (5 mg total) by mouth every 6  (six) hours as needed for up to 12 doses for severe pain or breakthrough pain.   tamsulosin (FLOMAX) 0.4 MG CAPS capsule Take 1 capsule (0.4 mg total) by mouth daily.    Allergies:  Allergies  Allergen Reactions   Fentanyl Hives    Unknown if pt is allergic to zofran of fentanyl- had burning in IV with both and rash developed after   Zofran [Ondansetron] Hives    Unknown if pt is allergic to zofran of fentanyl- had burning in IV with both and rash developed after    History reviewed. No pertinent family history.  Social History:  reports that she has been smoking e-cigarettes. She has never used smokeless tobacco. She reports that she does not drink alcohol. No history on file for drug use.  ROS: A complete review of systems was performed.  All systems are negative except for pertinent findings as noted.  Physical Exam:  Vital signs in last 24 hours: Temp:  [97.8 F (36.6 C)-98.4 F (36.9 C)] 97.8 F (36.6 C) (02/23 0815) Pulse Rate:  [43-68] 66 (02/23 0815) Resp:  [18-20] 18 (02/23 0815) BP: (132-169)/(67-81) 169/81 (02/23 0815) SpO2:  [96 %-100 %] 100 % (02/23 0815) Weight:  [60.1 kg] 60.1 kg (02/22 2204) Constitutional:  Alert and oriented, uncomfortable appearing HEENT:  AT, moist mucus membranes Cardiovascular: No clubbing,  cyanosis, or edema Respiratory: Normal respiratory effort Skin: No rashes, bruises or suspicious lesions Neurologic: Grossly intact, no focal deficits, moving all 4 extremities Psychiatric: Tearful  Laboratory Data:  Recent Labs    01/07/23 0350 01/08/23 0215  WBC 4.1 5.0  HGB 11.1* 10.2*  HCT 35.1* 31.5*   Recent Labs    01/07/23 0350 01/08/23 0215  NA 135 136  K 3.6 3.4*  CL 108 109  CO2 22 22  GLUCOSE 107* 101*  BUN 12 10  CREATININE 0.76 0.69  CALCIUM 8.5* 8.1*   Urinalysis    Component Value Date/Time   COLORURINE YELLOW (A) 01/07/2023 0515   APPEARANCEUR HAZY (A) 01/07/2023 0515   LABSPEC 1.014 01/07/2023 0515    PHURINE 6.0 01/07/2023 0515   GLUCOSEU NEGATIVE 01/07/2023 0515   HGBUR LARGE (A) 01/07/2023 0515   BILIRUBINUR NEGATIVE 01/07/2023 0515   KETONESUR NEGATIVE 01/07/2023 0515   PROTEINUR NEGATIVE 01/07/2023 0515   NITRITE NEGATIVE 01/07/2023 0515   LEUKOCYTESUR NEGATIVE 01/07/2023 0515   Results for orders placed or performed during the hospital encounter of 10/24/22  Wet prep, genital     Status: Abnormal   Collection Time: 10/24/22  4:52 PM  Result Value Ref Range Status   Yeast Wet Prep HPF POC PRESENT (A) NONE SEEN Final   Trich, Wet Prep NONE SEEN NONE SEEN Final   Clue Cells Wet Prep HPF POC NONE SEEN NONE SEEN Final   WBC, Wet Prep HPF POC <10 <10 Final   Sperm NONE SEEN  Final    Comment: Performed at [facility], [address], [place], [place] [address]    Radiologic Imaging: CT Abdomen Pelvis W Contrast  Result Date: 01/07/2023 CLINICAL DATA:  47 year old female with history of right lower quadrant abdominal pain. Nausea and difficulty urinating. EXAM: CT ABDOMEN AND PELVIS WITH CONTRAST TECHNIQUE: Multidetector CT imaging of the abdomen and pelvis was performed using the standard protocol following bolus administration of intravenous contrast. RADIATION DOSE REDUCTION: This exam was performed according to the departmental dose-optimization program which includes automated exposure control, adjustment of the mA and/or kV according to patient size and/or use of iterative reconstruction technique. CONTRAST:  135m OMNIPAQUE IOHEXOL 300 MG/ML  SOLN COMPARISON:  CT of the abdomen and pelvis 05/27/2016. FINDINGS: Lower chest: Mild scarring in the left lung base. Hepatobiliary: Again noted is very mild intra and extrahepatic biliary ductal dilatation (common bile duct measures up to 10 mm in the porta hepatis). No calcified stone within the common bile duct to suggest choledocholithiasis. Gallbladder is only mildly distended. No calcified stones identified within the  gallbladder lumen. No gallbladder wall thickening or pericholecystic fluid or surrounding inflammatory changes. There is also mild diffuse periportal edema in the liver. In the central aspect of the liver involving predominantly segments 4A and to a lesser extent 4B there appears to be focal atrophy and a hypovascular appearance, best appreciated on axial image 17 of series 2 where this region measures approximately 3.5 x 2.8 cm, of uncertain etiology and significance, but suggestive of an area of focal fibrosis. No other definite suspicious appearing hepatic mass identified. Pancreas: No pancreatic mass. No pancreatic ductal dilatation. No pancreatic or peripancreatic fluid collections or inflammatory changes. Spleen: Spleen is borderline enlarged measuring 11.5 x 5.6 x 9.3 cm (estimated splenic volume of 299 mL). Adrenals/Urinary Tract: 4 mm nonobstructive calculus in the lower pole collecting system of the right kidney. Axial image 71 of series 2 also demonstrates a 3 mm calculus at  the level of the right ureterovesicular junction. This is associated with moderate proximal right hydroureteronephrosis. Left kidney and bilateral adrenal glands are otherwise normal in appearance. No left hydroureteronephrosis. Urinary bladder is otherwise unremarkable in appearance. Stomach/Bowel: The appearance of the stomach is normal. No pathologic dilatation of small bowel or colon. The appendix is not confidently identified and may be surgically absent. Regardless, there are no inflammatory changes noted adjacent to the cecum to suggest the presence of an acute appendicitis at this time. Vascular/Lymphatic: No significant atherosclerotic disease, aneurysm or dissection noted in the abdominal or pelvic vasculature. No lymphadenopathy noted in the abdomen or pelvis. Reproductive: Uterus and right ovary are unremarkable in appearance. In the left ovary there is a well-defined 3.3 x 2.6 x 3.6 cm low-attenuation lesion which is  likely a simple appearing cyst (no imaging follow-up recommended). Other: No significant volume of ascites.  No pneumoperitoneum. Musculoskeletal: Dextroscoliosis of the lumbar spine. There are no aggressive appearing lytic or blastic lesions noted in the visualized portions of the skeleton. IMPRESSION: 1. 3 mm obstructing calculus at the right ureterovesicular junction with moderate proximal right hydroureteronephrosis. There is also a 4 mm nonobstructive calculus in the lower pole collecting system of the right kidney. 2. Abnormal appearance of the liver and the biliary tree. Specifically, there is focal atrophy and unusual hypovascular appearance in the central aspect of the liver, which appears progressive compared to the prior study from 2017, in association with mild chronic intra and extrahepatic biliary ductal dilatation. No obstructing choledocholithiasis or other cause of this is identified on today's examination. Repeat abdominal MRI with and without IV gadolinium with MRCP is suggested in the near future to better evaluate these findings. Additionally, there is mild periportal edema evident on today's examination, new compared to the prior study. Correlation with liver function tests is also recommended. 3. Additional incidental findings, as above. Electronically Signed   By: Vinnie Langton M.D.   On: 01/07/2023 06:07    Assessment & Plan:  47 year old female admitted with uncontrolled renal colic secondary to a 3 mm obstructing right UVJ stone.  With ongoing poor pain control, we discussed trial of passage versus pursuing definitive stone management today and she elected for the latter.  I offered her add-on right ureteroscopy with laser lithotripsy and stent placement with Dr. Bernardo Heater.  We discussed that if she recovers well and barring any signs of infection intraoperatively, she may be discharged postop this afternoon.  We discussed that ureteral stents can cause flank pain, bladder pain,  dysuria, urgency, frequency, and gross hematuria and that we can try to manage the symptoms with medications, but she may remain rather uncomfortable.  She expressed understanding and wishes to proceed.  Recommendations: -N.p.o., order placed.  Sips with meds okay. -Continued pain control and supportive care as needed -Add on right ureteroscopy with laser lithotripsy and stent placement with Dr. Bernardo Heater this afternoon, informed consent orders placed  Thank you for involving me in this patient's care, please page with any further questions or concerns.  Debroah Loop, PA-C 01/08/2023 9:05 AM

## 2023-01-09 ENCOUNTER — Encounter: Payer: Self-pay | Admitting: Urology

## 2023-01-09 DIAGNOSIS — N200 Calculus of kidney: Secondary | ICD-10-CM | POA: Diagnosis not present

## 2023-01-09 LAB — BASIC METABOLIC PANEL
Anion gap: 8 (ref 5–15)
BUN: 6 mg/dL (ref 6–20)
CO2: 23 mmol/L (ref 22–32)
Calcium: 8.3 mg/dL — ABNORMAL LOW (ref 8.9–10.3)
Chloride: 107 mmol/L (ref 98–111)
Creatinine, Ser: 0.6 mg/dL (ref 0.44–1.00)
GFR, Estimated: >60 mL/min (ref >60)
Glucose, Bld: 108 mg/dL — ABNORMAL HIGH (ref 70–99)
Potassium: 3.5 mmol/L (ref 3.5–5.1)
Sodium: 138 mmol/L (ref 135–145)

## 2023-01-09 LAB — VITAMIN B12: Vitamin B-12: 280 pg/mL (ref 180–914)

## 2023-01-09 MED ORDER — CYANOCOBALAMIN 500 MCG PO TABS: 500.0000 ug | ORAL_TABLET | Freq: Every day | ORAL | 0 refills | Status: DC

## 2023-01-09 MED ORDER — SENNA 8.6 MG PO TABS
1.0000 | ORAL_TABLET | Freq: Every day | ORAL | Status: DC
  Administered 2023-01-09: 8.6 mg via ORAL
  Filled 2023-01-09: qty 1

## 2023-01-09 MED ORDER — OXYBUTYNIN CHLORIDE 5 MG PO TABS: ORAL_TABLET | ORAL | 0 refills | Status: DC

## 2023-01-09 MED ORDER — SENNA 8.6 MG PO TABS: 1.0000 | ORAL_TABLET | Freq: Every day | ORAL | 0 refills | Status: DC

## 2023-01-09 MED ORDER — AMLODIPINE BESYLATE 5 MG PO TABS: 5.0000 mg | ORAL_TABLET | Freq: Every day | ORAL | 0 refills | Status: AC

## 2023-01-09 MED ORDER — AMLODIPINE BESYLATE 5 MG PO TABS
5.0000 mg | ORAL_TABLET | Freq: Every day | ORAL | Status: DC
  Administered 2023-01-09: 5 mg via ORAL
  Filled 2023-01-09: qty 1

## 2023-01-09 MED ORDER — POLYETHYLENE GLYCOL 3350 17 G PO PACK: 17.0000 g | PACK | Freq: Every day | ORAL | 0 refills | Status: DC

## 2023-01-09 MED ORDER — POLYETHYLENE GLYCOL 3350 17 G PO PACK
17.0000 g | PACK | Freq: Every day | ORAL | Status: DC
  Administered 2023-01-09: 17 g via ORAL
  Filled 2023-01-09: qty 1

## 2023-01-09 MED ORDER — OXYCODONE HCL 5 MG PO TABS: 5.0000 mg | ORAL_TABLET | Freq: Four times a day (QID) | ORAL | 0 refills | Status: DC | PRN

## 2023-01-09 NOTE — Discharge Summary (Signed)
Physician Discharge Summary   Patient: [name] Evans MRN: [id number] DOB: 1976/09/16  Admit date:     01/07/2023  Discharge date: 01/09/23  Discharge Physician: Elmarie Shiley   PCP: Pcp, No   Recommendations at discharge:    Needs follow up with Urology for further care stent. Needs follow up with PCP for further management HTN  Discharge Diagnoses: Principal Problem:   Right nephrolithiasis Active Problems:   Migraine   Right ureteral calculus   Renal colic  Resolved Problems:   * No resolved hospital problems. *  Hospital Course: 47 year old with past medical history significant for recurrent UTI, left lower extremity 1, migraines, DVT who presents complaining of left flank pain.  Found to have obstructive nephrolithiasis admitted for pain management.    Assessment and Plan: 1-Right-sided Nephrolithiasis with hydronephrosis: -Treated with IV fluids, continue.  -started on Flomax.  -IV pain medications.  Underwent cystoscopy, right ureteroscopy and stone removal, right ureteral stent placement, right retrograde pyelography with interpretation. -Patient is stable pain better than on admission.  She will be discharged on oxycodone, Flomax, oxybutynin. -She will also be discharged on bowel regimen. -She may remove the stent on 01/11/2023  History of migraine headache: -no complaints.    History of recurrent UTI -No evidence of active infection. UA with 0-5 WBC.  -Urine culture no growth to date.    Hypokalemia;  -Replaced orally.    Anemia:  -iron normal. Ferritin 23.  -B 12 280. Start B 12 supplement.  Further work up out patient.    Incidental finding : Abnormal appearance of liver and Biliary Tree; needs out patient follow up MRI.           Consultants: Urology Procedures performed: Cystoscopy Disposition: Home Diet recommendation:  Discharge Diet Orders (From admission, onward)     Start     Ordered   01/09/23 0000  Diet - low sodium heart  healthy        01/09/23 1006           Cardiac diet DISCHARGE MEDICATION: Allergies as of 01/09/2023       Reactions   Fentanyl Hives   Unknown if pt is allergic to zofran of fentanyl- had burning in IV with both and rash developed after   Zofran [ondansetron] Hives   Unknown if pt is allergic to zofran of fentanyl- had burning in IV with both and rash developed after        Medication List     TAKE these medications    amLODipine 5 MG tablet Commonly known as: NORVASC Take 1 tablet (5 mg total) by mouth daily.   bacitracin ointment Apply 1 Application topically 2 (two) times daily.   cyanocobalamin 500 MCG tablet Commonly known as: VITAMIN B12 Take 1 tablet (500 mcg total) by mouth daily.   nicotine 14 mg/24hr patch Commonly known as: NICODERM CQ - dosed in mg/24 hours Place 1 patch (14 mg total) onto the skin daily.   nicotine polacrilex 4 MG lozenge Commonly known as: Nicotine Mini Take 1 lozenge (4 mg total) by mouth as needed for smoking cessation.   oxybutynin 5 MG tablet Commonly known as: DITROPAN 1 tab tid prn frequency,urgency, bladder spasm   oxyCODONE 5 MG immediate release tablet Commonly known as: Roxicodone Take 1 tablet (5 mg total) by mouth every 6 (six) hours as needed for up to 12 doses for severe pain or breakthrough pain.   polyethylene glycol 17 g packet Commonly known as: MIRALAX / GLYCOLAX  Take 17 g by mouth daily.   senna 8.6 MG Tabs tablet Commonly known as: SENOKOT Take 1 tablet (8.6 mg total) by mouth daily.   tamsulosin 0.4 MG Caps capsule Commonly known as: FLOMAX Take 1 capsule (0.4 mg total) by mouth daily.        Follow-up Information     Stoioff, Ronda Fairly, MD Follow up in 1 week(s).   Specialty: Urology Contact information: [place] [address] [phone]                Discharge Exam: Danley Danker Weights   01/07/23 2204 01/08/23 1452  Weight: 60.1 kg 61.2 kg   General  NAD  Condition at discharge: stable  The results of significant diagnostics from this hospitalization (including imaging, microbiology, ancillary and laboratory) are listed below for reference.   Imaging Studies: DG C-Arm 1-60 Min-No Report  Result Date: 01/08/2023 Fluoroscopy was utilized by the requesting physician.  No radiographic interpretation.   CT Abdomen Pelvis W Contrast  Result Date: 01/07/2023 CLINICAL DATA:  47 year old female with history of right lower quadrant abdominal pain. Nausea and difficulty urinating. EXAM: CT ABDOMEN AND PELVIS WITH CONTRAST TECHNIQUE: Multidetector CT imaging of the abdomen and pelvis was performed using the standard protocol following bolus administration of intravenous contrast. RADIATION DOSE REDUCTION: This exam was performed according to the departmental dose-optimization program which includes automated exposure control, adjustment of the mA and/or kV according to patient size and/or use of iterative reconstruction technique. CONTRAST:  144m OMNIPAQUE IOHEXOL 300 MG/ML  SOLN COMPARISON:  CT of the abdomen and pelvis 05/27/2016. FINDINGS: Lower chest: Mild scarring in the left lung base. Hepatobiliary: Again noted is very mild intra and extrahepatic biliary ductal dilatation (common bile duct measures up to 10 mm in the porta hepatis). No calcified stone within the common bile duct to suggest choledocholithiasis. Gallbladder is only mildly distended. No calcified stones identified within the gallbladder lumen. No gallbladder wall thickening or pericholecystic fluid or surrounding inflammatory changes. There is also mild diffuse periportal edema in the liver. In the central aspect of the liver involving predominantly segments 4A and to a lesser extent 4B there appears to be focal atrophy and a hypovascular appearance, best appreciated on axial image 17 of series 2 where this region measures approximately 3.5 x 2.8 cm, of uncertain etiology and significance,  but suggestive of an area of focal fibrosis. No other definite suspicious appearing hepatic mass identified. Pancreas: No pancreatic mass. No pancreatic ductal dilatation. No pancreatic or peripancreatic fluid collections or inflammatory changes. Spleen: Spleen is borderline enlarged measuring 11.5 x 5.6 x 9.3 cm (estimated splenic volume of 299 mL). Adrenals/Urinary Tract: 4 mm nonobstructive calculus in the lower pole collecting system of the right kidney. Axial image 71 of series 2 also demonstrates a 3 mm calculus at the level of the right ureterovesicular junction. This is associated with moderate proximal right hydroureteronephrosis. Left kidney and bilateral adrenal glands are otherwise normal in appearance. No left hydroureteronephrosis. Urinary bladder is otherwise unremarkable in appearance. Stomach/Bowel: The appearance of the stomach is normal. No pathologic dilatation of small bowel or colon. The appendix is not confidently identified and may be surgically absent. Regardless, there are no inflammatory changes noted adjacent to the cecum to suggest the presence of an acute appendicitis at this time. Vascular/Lymphatic: No significant atherosclerotic disease, aneurysm or dissection noted in the abdominal or pelvic vasculature. No lymphadenopathy noted in the abdomen or pelvis. Reproductive: Uterus and right ovary  are unremarkable in appearance. In the left ovary there is a well-defined 3.3 x 2.6 x 3.6 cm low-attenuation lesion which is likely a simple appearing cyst (no imaging follow-up recommended). Other: No significant volume of ascites.  No pneumoperitoneum. Musculoskeletal: Dextroscoliosis of the lumbar spine. There are no aggressive appearing lytic or blastic lesions noted in the visualized portions of the skeleton. IMPRESSION: 1. 3 mm obstructing calculus at the right ureterovesicular junction with moderate proximal right hydroureteronephrosis. There is also a 4 mm nonobstructive calculus in the  lower pole collecting system of the right kidney. 2. Abnormal appearance of the liver and the biliary tree. Specifically, there is focal atrophy and unusual hypovascular appearance in the central aspect of the liver, which appears progressive compared to the prior study from 2017, in association with mild chronic intra and extrahepatic biliary ductal dilatation. No obstructing choledocholithiasis or other cause of this is identified on today's examination. Repeat abdominal MRI with and without IV gadolinium with MRCP is suggested in the near future to better evaluate these findings. Additionally, there is mild periportal edema evident on today's examination, new compared to the prior study. Correlation with liver function tests is also recommended. 3. Additional incidental findings, as above. Electronically Signed   By: Vinnie Langton M.D.   On: 01/07/2023 06:07    Microbiology: Results for orders placed or performed during the hospital encounter of 01/07/23  Urine Culture (for pregnant, neutropenic or urologic patients or patients with an indwelling urinary catheter)     Status: None   Collection Time: 01/07/23  5:15 AM   Specimen: Urine, Clean Catch  Result Value Ref Range Status   Specimen Description   Final    URINE, CLEAN CATCH Performed at [facility], [address] [place], [place] [address]    Special Requests   Final    NONE Performed at [facility], [address], [place], [place] [address]    Culture   Final    NO GROWTH Performed at [facility], [place] [address], [place], [place] [address]    Report Status 01/08/2023 FINAL  Final    Labs: CBC: Recent Labs  Lab 01/07/23 0350 01/08/23 0215  WBC 4.1 5.0  NEUTROABS 2.6  --   HGB 11.1* 10.2*  HCT 35.1* 31.5*  MCV 87.3 84.9  PLT 157 [id number]   Basic Metabolic Panel: Recent Labs  Lab 01/07/23 0350 01/08/23 0215 01/09/23 0450  NA 135 136 138  K 3.6 3.4* 3.5  CL 108 109 107  CO2 '22 22 23   '$ GLUCOSE 107* 101* 108*  BUN '12 10 6  '$ CREATININE 0.76 0.69 0.60  CALCIUM 8.5* 8.1* 8.3*   Liver Function Tests: Recent Labs  Lab 01/07/23 0350 01/08/23 0215  AST 20 19  ALT 11 9  ALKPHOS 55 43  BILITOT 0.4 0.9  PROT 7.2 6.3*  ALBUMIN 3.8 3.4*   CBG: No results for input(s): "GLUCAP" in the last 168 hours.  Discharge time spent: greater than 30 minutes.  Signed: Elmarie Shiley, MD [facility] 01/09/2023

## 2023-01-18 LAB — CALCULI, WITH PHOTOGRAPH (CLINICAL LAB)
Calcium Oxalate Dihydrate: 20 %
Calcium Oxalate Monohydrate: 80 %
Weight Calculi: 75 mg

## 2023-02-01 ENCOUNTER — Ambulatory Visit: Payer: Self-pay | Admitting: Urology

## 2023-02-03 ENCOUNTER — Encounter: Payer: Self-pay | Admitting: Urology

## 2024-08-02 ENCOUNTER — Emergency Department: Payer: MEDICAID

## 2024-08-02 ENCOUNTER — Emergency Department
Admission: EM | Admit: 2024-08-02 | Discharge: 2024-08-02 | Disposition: A | Discharge status: Home / Self Care | Payer: MEDICAID | Attending: Emergency Medicine | Admitting: Emergency Medicine

## 2024-08-02 ENCOUNTER — Other Ambulatory Visit: Payer: Self-pay

## 2024-08-02 DIAGNOSIS — N132 Hydronephrosis with renal and ureteral calculous obstruction: Secondary | ICD-10-CM | POA: Insufficient documentation

## 2024-08-02 DIAGNOSIS — M545 Low back pain, unspecified: Secondary | ICD-10-CM | POA: Diagnosis present

## 2024-08-02 DIAGNOSIS — R109 Unspecified abdominal pain: Secondary | ICD-10-CM

## 2024-08-02 DIAGNOSIS — Q6211 Congenital occlusion of ureteropelvic junction: Secondary | ICD-10-CM

## 2024-08-02 LAB — URINALYSIS, ROUTINE W REFLEX MICROSCOPIC
Bilirubin Urine: NEGATIVE
Glucose, UA: NEGATIVE mg/dL
Hgb urine dipstick: NEGATIVE
Ketones, ur: NEGATIVE mg/dL
Nitrite: NEGATIVE
Protein, ur: NEGATIVE mg/dL
Specific Gravity, Urine: 1.027 (ref 1.005–1.030)
Squamous Epithelial / HPF: >50 /HPF (ref 0–5)
pH: 6 (ref 5.0–8.0)

## 2024-08-02 LAB — CBC WITH DIFFERENTIAL/PLATELET
Abs Immature Granulocytes: 0.01 K/uL (ref 0.00–0.07)
Basophils Absolute: 0 K/uL (ref 0.0–0.1)
Basophils Relative: 0 %
Eosinophils Absolute: 0.3 K/uL (ref 0.0–0.5)
Eosinophils Relative: 6 %
HCT: 41.5 % (ref 36.0–46.0)
Hemoglobin: 13.6 g/dL (ref 12.0–15.0)
Immature Granulocytes: 0 %
Lymphocytes Relative: 25 %
Lymphs Abs: 1.3 K/uL (ref 0.7–4.0)
MCH: 27.9 pg (ref 26.0–34.0)
MCHC: 32.8 g/dL (ref 30.0–36.0)
MCV: 85.2 fL (ref 80.0–100.0)
Monocytes Absolute: 0.4 K/uL (ref 0.1–1.0)
Monocytes Relative: 8 %
Neutro Abs: 3 K/uL (ref 1.7–7.7)
Neutrophils Relative %: 61 %
Platelets: 207 K/uL (ref 150–400)
RBC: 4.87 MIL/uL (ref 3.87–5.11)
RDW: 12.2 % (ref 11.5–15.5)
WBC: 5 K/uL (ref 4.0–10.5)
nRBC: 0 % (ref 0.0–0.2)

## 2024-08-02 LAB — COMPREHENSIVE METABOLIC PANEL WITH GFR
ALT: 12 U/L (ref 0–44)
AST: 23 U/L (ref 15–41)
Albumin: 3.8 g/dL (ref 3.5–5.0)
Alkaline Phosphatase: 57 U/L (ref 38–126)
Anion gap: 12 (ref 5–15)
BUN: 19 mg/dL (ref 6–20)
CO2: 24 mmol/L (ref 22–32)
Calcium: 8.7 mg/dL — ABNORMAL LOW (ref 8.9–10.3)
Chloride: 101 mmol/L (ref 98–111)
Creatinine, Ser: 0.95 mg/dL (ref 0.44–1.00)
GFR, Estimated: >60 mL/min (ref >60)
Glucose, Bld: 124 mg/dL — ABNORMAL HIGH (ref 70–99)
Potassium: 4 mmol/L (ref 3.5–5.1)
Sodium: 137 mmol/L (ref 135–145)
Total Bilirubin: 0.6 mg/dL (ref 0.0–1.2)
Total Protein: 7.3 g/dL (ref 6.5–8.1)

## 2024-08-02 LAB — PREGNANCY, URINE: Preg Test, Ur: NEGATIVE

## 2024-08-02 MED ORDER — OXYCODONE-ACETAMINOPHEN 5-325 MG PO TABS: 1.0000 | ORAL_TABLET | ORAL | 0 refills | Status: DC | PRN

## 2024-08-02 MED ORDER — HYDROMORPHONE HCL 1 MG/ML IJ SOLN
0.5000 mg | Freq: Once | INTRAMUSCULAR | Status: AC
  Administered 2024-08-02: 0.5 mg via INTRAVENOUS
  Filled 2024-08-02: qty 0.5

## 2024-08-02 MED ORDER — MORPHINE SULFATE (PF) 4 MG/ML IV SOLN
4.0000 mg | Freq: Once | INTRAVENOUS | Status: AC
  Administered 2024-08-02: 4 mg via INTRAVENOUS
  Filled 2024-08-02: qty 1

## 2024-08-02 MED ORDER — ONDANSETRON HCL 4 MG/2ML IJ SOLN
4.0000 mg | Freq: Once | INTRAMUSCULAR | Status: AC
  Administered 2024-08-02: 4 mg via INTRAVENOUS
  Filled 2024-08-02: qty 2

## 2024-08-02 MED ORDER — ONDANSETRON 4 MG PO TBDP: 4.0000 mg | ORAL_TABLET | Freq: Three times a day (TID) | ORAL | 0 refills | Status: DC | PRN

## 2024-08-02 MED ORDER — LACTATED RINGERS IV BOLUS
1000.0000 mL | Freq: Once | INTRAVENOUS | Status: AC
  Administered 2024-08-02: 1000 mL via INTRAVENOUS

## 2024-08-02 NOTE — ED Notes (Signed)
 Dr. Willo aware

## 2024-08-02 NOTE — ED Provider Notes (Signed)
 [facility] Provider Note    Event Date/Time   First MD Initiated Contact with Patient 08/02/24 1935     (approximate)   History   Chief Complaint Back Pain   HPI  [name] Evans is a 48 y.o. female with past medical history of migraines, kidney stones, DVT, and scoliosis who presents to the ED complaining of back pain.  Patient reports that she has been dealing with waxing and waning pain in her upper and lower back for multiple months, has been following with [facility] for this problem.  She had an MRI of her lumbar spine that showed left-sided hydronephrosis incidentally in May of this year.  She has subsequently been scheduled for follow-up with nephrology in a couple of weeks, but pain has been getting worse recently so she decided to seek care in the ED.  She has been taking over-the-counter pain medication without significant relief.  She denies any fevers, dysuria, or hematuria.  She has been feeling nauseous but has not vomited and denies any changes in her bowel movements.     Physical Exam   Triage Vital Signs: ED Triage Vitals  Encounter Vitals Group     BP 08/02/24 1748 (!) 176/111     Girls Systolic BP Percentile --      Girls Diastolic BP Percentile --      Boys Systolic BP Percentile --      Boys Diastolic BP Percentile --      Pulse Rate 08/02/24 1748 (!) 126     Resp 08/02/24 1748 18     Temp 08/02/24 1748 98 F (36.7 C)     Temp Source 08/02/24 1748 Oral     SpO2 08/02/24 1748 98 %     Weight --      Height --      Head Circumference --      Peak Flow --      Pain Score 08/02/24 1749 9     Pain Loc --      Pain Education --      Exclude from Growth Chart --     Most recent vital signs: Vitals:   08/02/24 2154 08/02/24 2230  BP:  (!) 145/102  Pulse:  (!) 101  Resp:    Temp: 98.2 F (36.8 C) 97.9 F (36.6 C)  SpO2:  96%    Constitutional: Alert and oriented. Eyes: Conjunctivae are normal. Head: Atraumatic. Nose: No  congestion/rhinnorhea. Mouth/Throat: Mucous membranes are moist.  Cardiovascular: Normal rate, regular rhythm. Grossly normal heart sounds.  2+ radial pulses bilaterally. Respiratory: Normal respiratory effort.  No retractions. Lungs CTAB. Gastrointestinal: Soft and nontender.  Left CVA tenderness noted.  No distention. Musculoskeletal: No lower extremity tenderness nor edema.  Neurologic:  Normal speech and language. No gross focal neurologic deficits are appreciated.    ED Results / Procedures / Treatments   Labs (all labs ordered are listed, but only abnormal results are displayed) Labs Reviewed  URINALYSIS, ROUTINE W REFLEX MICROSCOPIC - Abnormal; Notable for the following components:      Result Value   Color, Urine YELLOW (*)    APPearance CLOUDY (*)    Leukocytes,Ua TRACE (*)    Bacteria, UA MANY (*)    All other components within normal limits  COMPREHENSIVE METABOLIC PANEL WITH GFR - Abnormal; Notable for the following components:   Glucose, Bld 124 (*)    Calcium 8.7 (*)    All other components within normal limits  CBC WITH  DIFFERENTIAL/PLATELET  PREGNANCY, URINE    RADIOLOGY CT renal protocol reviewed and interpreted by me with left hydronephrosis, no stone noted.  PROCEDURES:  Critical Care performed: No  Procedures   MEDICATIONS ORDERED IN ED: Medications  morphine  (PF) 4 MG/ML injection 4 mg (4 mg Intravenous Given 08/02/24 2034)  ondansetron  (ZOFRAN ) injection 4 mg (4 mg Intravenous Given 08/02/24 2040)  lactated ringers  bolus 1,000 mL (1,000 mLs Intravenous New Bag/Given 08/02/24 2040)  HYDROmorphone  (DILAUDID ) injection 0.5 mg (0.5 mg Intravenous Given 08/02/24 2229)     IMPRESSION / MDM / ASSESSMENT AND PLAN / ED COURSE  I reviewed the triage vital signs and the nursing notes.                              48 y.o. female with past medical history of migraines, kidney stones, DVT, and scoliosis presents to the ED with worsening pain in her back and  left flank over the past week.  Patient's presentation is most consistent with acute presentation with potential threat to life or bodily function.  Differential diagnosis includes, but is not limited to, kidney stone, pyelonephritis, other ureteral obstruction, AKI, anemia, electrolyte abnormality.  Patient nontoxic-appearing and in no acute distress, vital signs remarkable for tachycardia and hypertension but otherwise reassuring.  She has a benign abdominal exam but has left CVA tenderness and I did review her MRI of the lumbar spine from May which showed left-sided hydronephrosis of unclear cause.  She has not had additional imaging since then, will check CT renal protocol today.  Urinalysis appears contaminated but no signs of infection, lab results are pending at this time.  We will treat symptomatically with IV morphine  and Zofran , reassess following additional results.  CT imaging shows significant left-sided hydronephrosis to the level of the UPJ with no obstructing stone or other source of obstruction noted.  Labs without significant anemia, leukocytosis, electrolyte abnormality, or AKI.  LFTs are unremarkable.  Pain now improved following IV morphine  and Dilaudid .  Case discussed with Dr. Francisca of urology, who agrees with plan for pain control and outpatient follow-up.  Patient referred to [facility] urology in case she may be seen there sooner rather than at [facility].  She was counseled to return to the ED for new or worsening symptoms, patient agrees with plan.      FINAL CLINICAL IMPRESSION(S) / ED DIAGNOSES   Final diagnoses:  Flank pain  Hydronephrosis with ureteropelvic junction (UPJ) obstruction     Rx / DC Orders   ED Discharge Orders          Ordered    oxyCODONE -acetaminophen  (PERCOCET) 5-325 MG tablet  Every 4 hours PRN        08/02/24 2250    ondansetron  (ZOFRAN -ODT) 4 MG disintegrating tablet  Every 8 hours PRN        08/02/24 2250             Note:  This  document was prepared using Dragon voice recognition software and may include unintentional dictation errors.   Willo Dunnings, MD 08/02/24 2252

## 2024-08-02 NOTE — ED Triage Notes (Signed)
 Pt c/o pain from scoliosis and known blockage causing hydronephrosis to L kidney. Pt has c-spine surgery on 10/21 at [facility]. Pt has urology appointment 10/9.  Pt taking muscle relaxer and ibuprofen for pain but it isn't helping.

## 2024-08-10 ENCOUNTER — Telehealth: Payer: Self-pay | Admitting: Urology

## 2024-08-10 NOTE — Telephone Encounter (Signed)
 Patient previosuly followed by [facility] for kidney stones. She has a new UPJ obstruction, please offer appt asap with Dr Georganne to discuss options (per Dr. Francisca)

## 2024-08-24 ENCOUNTER — Other Ambulatory Visit: Payer: Self-pay | Admitting: Gastroenterology

## 2024-08-24 DIAGNOSIS — R160 Hepatomegaly, not elsewhere classified: Secondary | ICD-10-CM

## 2024-09-01 ENCOUNTER — Ambulatory Visit
Admission: RE | Admit: 2024-09-01 | Discharge: 2024-09-01 | Disposition: A | Discharge status: Home / Self Care | Payer: MEDICAID | Source: Ambulatory Visit | Attending: Gastroenterology | Admitting: Gastroenterology

## 2024-09-01 ENCOUNTER — Other Ambulatory Visit: Payer: Self-pay | Admitting: Gastroenterology

## 2024-09-01 DIAGNOSIS — R160 Hepatomegaly, not elsewhere classified: Secondary | ICD-10-CM | POA: Insufficient documentation

## 2024-09-01 MED ORDER — GADOBUTROL 1 MMOL/ML IV SOLN
6.0000 mL | Freq: Once | INTRAVENOUS | Status: AC | PRN
  Administered 2024-09-01: 6 mL via INTRAVENOUS

## 2024-09-10 ENCOUNTER — Other Ambulatory Visit: Payer: Self-pay

## 2024-09-10 ENCOUNTER — Emergency Department: Payer: MEDICAID

## 2024-09-10 ENCOUNTER — Emergency Department
Admission: EM | Admit: 2024-09-10 | Discharge: 2024-09-10 | Disposition: A | Discharge status: Home / Self Care | Payer: MEDICAID | Attending: Emergency Medicine | Admitting: Emergency Medicine

## 2024-09-10 DIAGNOSIS — R339 Retention of urine, unspecified: Secondary | ICD-10-CM | POA: Diagnosis not present

## 2024-09-10 DIAGNOSIS — R300 Dysuria: Secondary | ICD-10-CM | POA: Insufficient documentation

## 2024-09-10 LAB — BASIC METABOLIC PANEL WITH GFR
Anion gap: 15 (ref 5–15)
BUN: 19 mg/dL (ref 6–20)
CO2: 27 mmol/L (ref 22–32)
Calcium: 9.2 mg/dL (ref 8.9–10.3)
Chloride: 96 mmol/L — ABNORMAL LOW (ref 98–111)
Creatinine, Ser: 0.9 mg/dL (ref 0.44–1.00)
GFR, Estimated: >60 mL/min (ref >60)
Glucose, Bld: 90 mg/dL (ref 70–99)
Potassium: 3.9 mmol/L (ref 3.5–5.1)
Sodium: 138 mmol/L (ref 135–145)

## 2024-09-10 LAB — CBC
HCT: 39.4 % (ref 36.0–46.0)
Hemoglobin: 12.8 g/dL (ref 12.0–15.0)
MCH: 27.6 pg (ref 26.0–34.0)
MCHC: 32.5 g/dL (ref 30.0–36.0)
MCV: 85.1 fL (ref 80.0–100.0)
Platelets: 198 K/uL (ref 150–400)
RBC: 4.63 MIL/uL (ref 3.87–5.11)
RDW: 12.5 % (ref 11.5–15.5)
WBC: 5.6 K/uL (ref 4.0–10.5)
nRBC: 0 % (ref 0.0–0.2)

## 2024-09-10 LAB — URINALYSIS, ROUTINE W REFLEX MICROSCOPIC
Bilirubin Urine: NEGATIVE
Glucose, UA: NEGATIVE mg/dL
Hgb urine dipstick: NEGATIVE
Ketones, ur: NEGATIVE mg/dL
Leukocytes,Ua: NEGATIVE
Nitrite: NEGATIVE
Protein, ur: NEGATIVE mg/dL
Specific Gravity, Urine: 1.012 (ref 1.005–1.030)
pH: 6 (ref 5.0–8.0)

## 2024-09-10 MED ORDER — TAMSULOSIN HCL 0.4 MG PO CAPS
0.4000 mg | ORAL_CAPSULE | Freq: Once | ORAL | Status: DC
  Filled 2024-09-10: qty 1

## 2024-09-10 MED ORDER — LACTATED RINGERS IV BOLUS
1000.0000 mL | Freq: Once | INTRAVENOUS | Status: AC
  Administered 2024-09-10: 1000 mL via INTRAVENOUS

## 2024-09-10 MED ORDER — LIDOCAINE HCL URETHRAL/MUCOSAL 2 % EX GEL
1.0000 | Freq: Once | CUTANEOUS | Status: AC
  Administered 2024-09-10: 1 via URETHRAL
  Filled 2024-09-10: qty 10

## 2024-09-10 MED ORDER — ACETAMINOPHEN 500 MG PO TABS
1000.0000 mg | ORAL_TABLET | Freq: Once | ORAL | Status: DC
  Filled 2024-09-10: qty 2

## 2024-09-10 MED ORDER — DIAZEPAM 5 MG/ML IJ SOLN
5.0000 mg | Freq: Once | INTRAMUSCULAR | Status: AC
  Administered 2024-09-10: 5 mg via INTRAVENOUS
  Filled 2024-09-10: qty 2

## 2024-09-10 MED ORDER — PHENAZOPYRIDINE HCL 200 MG PO TABS
200.0000 mg | ORAL_TABLET | Freq: Once | ORAL | Status: DC
  Filled 2024-09-10: qty 1

## 2024-09-10 NOTE — ED Provider Notes (Signed)
 [facility] Provider Note    Event Date/Time   First MD Initiated Contact with Patient 09/10/24 [phone]     (approximate)  History   Chief Complaint: Post-op Problem  HPI  [name] Evans is a 48 y.o. female with a past medical history of migraines, scoliosis, presents to the emergency department for difficulty urinating.  According to the patient she had a cervical spine fusion performed on Tuesday at [facility].  Patient states she has been diagnosed with hydronephrosis of the right kidney previously is currently waiting to see a urologist.  Patient states since going home she has been experiencing difficulty urinating.  She describes this as only being able to urinate a small amount each time she urinates does states some slight discomfort when she urinates.  Patient states this morning she felt like her bladder was distended so she came to the emergency department for evaluation.  Patient denies any weakness or numbness of any arm or leg.  Physical Exam   Triage Vital Signs: ED Triage Vitals  Encounter Vitals Group     BP 09/10/24 0943 (!) 163/104     Girls Systolic BP Percentile --      Girls Diastolic BP Percentile --      Boys Systolic BP Percentile --      Boys Diastolic BP Percentile --      Pulse Rate 09/10/24 0943 100     Resp 09/10/24 0943 20     Temp 09/10/24 0943 97.9 F (36.6 C)     Temp Source 09/10/24 0943 Oral     SpO2 09/10/24 0943 99 %     Weight 09/10/24 0944 123 lb (55.8 kg)     Height 09/10/24 0944 5' 5 (1.651 m)     Head Circumference --      Peak Flow --      Pain Score 09/10/24 0946 0     Pain Loc --      Pain Education --      Exclude from Growth Chart --     Most recent vital signs: Vitals:   09/10/24 0943 09/10/24 0957  BP: (!) 163/104   Pulse: 100   Resp: 20   Temp: 97.9 F (36.6 C)   SpO2: 99% 99%    General: Awake, no distress.  CV:  Good peripheral perfusion.  Regular rate and rhythm  Resp:  Normal  effort.  Equal breath sounds bilaterally.  Abd:  No distention.  Soft, nontender.  No rebound or guarding.  ED Results / Procedures / Treatments   RADIOLOGY  MRI shows postsurgical changes there is a small epidural fluid collection most consistent with a small postoperative seroma or hematoma.  No evidence of any cord compression or abnormal cord signal.   MEDICATIONS ORDERED IN ED: Medications - No data to display   IMPRESSION / MDM / ASSESSMENT AND PLAN / ED COURSE  I reviewed the triage vital signs and the nursing notes.  Patient's presentation is most consistent with acute presentation with potential threat to life or bodily function.  Patient presents to the emergency department 5 days status post cervical fusion with increased difficulty urinating.  Patient states that has been progressively worsening since her fusion performed 5 days ago.  Patient is taking Percocet for pain control.  Given the mild discomfort when urinating differential would include urinary tract infection, given her pain control it would also include opioid-induced retention.  However given her recent cervical spine fusion I do believe  an MRI would be warranted to rule out epidural hematoma or other postoperative issue however patient has no weakness numbness no difficulty walking, etc.  We will check basic labs, will obtain a postvoid bladder scan and continue to closely monitor.  MRI has resulted with a small post operative seroma versus hematoma but no compression of the cord no abnormal cord signal do not believe this should be contributing to the patient's symptoms in any way.  Her lab work is reassuring with a normal CBC reassuring chemistry including normal renal function.  Patient's urinalysis shows no sign of infection.  Patient does have trouble urinating he was able to give us  a small sample but only had 200 or so cc of urine in her postvoid bladder scan.  We will dose IV fluids and recheck a postvoid  bladder scan.  We will start the patient on Flomax  as well as Pyridium.   After 1 L bolus patient has 780 cc in her bladder scan unable to urinate.  Again given no abnormal cord signal do not believe this is related to the patient's recent surgery besides the opioid-induced urinary retention.  Patient has no weakness or numbness otherwise.  We will place a Foley catheter and discharged on the same with a leg bag in place.  Patient will call her surgeon tomorrow to inform them of today's ER visit and urinary issues.  FINAL CLINICAL IMPRESSION(S) / ED DIAGNOSES   Urinary retention  Note:  This document was prepared using Dragon voice recognition software and may include unintentional dictation errors.   Dorothyann Drivers, MD 09/10/24 1459

## 2024-09-10 NOTE — ED Notes (Signed)
 Patient transported to MRI

## 2024-09-10 NOTE — ED Notes (Signed)
 Pt crying after catheter insertion and visibly upset. ED provider made aware and they ordered urethral lidocaine  jelly. Pt still stating she is in a lot of pain after the jelly was applied. Catheter drained 700 mL before it was removed. Pt was educated before it was removed that we placed the catheter to prevent urinary retention and that if she had it removed, she could possibly have retention again. Pt ANOx4 during education and stated she wanted the catheter removed. ED Provider aware of situation.

## 2024-09-10 NOTE — Discharge Instructions (Signed)
 You have been seen in the emergency department for difficulty urinating.  This is likely due to to your pain medication that you are taking.  Please call your surgeon tomorrow to inform them of today's ER visit and your urinary retention issues necessitating a Foley catheter.  You will need to follow-up for Foley catheter removal with your doctor in approximately 7 to 10 days.  Return to the emergency department for any weakness or numbness of any arm or leg, if your Foley catheter fails to drain urine for greater than 12 hours, abdominal pain, or any other symptom concerning to yourself.

## 2024-09-10 NOTE — ED Triage Notes (Signed)
 Pt comes with post op issue. Pt states she had her neck fused on Tuesday and had since had urinary retention. Pt also has several kidney issues. Pt states pain and burning when able to pee. Pt states lower left back pain.

## 2024-10-11 DIAGNOSIS — N1330 Unspecified hydronephrosis: Secondary | ICD-10-CM | POA: Insufficient documentation

## 2024-10-11 NOTE — Assessment & Plan Note (Addendum)
 Severe Left hydronephrosis w/ concern for UPJO   - Left cortical thinning  - Symptomatic, in context of chronic back pain, severe scoliosis  Reviewed her clinical history and CT imaging with her today, including comparison films from 2024.  This does seem to be a subacute process, severe left hydronephrosis was not visualized last year-and she does appear to have significant left cortical thinning since that period.  Unclear etiology of the hydronephrosis, question intrinsic UPJ obstruction versus any degree of extrinsic compression from scoliosis or spinal pathology.  She is currently closely followed by neurosurgery with plans for lumbar spine reconstruction.  We will continue concomitant workup for hydronephrosis with a Lasix renogram, establish split renal function, candidacy for pyeloplasty if indicated.  - Lasix renogram in 2-3 weeks, f/u with me to review - Will continue discussion re: possible left UPJO- within the context of spinal pathology and upcoming major lumbar surgery

## 2024-10-11 NOTE — Progress Notes (Signed)
   10/16/2024 1:17 PM   [name] Evans 1975-11-28 [id number]  Reason for visit: Follow up Left hydronephrosis   HPI: 48 y.o. female, initial follow up with me today, previously seen by Dr. Twylla in Feb 2024  May 2025 - incidental Left hydronephrosis noted MR lumbar spine 08/02/24 - ED eval - acute on chronic back pain, CT Stone - severe Left hydro, acute tapering at UPJ, no stone   Prior HPI: Hx of nephrolithiasis   - s/p Right URS/LL (Feb 2024) - for a 3mm UVJ stone   Hx of cervical spine fusion (Oct 2025)  - post-op urinary retention ~800cc, required Foley catheter placement   Physical Exam: BP 136/77   Pulse 90   Ht 5' 5 (1.651 m)   Wt 120 lb (54.4 kg)   BMI 19.97 kg/m    Constitutional:  Alert and oriented, No acute distress.   Laboratory Data:  Latest Reference Range & Units 05/27/16 00:55 05/11/19 22:19 09/05/22 15:40 01/07/23 03:50 01/08/23 02:15 01/09/23 04:50 08/02/24 21:57 09/10/24 10:18  Creatinine 0.44 - 1.00 mg/dL 9.00 9.43 9.42 9.23 9.30 0.60 0.95 0.90    Pertinent Imaging: I have personally viewed and interpreted the CT Stone (08/02/24) -severe left hydronephrosis with left cortical thinning, acute tapering at left UPJ, nondilated proximal ureter.  No evidence of left ureteral or renal stones.  Morphologically normal-appearing right kidney.  Significant lumbar scoliosis.  Reviewed her CT scan from 2024, moderate right hydroureteronephrosis secondary 3 mm right UVJ stone.  Left kidney had mild hydronephrosis versus extrarenal pelvis, no stone burden.  Morphologically normal left renal parenchyma    Assessment & Plan:    Hydronephrosis, left Assessment & Plan: Severe Left hydronephrosis w/ concern for UPJO   - Left cortical thinning  - Symptomatic, in context of chronic back pain, severe scoliosis  Reviewed her clinical history and CT imaging with her today, including comparison films from 2024.  This does seem to be a subacute process, severe left  hydronephrosis was not visualized last year-and she does appear to have significant left cortical thinning since that period.  Unclear etiology of the hydronephrosis, question intrinsic UPJ obstruction versus any degree of extrinsic compression from scoliosis or spinal pathology.  She is currently closely followed by neurosurgery with plans for lumbar spine reconstruction.  We will continue concomitant workup for hydronephrosis with a Lasix renogram, establish split renal function, candidacy for pyeloplasty if indicated.  - Lasix renogram in 2-3 weeks, f/u with me to review - Will continue discussion re: possible left UPJO- within the context of spinal pathology and upcoming major lumbar surgery   Orders: -     NM Renal Imaging Flow W/Pharm; Future       [name] JONELLE Skye, MD  [facility] [address], [address] [place], [place] [address] [phone]

## 2024-10-16 ENCOUNTER — Ambulatory Visit (INDEPENDENT_AMBULATORY_CARE_PROVIDER_SITE_OTHER): Payer: MEDICAID | Admitting: Urology

## 2024-10-16 ENCOUNTER — Encounter: Payer: Self-pay | Admitting: Urology

## 2024-10-16 VITALS — BP 136/77 | HR 90 | Ht 65.0 in | Wt 120.0 lb

## 2024-10-16 DIAGNOSIS — N1330 Unspecified hydronephrosis: Secondary | ICD-10-CM | POA: Diagnosis not present

## 2024-10-16 NOTE — Patient Instructions (Signed)
 [phone] for central scheduling.

## 2024-10-25 ENCOUNTER — Inpatient Hospital Stay: Admission: RE | Admit: 2024-10-25 | Discharge: 2024-10-25 | Payer: MEDICAID | Attending: Urology

## 2024-10-25 ENCOUNTER — Encounter: Payer: Self-pay | Admitting: Radiology

## 2024-10-25 DIAGNOSIS — N1330 Unspecified hydronephrosis: Secondary | ICD-10-CM | POA: Diagnosis present

## 2024-10-25 MED ORDER — FUROSEMIDE 10 MG/ML IJ SOLN
27.2000 mg | INTRAMUSCULAR | Status: DC
  Filled 2024-10-25: qty 2.7

## 2024-10-25 MED ORDER — TECHNETIUM TC 99M MERTIATIDE
4.9700 | Freq: Once | INTRAVENOUS | Status: AC | PRN
  Administered 2024-10-25: 4.97 via INTRAVENOUS

## 2024-11-10 NOTE — Assessment & Plan Note (Addendum)
 Severe Left hydronephrosis w/ concern for UPJO   - Left cortical thinning  - Symptomatic, in context of chronic back pain, severe scoliosis  Lasix  renogram 10/27/24 - split function 10% Left, 90% Right. Limited excretory eval on Left secondary to nonfunctioning kidney/poor tracer uptake.   Unclear why she developed left renal atrophy within the last year, possibly secondary to intrinsic left UPJ versus extrinsic compression from scoliosis.  Either way I would prioritize her spine surgery and follow-up with her after recovery.  We will need to reassess pain syndrome, and further distinguish if her nonfunctioning left kidney is contributing in any way.  - Expectant management for now. We may consider a Left nephrectomy in the future, particularly if her back pain syndrome is not improved with spine surgery, or if persists on the Left flank. However, unclear if her pain syndrome is true renal colic with such an essentially nonfunctioning renal unit.

## 2024-11-10 NOTE — Progress Notes (Signed)
" ° °  11/17/2024 11:34 AM   [name] Evans 27-Jan-1976 [id number]  Reason for visit: Follow up Left hydronephrosis   HPI: 48 y.o. female, initial follow up with me today, previously seen by Dr. Twylla in Feb 2024  Lasix  renogram 10/27/24 - split function 10% Left, 90% Right. Limited excretory eval on Left secondary to nonfunctioning kidney/poor tracer uptake.   May 2025 - incidental Left hydronephrosis noted MR lumbar spine 08/02/24 - ED eval - acute on chronic back pain, CT Stone - severe Left hydro, acute tapering at UPJ, no stone   Prior HPI: Hx of nephrolithiasis   - s/p Right URS/LL (Feb 2024) - for a 3mm UVJ stone   Hx of cervical spine fusion (Oct 2025)  - post-op urinary retention ~800cc, required Foley catheter placement   Physical Exam: BP (!) 142/90   Pulse (!) 102   Ht 5' 5 (1.651 m)   Wt 121 lb 3.2 oz (55 kg)   BMI 20.17 kg/m    Constitutional:  Alert and oriented, No acute distress.   Laboratory Data:  Latest Reference Range & Units 05/27/16 00:55 05/11/19 22:19 09/05/22 15:40 01/07/23 03:50 01/08/23 02:15 01/09/23 04:50 08/02/24 21:57 09/10/24 10:18  Creatinine 0.44 - 1.00 mg/dL 9.00 9.43 9.42 9.23 9.30 0.60 0.95 0.90    Pertinent Imaging: I have personally viewed and interpreted the CT Stone (08/02/24) -severe left hydronephrosis with left cortical thinning, acute tapering at left UPJ, nondilated proximal ureter.  No evidence of left ureteral or renal stones.  Morphologically normal-appearing right kidney.  Significant lumbar scoliosis.  Reviewed her CT scan from 2024, moderate right hydroureteronephrosis secondary 3 mm right UVJ stone.  Left kidney had mild hydronephrosis versus extrarenal pelvis, no stone burden.  Morphologically normal left renal parenchyma    Assessment & Plan:    Hydronephrosis, left Assessment & Plan: Severe Left hydronephrosis w/ concern for UPJO   - Left cortical thinning  - Symptomatic, in context of chronic back pain, severe  scoliosis  Lasix  renogram 10/27/24 - split function 10% Left, 90% Right. Limited excretory eval on Left secondary to nonfunctioning kidney/poor tracer uptake.   Unclear why she developed left renal atrophy within the last year, possibly secondary to intrinsic left UPJ versus extrinsic compression from scoliosis.  Either way I would prioritize her spine surgery and follow-up with her after recovery.  We will need to reassess pain syndrome, and further distinguish if her nonfunctioning left kidney is contributing in any way.  - Expectant management for now. We may consider a Left nephrectomy in the future, particularly if her back pain syndrome is not improved with spine surgery, or if persists on the Left flank. However, unclear if her pain syndrome is true renal colic with such an essentially nonfunctioning renal unit.    Acute cystitis without hematuria Assessment & Plan: History of intermittent UTIs Acute 3-5-day history of dysuria, acute LUTS  - Send urinalysis with reflex culture today -Empiric Bactrim  x 5 days, will tailor to culture results if needed         Penne JONELLE Skye, MD  [facility] [address] [place], [place] [address] [phone] "

## 2024-11-17 ENCOUNTER — Encounter: Payer: Self-pay | Admitting: Urology

## 2024-11-17 ENCOUNTER — Ambulatory Visit (INDEPENDENT_AMBULATORY_CARE_PROVIDER_SITE_OTHER): Payer: MEDICAID | Admitting: Urology

## 2024-11-17 VITALS — BP 142/90 | HR 102 | Ht 65.0 in | Wt 121.2 lb

## 2024-11-17 DIAGNOSIS — N1330 Unspecified hydronephrosis: Secondary | ICD-10-CM | POA: Diagnosis not present

## 2024-11-17 DIAGNOSIS — N390 Urinary tract infection, site not specified: Secondary | ICD-10-CM | POA: Insufficient documentation

## 2024-11-17 DIAGNOSIS — N3000 Acute cystitis without hematuria: Secondary | ICD-10-CM | POA: Diagnosis not present

## 2024-11-17 LAB — UA/M W/RFLX CULTURE, ROUTINE
Bilirubin, UA: NEGATIVE
Glucose, UA: NEGATIVE
Nitrite, UA: NEGATIVE
Specific Gravity, UA: 1.025 (ref 1.005–1.030)
Urobilinogen, Ur: 0.2 mg/dL (ref 0.2–1.0)
pH, UA: 6 (ref 5.0–7.5)

## 2024-11-17 LAB — MICROSCOPIC EXAMINATION: Epithelial Cells (non renal): >10 /HPF — AB (ref 0–10)

## 2024-11-17 MED ORDER — SULFAMETHOXAZOLE-TRIMETHOPRIM 800-160 MG PO TABS: 1.0000 | ORAL_TABLET | Freq: Two times a day (BID) | ORAL | 0 refills | Status: AC

## 2024-11-17 NOTE — Assessment & Plan Note (Signed)
 History of intermittent UTIs Acute 3-5-day history of dysuria, acute LUTS  - Send urinalysis with reflex culture today -Empiric Bactrim  x 5 days, will tailor to culture results if needed

## 2024-11-17 NOTE — Addendum Note (Signed)
 Addended byBETHA CORIE PLATER on: 11/17/2024 12:13 PM   Modules accepted: Orders

## 2024-11-21 LAB — CULTURE, URINE COMPREHENSIVE

## 2024-12-25 ENCOUNTER — Encounter: Admission: RE | Payer: Self-pay | Source: Home / Self Care

## 2024-12-25 ENCOUNTER — Ambulatory Visit: Admission: RE | Admit: 2024-12-25 | Payer: MEDICAID | Source: Home / Self Care

## 2025-02-15 ENCOUNTER — Ambulatory Visit: Payer: MEDICAID | Admitting: Urology
